# Patient Record
Sex: Female | Born: 1950 | Race: Black or African American | Hispanic: No | Smoking: Former smoker
Health system: Southern US, Community
[De-identification: ages and names within clinical notes are randomized; demographics above are authoritative.]

## PROBLEM LIST (undated history)

## (undated) DIAGNOSIS — K635 Polyp of colon: Secondary | ICD-10-CM

## (undated) DIAGNOSIS — J45990 Exercise induced bronchospasm: Secondary | ICD-10-CM

## (undated) DIAGNOSIS — K219 Gastro-esophageal reflux disease without esophagitis: Secondary | ICD-10-CM

## (undated) DIAGNOSIS — E039 Hypothyroidism, unspecified: Secondary | ICD-10-CM

## (undated) DIAGNOSIS — J189 Pneumonia, unspecified organism: Secondary | ICD-10-CM

## (undated) DIAGNOSIS — K222 Esophageal obstruction: Secondary | ICD-10-CM

## (undated) HISTORY — DX: Polyp of colon: K63.5

## (undated) HISTORY — DX: Gastro-esophageal reflux disease without esophagitis: K21.9

## (undated) HISTORY — DX: Exercise induced bronchospasm: J45.990

## (undated) HISTORY — DX: Esophageal obstruction: K22.2

## (undated) HISTORY — DX: Hypothyroidism, unspecified: E03.9

## (undated) HISTORY — DX: Pneumonia, unspecified organism: J18.9

## (undated) HISTORY — PX: TUBAL LIGATION: SHX77

## (undated) HISTORY — PX: ESOPHAGEAL DILATION: SHX303

---

## 1998-01-29 ENCOUNTER — Ambulatory Visit (HOSPITAL_COMMUNITY): Admission: RE | Admit: 1998-01-29 | Discharge: 1998-01-29 | Payer: Self-pay | Admitting: *Deleted

## 1998-02-18 ENCOUNTER — Ambulatory Visit (HOSPITAL_COMMUNITY): Admission: RE | Admit: 1998-02-18 | Discharge: 1998-02-18 | Payer: Self-pay | Admitting: *Deleted

## 1998-05-15 ENCOUNTER — Inpatient Hospital Stay (HOSPITAL_COMMUNITY): Admission: EM | Admit: 1998-05-15 | Discharge: 1998-05-18 | Payer: Self-pay | Admitting: Emergency Medicine

## 1998-08-20 ENCOUNTER — Ambulatory Visit (HOSPITAL_COMMUNITY): Admission: RE | Admit: 1998-08-20 | Discharge: 1998-08-20 | Payer: Self-pay | Admitting: *Deleted

## 1999-07-01 ENCOUNTER — Ambulatory Visit (HOSPITAL_COMMUNITY): Admission: RE | Admit: 1999-07-01 | Discharge: 1999-07-01 | Payer: Self-pay | Admitting: *Deleted

## 1999-07-01 ENCOUNTER — Encounter: Payer: Self-pay | Admitting: *Deleted

## 1999-10-25 ENCOUNTER — Other Ambulatory Visit: Admission: RE | Admit: 1999-10-25 | Discharge: 1999-10-25 | Payer: Self-pay | Admitting: *Deleted

## 2000-07-07 ENCOUNTER — Ambulatory Visit (HOSPITAL_COMMUNITY): Admission: RE | Admit: 2000-07-07 | Discharge: 2000-07-07 | Payer: Self-pay | Admitting: *Deleted

## 2000-07-07 ENCOUNTER — Encounter: Payer: Self-pay | Admitting: *Deleted

## 2001-02-16 ENCOUNTER — Other Ambulatory Visit: Admission: RE | Admit: 2001-02-16 | Discharge: 2001-02-16 | Payer: Self-pay | Admitting: *Deleted

## 2001-02-21 ENCOUNTER — Ambulatory Visit (HOSPITAL_COMMUNITY): Admission: RE | Admit: 2001-02-21 | Discharge: 2001-02-21 | Payer: Self-pay | Admitting: Gastroenterology

## 2001-02-21 ENCOUNTER — Encounter: Payer: Self-pay | Admitting: Gastroenterology

## 2002-02-07 ENCOUNTER — Encounter: Payer: Self-pay | Admitting: Internal Medicine

## 2002-02-07 ENCOUNTER — Encounter: Admission: RE | Admit: 2002-02-07 | Discharge: 2002-02-07 | Payer: Self-pay | Admitting: Internal Medicine

## 2002-07-31 ENCOUNTER — Ambulatory Visit (HOSPITAL_COMMUNITY): Admission: RE | Admit: 2002-07-31 | Discharge: 2002-07-31 | Payer: Self-pay | Admitting: *Deleted

## 2002-07-31 ENCOUNTER — Encounter: Payer: Self-pay | Admitting: *Deleted

## 2002-09-06 ENCOUNTER — Encounter (INDEPENDENT_AMBULATORY_CARE_PROVIDER_SITE_OTHER): Payer: Self-pay | Admitting: Specialist

## 2002-09-06 ENCOUNTER — Encounter: Payer: Self-pay | Admitting: Gastroenterology

## 2002-09-06 ENCOUNTER — Ambulatory Visit (HOSPITAL_COMMUNITY): Admission: RE | Admit: 2002-09-06 | Discharge: 2002-09-06 | Payer: Self-pay | Admitting: Gastroenterology

## 2002-09-18 ENCOUNTER — Other Ambulatory Visit: Admission: RE | Admit: 2002-09-18 | Discharge: 2002-09-18 | Payer: Self-pay | Admitting: *Deleted

## 2003-08-04 ENCOUNTER — Ambulatory Visit (HOSPITAL_COMMUNITY): Admission: RE | Admit: 2003-08-04 | Discharge: 2003-08-04 | Payer: Self-pay | Admitting: *Deleted

## 2003-11-20 ENCOUNTER — Other Ambulatory Visit: Admission: RE | Admit: 2003-11-20 | Discharge: 2003-11-20 | Payer: Self-pay | Admitting: *Deleted

## 2004-02-18 ENCOUNTER — Encounter: Admission: RE | Admit: 2004-02-18 | Discharge: 2004-02-18 | Payer: Self-pay | Admitting: Internal Medicine

## 2004-08-20 ENCOUNTER — Ambulatory Visit (HOSPITAL_COMMUNITY): Admission: RE | Admit: 2004-08-20 | Discharge: 2004-08-20 | Payer: Self-pay | Admitting: Internal Medicine

## 2005-01-10 ENCOUNTER — Other Ambulatory Visit: Admission: RE | Admit: 2005-01-10 | Discharge: 2005-01-10 | Payer: Self-pay | Admitting: Obstetrics and Gynecology

## 2005-01-12 ENCOUNTER — Encounter: Admission: RE | Admit: 2005-01-12 | Discharge: 2005-01-12 | Payer: Self-pay | Admitting: Internal Medicine

## 2005-01-20 ENCOUNTER — Ambulatory Visit (HOSPITAL_COMMUNITY): Admission: RE | Admit: 2005-01-20 | Discharge: 2005-01-20 | Payer: Self-pay | Admitting: *Deleted

## 2005-08-22 ENCOUNTER — Ambulatory Visit (HOSPITAL_COMMUNITY): Admission: RE | Admit: 2005-08-22 | Discharge: 2005-08-22 | Payer: Self-pay | Admitting: *Deleted

## 2005-11-30 ENCOUNTER — Encounter: Payer: Self-pay | Admitting: *Deleted

## 2005-12-20 ENCOUNTER — Emergency Department (HOSPITAL_COMMUNITY): Admission: EM | Admit: 2005-12-20 | Discharge: 2005-12-20 | Payer: Self-pay | Admitting: Emergency Medicine

## 2006-03-01 ENCOUNTER — Ambulatory Visit: Payer: Self-pay | Admitting: Pulmonary Disease

## 2006-03-29 ENCOUNTER — Ambulatory Visit: Payer: Self-pay | Admitting: Pulmonary Disease

## 2006-06-16 ENCOUNTER — Ambulatory Visit: Payer: Self-pay | Admitting: Pulmonary Disease

## 2006-08-24 ENCOUNTER — Ambulatory Visit (HOSPITAL_COMMUNITY): Admission: RE | Admit: 2006-08-24 | Discharge: 2006-08-24 | Payer: Self-pay | Admitting: Internal Medicine

## 2006-10-04 ENCOUNTER — Ambulatory Visit: Payer: Self-pay | Admitting: Pulmonary Disease

## 2006-11-06 ENCOUNTER — Ambulatory Visit: Payer: Self-pay | Admitting: Pulmonary Disease

## 2007-01-18 ENCOUNTER — Encounter: Admission: RE | Admit: 2007-01-18 | Discharge: 2007-01-18 | Payer: Self-pay | Admitting: Internal Medicine

## 2007-02-06 ENCOUNTER — Encounter: Admission: RE | Admit: 2007-02-06 | Discharge: 2007-02-06 | Payer: Self-pay | Admitting: Internal Medicine

## 2007-02-09 ENCOUNTER — Ambulatory Visit: Payer: Self-pay | Admitting: Pulmonary Disease

## 2007-02-12 ENCOUNTER — Ambulatory Visit: Payer: Self-pay | Admitting: Pulmonary Disease

## 2007-02-12 ENCOUNTER — Ambulatory Visit: Payer: Self-pay | Admitting: Internal Medicine

## 2007-02-23 ENCOUNTER — Encounter: Admission: RE | Admit: 2007-02-23 | Discharge: 2007-02-23 | Payer: Self-pay | Admitting: Internal Medicine

## 2007-02-26 ENCOUNTER — Ambulatory Visit: Payer: Self-pay | Admitting: Pulmonary Disease

## 2007-03-06 ENCOUNTER — Encounter: Admission: RE | Admit: 2007-03-06 | Discharge: 2007-03-06 | Payer: Self-pay | Admitting: Obstetrics and Gynecology

## 2007-05-04 ENCOUNTER — Ambulatory Visit: Payer: Self-pay | Admitting: Pulmonary Disease

## 2007-05-04 DIAGNOSIS — J309 Allergic rhinitis, unspecified: Secondary | ICD-10-CM | POA: Insufficient documentation

## 2007-05-04 DIAGNOSIS — M810 Age-related osteoporosis without current pathological fracture: Secondary | ICD-10-CM | POA: Insufficient documentation

## 2007-09-03 ENCOUNTER — Ambulatory Visit (HOSPITAL_COMMUNITY): Admission: RE | Admit: 2007-09-03 | Discharge: 2007-09-03 | Payer: Self-pay | Admitting: Orthopedic Surgery

## 2007-09-03 ENCOUNTER — Ambulatory Visit: Payer: Self-pay | Admitting: Pulmonary Disease

## 2007-09-03 DIAGNOSIS — K219 Gastro-esophageal reflux disease without esophagitis: Secondary | ICD-10-CM | POA: Insufficient documentation

## 2007-09-20 ENCOUNTER — Telehealth (INDEPENDENT_AMBULATORY_CARE_PROVIDER_SITE_OTHER): Payer: Self-pay | Admitting: *Deleted

## 2007-10-24 ENCOUNTER — Ambulatory Visit: Payer: Self-pay | Admitting: Gastroenterology

## 2007-11-15 ENCOUNTER — Ambulatory Visit: Payer: Self-pay | Admitting: Gastroenterology

## 2007-11-21 ENCOUNTER — Ambulatory Visit: Payer: Self-pay | Admitting: Gastroenterology

## 2007-12-14 ENCOUNTER — Telehealth (INDEPENDENT_AMBULATORY_CARE_PROVIDER_SITE_OTHER): Payer: Self-pay | Admitting: *Deleted

## 2008-01-30 ENCOUNTER — Ambulatory Visit: Payer: Self-pay | Admitting: Pulmonary Disease

## 2008-01-30 DIAGNOSIS — J449 Chronic obstructive pulmonary disease, unspecified: Secondary | ICD-10-CM | POA: Insufficient documentation

## 2008-01-31 ENCOUNTER — Telehealth (INDEPENDENT_AMBULATORY_CARE_PROVIDER_SITE_OTHER): Payer: Self-pay | Admitting: *Deleted

## 2008-02-28 ENCOUNTER — Ambulatory Visit: Payer: Self-pay | Admitting: Pulmonary Disease

## 2008-03-04 ENCOUNTER — Telehealth (INDEPENDENT_AMBULATORY_CARE_PROVIDER_SITE_OTHER): Payer: Self-pay | Admitting: *Deleted

## 2008-03-06 ENCOUNTER — Telehealth (INDEPENDENT_AMBULATORY_CARE_PROVIDER_SITE_OTHER): Payer: Self-pay | Admitting: *Deleted

## 2008-05-06 ENCOUNTER — Ambulatory Visit: Payer: Self-pay | Admitting: Pulmonary Disease

## 2008-06-17 ENCOUNTER — Telehealth: Payer: Self-pay | Admitting: Pulmonary Disease

## 2008-06-23 ENCOUNTER — Encounter: Payer: Self-pay | Admitting: Pulmonary Disease

## 2008-07-16 ENCOUNTER — Telehealth: Payer: Self-pay | Admitting: Pulmonary Disease

## 2008-07-28 ENCOUNTER — Ambulatory Visit: Payer: Self-pay | Admitting: Pulmonary Disease

## 2008-08-10 ENCOUNTER — Encounter: Payer: Self-pay | Admitting: Pulmonary Disease

## 2008-08-14 ENCOUNTER — Ambulatory Visit: Payer: Self-pay | Admitting: Pulmonary Disease

## 2008-08-29 ENCOUNTER — Ambulatory Visit: Payer: Self-pay | Admitting: Pulmonary Disease

## 2008-09-10 ENCOUNTER — Ambulatory Visit (HOSPITAL_COMMUNITY): Admission: RE | Admit: 2008-09-10 | Discharge: 2008-09-10 | Payer: Self-pay | Admitting: Internal Medicine

## 2008-09-11 ENCOUNTER — Ambulatory Visit: Payer: Self-pay | Admitting: Pulmonary Disease

## 2008-09-25 ENCOUNTER — Ambulatory Visit: Payer: Self-pay | Admitting: Pulmonary Disease

## 2008-10-06 ENCOUNTER — Telehealth: Payer: Self-pay | Admitting: Pulmonary Disease

## 2008-10-14 ENCOUNTER — Ambulatory Visit: Payer: Self-pay | Admitting: Pulmonary Disease

## 2008-10-28 ENCOUNTER — Ambulatory Visit: Payer: Self-pay | Admitting: Pulmonary Disease

## 2008-11-11 ENCOUNTER — Ambulatory Visit: Payer: Self-pay | Admitting: Pulmonary Disease

## 2008-12-04 ENCOUNTER — Ambulatory Visit: Payer: Self-pay | Admitting: Pulmonary Disease

## 2008-12-18 ENCOUNTER — Ambulatory Visit: Payer: Self-pay | Admitting: Pulmonary Disease

## 2009-01-01 ENCOUNTER — Ambulatory Visit: Payer: Self-pay | Admitting: Pulmonary Disease

## 2009-01-07 ENCOUNTER — Telehealth (INDEPENDENT_AMBULATORY_CARE_PROVIDER_SITE_OTHER): Payer: Self-pay | Admitting: *Deleted

## 2009-01-12 ENCOUNTER — Ambulatory Visit: Payer: Self-pay | Admitting: Pulmonary Disease

## 2009-02-16 ENCOUNTER — Ambulatory Visit: Payer: Self-pay | Admitting: Pulmonary Disease

## 2009-03-24 ENCOUNTER — Ambulatory Visit: Payer: Self-pay | Admitting: Pulmonary Disease

## 2009-04-14 ENCOUNTER — Telehealth: Payer: Self-pay | Admitting: Pulmonary Disease

## 2009-04-15 ENCOUNTER — Ambulatory Visit: Payer: Self-pay | Admitting: Pulmonary Disease

## 2009-04-29 ENCOUNTER — Ambulatory Visit: Payer: Self-pay | Admitting: Pulmonary Disease

## 2009-05-13 ENCOUNTER — Ambulatory Visit: Payer: Self-pay | Admitting: Pulmonary Disease

## 2009-07-08 ENCOUNTER — Encounter: Payer: Self-pay | Admitting: Pulmonary Disease

## 2009-09-08 ENCOUNTER — Ambulatory Visit: Payer: Self-pay | Admitting: Pulmonary Disease

## 2009-10-15 ENCOUNTER — Ambulatory Visit (HOSPITAL_COMMUNITY): Admission: RE | Admit: 2009-10-15 | Discharge: 2009-10-15 | Payer: Self-pay | Admitting: Obstetrics and Gynecology

## 2009-12-21 ENCOUNTER — Telehealth: Payer: Self-pay | Admitting: Pulmonary Disease

## 2009-12-31 ENCOUNTER — Ambulatory Visit: Payer: Self-pay | Admitting: Pulmonary Disease

## 2010-02-05 ENCOUNTER — Ambulatory Visit: Payer: Self-pay | Admitting: Pulmonary Disease

## 2010-02-05 ENCOUNTER — Encounter: Payer: Self-pay | Admitting: Pulmonary Disease

## 2010-02-11 ENCOUNTER — Telehealth: Payer: Self-pay | Admitting: Pulmonary Disease

## 2010-02-11 ENCOUNTER — Encounter: Payer: Self-pay | Admitting: Pulmonary Disease

## 2010-02-22 ENCOUNTER — Ambulatory Visit: Payer: Self-pay | Admitting: Pulmonary Disease

## 2010-02-24 ENCOUNTER — Telehealth: Payer: Self-pay | Admitting: Pulmonary Disease

## 2010-07-27 ENCOUNTER — Telehealth (INDEPENDENT_AMBULATORY_CARE_PROVIDER_SITE_OTHER): Payer: Self-pay | Admitting: *Deleted

## 2010-08-12 ENCOUNTER — Ambulatory Visit
Admission: RE | Admit: 2010-08-12 | Discharge: 2010-08-12 | Payer: Self-pay | Source: Home / Self Care | Attending: Pulmonary Disease | Admitting: Pulmonary Disease

## 2010-08-22 ENCOUNTER — Encounter: Payer: Self-pay | Admitting: Obstetrics and Gynecology

## 2010-08-22 ENCOUNTER — Encounter: Payer: Self-pay | Admitting: Internal Medicine

## 2010-08-31 NOTE — Miscellaneous (Signed)
Summary: Orders Update pft charges  Clinical Lists Changes  Orders: Added new Service order of Carbon Monoxide diffusing w/capacity (94720) - Signed Added new Service order of Lung Volumes (94240) - Signed Added new Service order of Spirometry (Pre & Post) (94060) - Signed 

## 2010-08-31 NOTE — Progress Notes (Signed)
Summary: samples  Phone Note Call from Patient Call back at Home Phone [phone] Call back at [phone]   Caller: Patient Call For: Arretta Toenjes Summary of Call: Needs samples of symbicort 160-4.55mcg. Initial call taken by: Darletta Moll,  Dec 21, 2009 9:26 AM  Follow-up for Phone Call        pt advised 2 samples at front. Carron Curie CMA  Dec 21, 2009 10:26 AM

## 2010-08-31 NOTE — Assessment & Plan Note (Signed)
Summary: discuss pft results/treatment/la   Visit Type:  Follow-up Primary Provider/Referring Provider:  Allena Napoleon  CC:  COPD. Patient here to discuss PFT results.  No changes in her breathin.Marland Kitchen  History of Present Illness: 60 yo female with chronic obstructive asthma, allergic rhinitis, and exercise induced bronchoconstriction.  Her IgE level was 327.6 from 02/28/08 with multiple allergens.  She did not notice much improvement after using prednisone.  She feels her breathing is about the same.  She has been using her rescue inhaler more often, but doesn't feel like it helps as much as before.  She has a cough with sputum.  She denies hemoptysis or fever.  Her sinuses have been okay.   Current Medications (verified): 1)  Spiriva Handihaler 18 Mcg  Caps (Tiotropium Bromide Monohydrate) .... Inhale Contents of 1 Capsule Once A Day 2)  Symbicort 160-4.5 Mcg/act  Aero (Budesonide-Formoterol Fumarate) .... Inhale 2 Puffs Two Times A Day 3)  Singulair 10 Mg  Tabs (Montelukast Sodium) .... Take 1 Tablet By Mouth Once A Day 4)  Proair Hfa 108 (90 Base) Mcg/act Aers (Albuterol Sulfate) .... Two Puffs Upto Four Times Per Day As Needed 5)  Prilosec 20 Mg  Cpdr (Omeprazole) .Marland Kitchen.. 1 By Mouth Two Times A Day 6)  Epipen 0.3 Mg/0.66ml (1:1000) Devi (Epinephrine Hcl (Anaphylaxis)) .... Take As Directed As Needed For Severe Allergic Reaction 7)  Multivitamins  Tabs (Multiple Vitamin) .Marland Kitchen.. 1 By Mouth Daily 8)  Synthroid .Marland Kitchen.. 1 By Mouth Daily  Allergies (verified): No Known Drug Allergies  Past History:  Past Medical History: Chronic obstructive asthma      - IgE 327.6      - PFT 02/28/08 FEV1 1.44(58%), FEV1% 48, TLC 5.25(99%), DLCO 76%      - PFT 02/05/10 FEV1 1.44(60%), FEV1% 51, TLC 5.22(99%), DLCO 78%, +BD      - Started xolair 08/14/08 and stopped 09/10 (pt intolerant of medication) Allergic rhinitis Exercise induced bronchocontriction GERD Osteoporosis Esophageal stricture Colon  polyp Pneumonia  Past Surgical History: Reviewed history from 01/30/2008 and no changes required. Esophageal dilation Tubal ligation  Vital Signs:  Patient profile:   60 year old female Height:      66 inches (167.64 cm) Weight:      180 pounds (81.82 kg) BMI:     29.16 O2 Sat:      98 % on Room air Temp:     98.1 degrees F (36.72 degrees C) oral Pulse rate:   77 / minute BP sitting:   118 / 78  (left arm) Cuff size:   regular  Vitals Entered By: Michel Bickers CMA (February 22, 2010 12:17 PM)  O2 Sat at Rest %:  98 O2 Flow:  Room air CC: COPD. Patient here to discuss PFT results.  No changes in her breathin. Comments Medications reviewed with the patient. Daytime phone verified. Michel Bickers [facility]  February 22, 2010 12:19 PM   Physical Exam  General:  normal appearance.   Nose:  no nasal discharge Mouth:  MP 2, scalloped borders, decreased AP diameter of oropharynx, low laying soft palate Neck:  no LAN Lungs:  no wheezing or rales Heart:  regular rhythm, normal rate, and no murmurs.   Extremities:  no edema Cervical Nodes:  no significant adenopathy   Impression & Recommendations:  Problem # 1:  CHRONIC OBSTRUCTIVE ASTHMA UNSPECIFIED (ICD-493.20) Will add theophylline to her current regimen.  May need to consider changing to nebulized medication if no improvement.  She is to  call if no help from theophylline or if she gets side effects.  Problem # 2:  ALLERGIC RHINITIS (ICD-477.9) Stable.  Problem # 3:  HYPERSOMNIA UNSPECIFIED (ICD-780.54)  She continues to have problems with feeling fatigued and sleepy during the day.  Will address further at her next visit.  Medications Added to Medication List This Visit: 1)  Theo-24 100 Mg Xr24h-cap (Theophylline) .... One by mouth once daily  Complete Medication List: 1)  Spiriva Handihaler 18 Mcg Caps (Tiotropium bromide monohydrate) .... Inhale contents of 1 capsule once a day 2)  Symbicort 160-4.5 Mcg/act Aero  (Budesonide-formoterol fumarate) .... Inhale 2 puffs two times a day 3)  Theo-24 100 Mg Xr24h-cap (Theophylline) .... One by mouth once daily 4)  Singulair 10 Mg Tabs (Montelukast sodium) .... Take 1 tablet by mouth once a day 5)  Proair Hfa 108 (90 Base) Mcg/act Aers (Albuterol sulfate) .... Two puffs upto four times per day as needed 6)  Prilosec 20 Mg Cpdr (Omeprazole) .Marland Kitchen.. 1 by mouth two times a day 7)  Epipen 0.3 Mg/0.32ml (1:1000) Devi (Epinephrine hcl (anaphylaxis)) .... Take as directed as needed for severe allergic reaction 8)  Multivitamins Tabs (Multiple vitamin) .Marland Kitchen.. 1 by mouth daily 9)  Synthroid  .Marland Kitchen.. 1 by mouth daily  Other Orders: Est. Patient Level III (16109)  Patient Instructions: 1)  Theo-24 one pill once daily  2)  Follow up in 4 months Prescriptions: THEO-24 100 MG XR24H-CAP (THEOPHYLLINE) one by mouth once daily  #30 x 6   Entered and Authorized by:   Coralyn Helling MD   Signed by:   Coralyn Helling MD on 02/22/2010   Method used:   Electronically to        [facility] #[id number]* (retail)       [address]       [place], [place]  [address]       Ph: [phone]       Fax: [phone]   RxID:   [id number]

## 2010-08-31 NOTE — Progress Notes (Signed)
Summary: request for med change  Phone Note From Pharmacy   Caller: [facility] #[id number]* Call For: Dr. Craige Cotta  Summary of Call: Received fax from pharmacy with the following message: "***Mary [name] has requested a less expensive alternative to Theo-24. She inquired about generic theophylline 12 hour. She is willing to take more tabs/day to save money. Please reply at your earliest convenience. Thank you.***"  For Rx Theo-24 100mg  ER capsules, Take 1 capsule by mouth once a day   [facility] [address] phone [phone], fax [phone] Initial call taken by: Zackery Barefoot CMA,  February 24, 2010 12:18 PM  Follow-up for Phone Call        Please change to theophylline 100mg  by mouth two times a day, dispense 60 with 4 refills. Follow-up by: Coralyn Helling MD,  March 03, 2010 8:47 AM    New/Updated Medications: THEOPHYLLINE CR 100 MG XR12H-TAB (THEOPHYLLINE) one by mouth two times a day Prescriptions: THEOPHYLLINE CR 100 MG XR12H-TAB (THEOPHYLLINE) one by mouth two times a day  #60 x 4   Entered and Authorized by:   Coralyn Helling MD   Signed by:   Coralyn Helling MD on 03/03/2010   Method used:   Electronically to        [facility] #[id number]* (retail)       [address]       [place], [place]  [address]       Ph: [phone]       Fax: [phone]   RxID:   [id number]

## 2010-08-31 NOTE — Assessment & Plan Note (Signed)
Summary: rov/apc   Primary Provider/Referring Provider:  Allena Napoleon  CC:  Asthma. Patient states she is having no problems with her breathing.Marland Kitchen  History of Present Illness: I saw Mary [name] today in follow up for her chronic obstructive asthma, allergic rhinitis, and exercise induced bronchoconstriction.  Her IgE level was 327.6 from 02/28/08 with multiple allergens.  She stopped xolair in September.  She was not consistent with her dosing prior to this.  She felt that her breathing was actually worse when she got her xolair shots.  She has been doing reasonably well otherwise.  She uses her proair about 4 or 5 times per week.  She has a cough with clear sputum.  Her sinuses are okay.  She intermittently uses mucinex for chest congestion.  Current Medications (verified): 1)  Spiriva Handihaler 18 Mcg  Caps (Tiotropium Bromide Monohydrate) .... Inhale Contents of 1 Capsule Once A Day 2)  Symbicort 160-4.5 Mcg/act  Aero (Budesonide-Formoterol Fumarate) .... Inhale 2 Puffs Two Times A Day 3)  Singulair 10 Mg  Tabs (Montelukast Sodium) .... Take 1 Tablet By Mouth Once A Day 4)  Proair Hfa 108 (90 Base) Mcg/act Aers (Albuterol Sulfate) .... Two Puffs Upto Four Times Per Day As Needed 5)  Prilosec 20 Mg  Cpdr (Omeprazole) .Marland Kitchen.. 1 By Mouth Two Times A Day 6)  Forteo 600 Mcg/2.32ml Soln (Teriparatide (Recombinant)) .Marland Kitchen.. 1 Injection At Bedtime 7)  Epipen 0.3 Mg/0.55ml (1:1000) Devi (Epinephrine Hcl (Anaphylaxis)) .... Take As Directed As Needed For Severe Allergic Reaction 8)  Multivitamins  Tabs (Multiple Vitamin) .Marland Kitchen.. 1 By Mouth Daily 9)  Synthroid .Marland Kitchen.. 1 By Mouth Daily  Allergies (verified): No Known Drug Allergies  Past History:  Past Medical History: Chronic obstructive asthma      - IgE 327.6      - PFT 02/28/08 FEV1 1.44 (58%), FVC 3.00 (90%), FEV1% 48, TLC 5.25 (99%), DLCO 76%      - Started xolair 08/14/08 and stopped 09/10 (pt intolerant of medication) Allergic  rhinitis Exercise induced bronchocontriction GERD Osteoporosis Esophageal stricture Colon polyp Pneumonia  Past Surgical History: Reviewed history from 01/30/2008 and no changes required. Esophageal dilation Tubal ligation  Vital Signs:  Patient profile:   60 year old female Height:      66 inches (167.64 cm) Weight:      178.25 pounds (81.02 kg) BMI:     28.87 O2 Sat:      100 % on Room air Temp:     97.8 degrees F (36.56 degrees C) oral Pulse rate:   74 / minute BP sitting:   116 / 80  (left arm) Cuff size:   regular  Vitals Entered By: Michel Bickers CMA (September 08, 2009 3:43 PM)  O2 Sat at Rest %:  100 O2 Flow:  Room air  Physical Exam  General:  normal appearance.   Nose:  no nasal discharge Mouth:  MP 2, scalloped borders, decreased AP diameter of oropharynx, low laying soft palate Neck:  no LAN Lungs:  no wheezing or rales Heart:  regular rhythm, normal rate, and no murmurs.   Abdomen:  thin, soft, nontender Extremities:  no edema   Impression & Recommendations:  Problem # 1:  CHRONIC OBSTRUCTIVE ASTHMA UNSPECIFIED (ICD-493.20) She has not been able to tolerate xolair.  Will continue her current inhaler regimen.  Will change her to ventolin as this has a dose counter, so she can keep track of how much medicine she has left.  Medications Added to  Medication List This Visit: 1)  Multivitamins Tabs (Multiple vitamin) .Marland Kitchen.. 1 by mouth daily 2)  Synthroid  .Marland Kitchen.. 1 by mouth daily 3)  Ventolin Hfa 108 (90 Base) Mcg/act Aers (Albuterol sulfate) .... Two puffs up to four times per day as needed  Complete Medication List: 1)  Spiriva Handihaler 18 Mcg Caps (Tiotropium bromide monohydrate) .... Inhale contents of 1 capsule once a day 2)  Symbicort 160-4.5 Mcg/act Aero (Budesonide-formoterol fumarate) .... Inhale 2 puffs two times a day 3)  Singulair 10 Mg Tabs (Montelukast sodium) .... Take 1 tablet by mouth once a day 4)  Proair Hfa 108 (90 Base) Mcg/act Aers  (Albuterol sulfate) .... Two puffs upto four times per day as needed 5)  Prilosec 20 Mg Cpdr (Omeprazole) .Marland Kitchen.. 1 by mouth two times a day 6)  Forteo 600 Mcg/2.37ml Soln (Teriparatide (recombinant)) .Marland Kitchen.. 1 injection at bedtime 7)  Epipen 0.3 Mg/0.51ml (1:1000) Devi (Epinephrine hcl (anaphylaxis)) .... Take as directed as needed for severe allergic reaction 8)  Multivitamins Tabs (Multiple vitamin) .Marland Kitchen.. 1 by mouth daily 9)  Synthroid  .Marland Kitchen.. 1 by mouth daily 10)  Ventolin Hfa 108 (90 Base) Mcg/act Aers (Albuterol sulfate) .... Two puffs up to four times per day as needed  Other Orders: Est. Patient Level III (16109)  Patient Instructions: 1)  Change proair to ventolin 2 puffs up to four times per day 2)  Follow up in 6 months Prescriptions: VENTOLIN HFA 108 (90 BASE) MCG/ACT AERS (ALBUTEROL SULFATE) two puffs up to four times per day as needed  #1 x 6   Entered and Authorized by:   Coralyn Helling MD   Signed by:   Coralyn Helling MD on 09/08/2009   Method used:   Electronically to        [facility] #[id number]* (retail)       [address]       [place], [place]  [address]       Ph: [phone]       Fax: [phone]   RxID:   [id number] SINGULAIR 10 MG  TABS (MONTELUKAST SODIUM) Take 1 tablet by mouth once a day  #30 x 6   Entered and Authorized by:   Coralyn Helling MD   Signed by:   Coralyn Helling MD on 09/08/2009   Method used:   Electronically to        [facility] #[id number]* (retail)       [address]       [place], [place]  [address]       Ph: [phone]       Fax: [phone]   RxID:   [id number] SYMBICORT 160-4.5 MCG/ACT  AERO (BUDESONIDE-FORMOTEROL FUMARATE) Inhale 2 puffs two times a day  #1 x 6   Entered and Authorized by:   Coralyn Helling MD   Signed by:   Coralyn Helling MD on 09/08/2009   Method used:   Electronically to        [facility] #[id number]* (retail)       [address]  [address]       [place], [place]  [address]       Ph: [phone]       Fax: [phone]   RxID:   [id number] SPIRIVA HANDIHALER 18 MCG  CAPS (TIOTROPIUM BROMIDE MONOHYDRATE) Inhale  contents of 1 capsule once a day  #30 x 6   Entered and Authorized by:   Coralyn Helling MD   Signed by:   Coralyn Helling MD on 09/08/2009   Method used:   Electronically to        [facility] #[id number]* (retail)       [address]       [place], [place]  [address]       Ph: [phone]       Fax: [phone]   RxID:   [id number]

## 2010-08-31 NOTE — Miscellaneous (Signed)
Summary: Pulmonary function test   Pulmonary Function Test Date: 02/05/2010 Height (in.): 66 Gender: Female  Pre-Spirometry FVC    Value: 2.56 L/min   Pred: 3.27 L/min     % Pred: 78 % FEV1    Value: 1.33 L     Pred: 2.42 L     % Pred: 55 % FEV1/FVC  Value: 52 %     Pred: 73 %     % Pred: . % FEF 25-75  Value: 0.26 L/min   Pred: 2.68 L/min     % Pred: 10 %  Post-Spirometry FVC    Value: 2.84 L/min   Pred: 3.27 L/min     % Pred: 87 % FEV1    Value: 1.44 L     Pred: 2.42 L     % Pred: 60 % FEV1/FVC  Value: 51 %     Pred: 73 %     % Pred: . % FEF 25-75  Value: 0.36 L/min   Pred: 2.68 L/min     % Pred: 13 %  Lung Volumes TLC    Value: 5.22 L   % Pred: 99 % RV    Value: 2.67 L   % Pred: 132 % DLCO    Value: 18.0 %   % Pred: 78 % DLCO/VA  Value: 4.17 %   % Pred: 113 %  Comments: Moderate obstruction.  Positive bronchodilator response.  Increased RV suggestive of airtrapping.  Otherwise normal lung volumes.  Mild diffusion defect.  Clinical Lists Changes  Observations: Added new observation of PFT COMMENTS: Moderate obstruction.  Positive bronchodilator response.  Increased RV suggestive of airtrapping.  Otherwise normal lung volumes.  Mild diffusion defect. (02/05/2010 15:00) Added new observation of DLCO/VA%EXP: 113 % (02/05/2010 15:00) Added new observation of DLCO/VA: 4.17 % (02/05/2010 15:00) Added new observation of DLCO % EXPEC: 78 % (02/05/2010 15:00) Added new observation of DLCO: 18.0 % (02/05/2010 15:00) Added new observation of RV % EXPECT: 132 % (02/05/2010 15:00) Added new observation of RV: 2.67 L (02/05/2010 15:00) Added new observation of TLC % EXPECT: 99 % (02/05/2010 15:00) Added new observation of TLC: 5.22 L (02/05/2010 15:00) Added new observation of FEF2575%EXPS: 13 % (02/05/2010 15:00) Added new observation of PSTFEF25/75P: 2.68  (02/05/2010 15:00) Added new observation of PSTFEF25/75%: 0.36 L/min (02/05/2010 15:00) Added new observation of PSTFEV1/FCV%: .  % (02/05/2010 15:00) Added new observation of FEV1FVCPRDPS: 73 % (02/05/2010 15:00) Added new observation of PSTFEV1/FVC: 51 % (02/05/2010 15:00) Added new observation of POSTFEV1%PRD: 60 % (02/05/2010 15:00) Added new observation of FEV1PRDPST: 2.42 L (02/05/2010 15:00) Added new observation of POST FEV1: 1.44 L/min (02/05/2010 15:00) Added new observation of POST FVC%EXP: 87 % (02/05/2010 15:00) Added new observation of FVCPRDPST: 3.27 L/min (02/05/2010 15:00) Added new observation of POST FVC: 2.84 L (02/05/2010 15:00) Added new observation of FEF % EXPEC: 10 % (02/05/2010 15:00) Added new observation of FEF25-75%PRE: 2.68 L/min (02/05/2010 15:00) Added new observation of FEF 25-75%: 0.26 L/min (02/05/2010 15:00) Added new observation of FEV1/FVC%EXP: . % (02/05/2010 15:00) Added new observation of FEV1/FVC PRE: 73 % (02/05/2010 15:00) Added new observation of FEV1/FVC: 52 % (02/05/2010 15:00) Added new observation of FEV1 % EXP: 55 % (02/05/2010 15:00) Added new observation of FEV1 PREDICT: 2.42 L (02/05/2010 15:00) Added new observation of FEV1: 1.33 L (02/05/2010 15:00) Added new observation of FVC % EXPECT: 78 % (02/05/2010 15:00) Added new observation of FVC PREDICT: 3.27 L (02/05/2010 15:00) Added new observation of FVC: 2.56 L (02/05/2010  15:00) Added new observation of PFT HEIGHT: 66  (02/05/2010 15:00) Added new observation of PFT DATE: 02/05/2010  (02/05/2010 15:00)

## 2010-08-31 NOTE — Assessment & Plan Note (Signed)
Summary: rov after pft ///kp   Visit Type:  Follow-up Primary Provider/Referring Provider:  Allena Napoleon  CC:  Asthma follow-up with PFT. No complaints today.Marland Kitchen  History of Present Illness: 60 yo female with chronic obstructive asthma, allergic rhinitis, and exercise induced bronchoconstriction.  Her IgE level was 327.6 from 02/28/08 with multiple allergens.    Current Medications (verified): 1)  Spiriva Handihaler 18 Mcg  Caps (Tiotropium Bromide Monohydrate) .... Inhale Contents of 1 Capsule Once A Day 2)  Symbicort 160-4.5 Mcg/act  Aero (Budesonide-Formoterol Fumarate) .... Inhale 2 Puffs Two Times A Day 3)  Singulair 10 Mg  Tabs (Montelukast Sodium) .... Take 1 Tablet By Mouth Once A Day 4)  Proair Hfa 108 (90 Base) Mcg/act Aers (Albuterol Sulfate) .... Two Puffs Upto Four Times Per Day As Needed 5)  Prilosec 20 Mg  Cpdr (Omeprazole) .Marland Kitchen.. 1 By Mouth Two Times A Day 6)  Epipen 0.3 Mg/0.23ml (1:1000) Devi (Epinephrine Hcl (Anaphylaxis)) .... Take As Directed As Needed For Severe Allergic Reaction 7)  Multivitamins  Tabs (Multiple Vitamin) .Marland Kitchen.. 1 By Mouth Daily 8)  Synthroid .Marland Kitchen.. 1 By Mouth Daily  Allergies (verified): No Known Drug Allergies  Vital Signs:  Patient profile:   60 year old female Height:      66 inches (167.64 cm) Weight:      180 pounds (81.82 kg) BMI:     29.16 O2 Sat:      98 % on Room air Temp:     97.7 degrees F (36.50 degrees C) oral Pulse rate:   71 / minute BP sitting:   120 / 78  (left arm) Cuff size:   regular  Vitals Entered By: Michel Bickers CMA (February 05, 2010 4:05 PM)  O2 Sat at Rest %:  98 O2 Flow:  Room air CC: Asthma follow-up with PFT. No complaints today. Comments Medications reviewed. Daytime phone verified.Michel Bickers [facility]  February 05, 2010 4:10 PM    Impression & Recommendations:  Problem # 1:  CHRONIC OBSTRUCTIVE ASTHMA UNSPECIFIED (ICD-493.20) Pt could not wait for evaluation as she had other committment.  Will contact her once  PFT is reviewed.  Complete Medication List: 1)  Spiriva Handihaler 18 Mcg Caps (Tiotropium bromide monohydrate) .... Inhale contents of 1 capsule once a day 2)  Symbicort 160-4.5 Mcg/act Aero (Budesonide-formoterol fumarate) .... Inhale 2 puffs two times a day 3)  Singulair 10 Mg Tabs (Montelukast sodium) .... Take 1 tablet by mouth once a day 4)  Proair Hfa 108 (90 Base) Mcg/act Aers (Albuterol sulfate) .... Two puffs upto four times per day as needed 5)  Prilosec 20 Mg Cpdr (Omeprazole) .Marland Kitchen.. 1 by mouth two times a day 6)  Epipen 0.3 Mg/0.39ml (1:1000) Devi (Epinephrine hcl (anaphylaxis)) .... Take as directed as needed for severe allergic reaction 7)  Multivitamins Tabs (Multiple vitamin) .Marland Kitchen.. 1 by mouth daily 8)  Synthroid  .Marland Kitchen.. 1 by mouth daily  Other Orders: No Charge Patient Arrived (NCPA0) (NCPA0)

## 2010-08-31 NOTE — Progress Notes (Signed)
Summary: results  on PFTS  Phone Note Call from Patient Call back at [phone] or [phone]   Caller: Patient Call For: Jamill Wetmore Summary of Call: calling for pft results Initial call taken by: Rickard Patience,  February 11, 2010 10:58 AM  Follow-up for Phone Call        pt had PFTs scheduled for 02-05-2010 at 3pm.  Pt was supposed to see VS at 4pm to discuss these results but couldn't stay for the appt.  Pt calling today for these results.  Pt aware VS out of office this week.  Will forward message to VS to address. Arman Filter LPN  February 11, 2010 11:11 AM   Additional Follow-up for Phone Call Additional follow up Details #1::        Attempted to call pt at both listed numbers.  Was connected to voicemail with both numbers.  Will ask triage to contact pt to arrange for time and number that she can be reached at.  Preferrably she should have her appointment rescheduled to discuss PFT and treatment plan further. Additional Follow-up by: Coralyn Helling MD,  February 11, 2010 3:07 PM    Additional Follow-up for Phone Call Additional follow up Details #2::    called and spoke with pt and appt made for 7-25 at 11:45 to review results of pft and discuss treatment plan.   Randell Loop CMA  February 11, 2010 3:14 PM

## 2010-08-31 NOTE — Assessment & Plan Note (Signed)
Summary: rov/jd   Primary Provider/Referring Provider:  Allena Napoleon  CC:  Acute Visit.  Pt states she is having "shallow breathing" all the time, chest congestion, and prod cough "for months."  Pt states she does not know color or mucus.  .  History of Present Illness: 60 yo female with chronic obstructive asthma, allergic rhinitis, and exercise induced bronchoconstriction.  Her IgE level was 327.6 from 02/28/08 with multiple allergens.  She feels her breathing is getting more shallow, and she gets winded more easily with exertion.  She has a cough and chest congestion.  She is not having much wheeze.  Her sinuses and throat are okay.  She denies fever, or reflux.  She is not having leg swelling or gland swelling.  She is using her ventolin four times per day, and this helps.  Current Medications (verified): 1)  Spiriva Handihaler 18 Mcg  Caps (Tiotropium Bromide Monohydrate) .... Inhale Contents of 1 Capsule Once A Day 2)  Symbicort 160-4.5 Mcg/act  Aero (Budesonide-Formoterol Fumarate) .... Inhale 2 Puffs Two Times A Day 3)  Singulair 10 Mg  Tabs (Montelukast Sodium) .... Take 1 Tablet By Mouth Once A Day 4)  Proair Hfa 108 (90 Base) Mcg/act Aers (Albuterol Sulfate) .... Two Puffs Upto Four Times Per Day As Needed 5)  Prilosec 20 Mg  Cpdr (Omeprazole) .Marland Kitchen.. 1 By Mouth Two Times A Day 6)  Epipen 0.3 Mg/0.18ml (1:1000) Devi (Epinephrine Hcl (Anaphylaxis)) .... Take As Directed As Needed For Severe Allergic Reaction 7)  Multivitamins  Tabs (Multiple Vitamin) .Marland Kitchen.. 1 By Mouth Daily 8)  Synthroid .Marland Kitchen.. 1 By Mouth Daily  Allergies (verified): No Known Drug Allergies  Past History:  Past Medical History: Reviewed history from 09/08/2009 and no changes required. Chronic obstructive asthma      - IgE 327.6      - PFT 02/28/08 FEV1 1.44 (58%), FVC 3.00 (90%), FEV1% 48, TLC 5.25 (99%), DLCO 76%      - Started xolair 08/14/08 and stopped 09/10 (pt intolerant of medication) Allergic  rhinitis Exercise induced bronchocontriction GERD Osteoporosis Esophageal stricture Colon polyp Pneumonia  Past Surgical History: Reviewed history from 01/30/2008 and no changes required. Esophageal dilation Tubal ligation  Vital Signs:  Patient profile:   60 year old female Height:      66 inches Weight:      177 pounds BMI:     28.67 O2 Sat:      97 % on Room air Temp:     98.1 degrees F oral Pulse rate:   79 / minute BP sitting:   110 / 76  (right arm) Cuff size:   regular  Vitals Entered By: Gweneth Dimitri RN (December 31, 2009 11:16 AM)  O2 Flow:  Room air CC: Acute Visit.  Pt states she is having "shallow breathing" all the time, chest congestion, prod cough "for months."  Pt states she does not know color or mucus.   Comments Medications reviewed with patient Daytime contact number verified with patient. Gweneth Dimitri RN  December 31, 2009 11:17 AM    Physical Exam  General:  normal appearance.   Nose:  no nasal discharge Mouth:  MP 2, scalloped borders, decreased AP diameter of oropharynx, low laying soft palate Neck:  no LAN Lungs:  no wheezing or rales Heart:  regular rhythm, normal rate, and no murmurs.   Extremities:  no edema Cervical Nodes:  no significant adenopathy   Impression & Recommendations:  Problem # 1:  CHRONIC  OBSTRUCTIVE ASTHMA UNSPECIFIED (ICD-493.20) She has worsening symptoms of dyspnea.  My suspicion is that this is related to her asthma.  Will repeat her chest xray and PFT to exclude any other cause that could be contributing to her dyspnea symptoms.  She has been intolerate of xolair.  I will give her a short trial of prednisone.  I explained that a short course of prednisone should not have too much affect on her osteoporosis.  She is to continue her other inhalers, and singulair.  It may also be worthwhile to consider changing her to nebulized medication to see if changing the routine of medication administration will improve her symptom  control.  I don't think she needs antibiotics at this time.  May need to also consider referral to allergist.  Problem # 2:  HYPERSOMNIA UNSPECIFIED (ICD-780.54) She continues to have problems with feeling fatigued and sleepy during the day.  Will address further at her next visit.  Medications Added to Medication List This Visit: 1)  Prednisone 10 Mg Tabs (Prednisone) .... 3 pills for 2 days, 2 pills for 2 days, 1 pill for 2 days, 1/2 pill for 2 days  Complete Medication List: 1)  Spiriva Handihaler 18 Mcg Caps (Tiotropium bromide monohydrate) .... Inhale contents of 1 capsule once a day 2)  Symbicort 160-4.5 Mcg/act Aero (Budesonide-formoterol fumarate) .... Inhale 2 puffs two times a day 3)  Singulair 10 Mg Tabs (Montelukast sodium) .... Take 1 tablet by mouth once a day 4)  Proair Hfa 108 (90 Base) Mcg/act Aers (Albuterol sulfate) .... Two puffs upto four times per day as needed 5)  Prilosec 20 Mg Cpdr (Omeprazole) .Marland Kitchen.. 1 by mouth two times a day 6)  Epipen 0.3 Mg/0.74ml (1:1000) Devi (Epinephrine hcl (anaphylaxis)) .... Take as directed as needed for severe allergic reaction 7)  Multivitamins Tabs (Multiple vitamin) .Marland Kitchen.. 1 by mouth daily 8)  Synthroid  .Marland Kitchen.. 1 by mouth daily 9)  Prednisone 10 Mg Tabs (Prednisone) .... 3 pills for 2 days, 2 pills for 2 days, 1 pill for 2 days, 1/2 pill for 2 days  Other Orders: Est. Patient Level IV (01027) Prescription Created Electronically [phone]) Full Pulmonary Function Test (PFT) T-2 View CXR (71020TC)  Patient Instructions: 1)  Prednisone 10 mg pill: 3 pills for 2 days, 2 pills for 2 days, 1 pill for 2 days, 1/2 pill for 2 days 2)  Chest xray today 3)  Will schedule breathing test (PFT) 4)  Follow up in 3 to 4 weeks Prescriptions: PREDNISONE 10 MG TABS (PREDNISONE) 3 pills for 2 days, 2 pills for 2 days, 1 pill for 2 days, 1/2 pill for 2 days  #13 x 0   Entered and Authorized by:   Coralyn Helling MD   Signed by:   Coralyn Helling MD on 12/31/2009    Method used:   Electronically to        [facility] #[id number]* (retail)       [address]       [place], [place]  [address]       Ph: [phone]       Fax: [phone]   RxID:   [id number]

## 2010-09-02 NOTE — Assessment & Plan Note (Signed)
Summary: pt due/mhh   Visit Type:  Follow-up Primary Provider/Referring Provider:  Allena Napoleon  CC:  COPD follow-up...no complaints today.  History of Present Illness: 60 yo female with chronic obstructive asthma, allergic rhinitis, and exercise induced bronchoconstriction.  Her IgE level was 327.6 from 02/28/08 with multiple allergens.  Overall she has been doing okay.  She has occasional hoarseness and clears her throat.  She will get some breathing trouble in the late afternoon.  As a result she will use her symbicort about 5pm.  She is using her proair two times a day to three times a day.  She uses her spiriva at night.  She feels like she is able to function like she needs to on her current regimen.  She has not noticed any side effects from theophylline.    Preventive Screening-Counseling & Management  Alcohol-Tobacco     Smoking Status: never  Current Medications (verified): 1)  Spiriva Handihaler 18 Mcg  Caps (Tiotropium Bromide Monohydrate) .... Inhale Contents of 1 Capsule Once A Day 2)  Symbicort 160-4.5 Mcg/act  Aero (Budesonide-Formoterol Fumarate) .... Inhale 2 Puffs Two Times A Day 3)  Theophylline Cr 100 Mg Xr12h-Tab (Theophylline) .... One By Mouth Two Times A Day 4)  Singulair 10 Mg  Tabs (Montelukast Sodium) .... Take 1 Tablet By Mouth Once A Day 5)  Proair Hfa 108 (90 Base) Mcg/act Aers (Albuterol Sulfate) .... Two Puffs Upto Four Times Per Day As Needed 6)  Omeprazole 20 Mg Tbec (Omeprazole) .Marland Kitchen.. 1 Tablet By Mouth Before First and Last Meals of The Day 7)  Multivitamins  Tabs (Multiple Vitamin) .Marland Kitchen.. 1 By Mouth Daily 8)  Synthroid 88 Mcg Tabs (Levothyroxine Sodium) .Marland Kitchen.. 1 By Mouth Daily  Allergies (verified): No Known Drug Allergies  Past History:  Past Medical History: Chronic obstructive asthma      - IgE 327.6      - PFT 02/28/08 FEV1 1.44(58%), FEV1% 48, TLC 5.25(99%), DLCO 76%      - PFT 02/05/10 FEV1 1.44(60%), FEV1% 51, TLC 5.22(99%),  DLCO 78%, +BD      - Started xolair 08/14/08 and stopped 09/10 (pt intolerant of medication) Allergic rhinitis Exercise induced bronchocontriction GERD Osteoporosis Esophageal stricture Colon polyp Pneumonia Hypothyroidism  Past Surgical History: Reviewed history from 01/30/2008 and no changes required. Esophageal dilation Tubal ligation  Vital Signs:  Patient profile:   60 year old female Height:      66 inches (167.64 cm) Weight:      184 pounds (83.64 kg) BMI:     29.81 O2 Sat:      97 % on Room air Temp:     97.8 degrees F (36.56 degrees C) oral Pulse rate:   94 / minute BP sitting:   118 / 80  (left arm) Cuff size:   regular  Vitals Entered By: Michel Bickers CMA (August 12, 2010 12:14 PM)  O2 Sat at Rest %:  97 O2 Flow:  Room air CC: COPD follow-up...no complaints today Comments Medications reviewed with patient phone number verified. Michel Bickers CMA  August 12, 2010 12:14 PM   Physical Exam  General:  normal appearance.   Nose:  no nasal discharge Mouth:  MP 2, scalloped borders, decreased AP diameter of oropharynx, low laying soft palate Neck:  no LAN Lungs:  no wheezing or rales Heart:  regular rhythm, normal rate, and no murmurs.   Extremities:  no edema Neurologic:  normal CN II-XII and strength normal.   Cervical Nodes:  no significant adenopathy Psych:  alert and cooperative; normal mood and affect; normal attention span and concentration   Impression & Recommendations:  Problem # 1:  CHRONIC OBSTRUCTIVE ASTHMA UNSPECIFIED (ICD-493.20) She has improvement since addition of theophylline.  Will continue her current regimen.  Advised her to try changing when she uses spiriva to see if this improves symptoms control in the late afternoon.  She has been reluctant to consider trying xolair again.  Explained that we had some room to work with about increasing her inhaled steroid dose or theophylline dose.  She feels she is doing well, and would prefer to  continue her current regimen.  Problem # 2:  ALLERGIC RHINITIS (ICD-477.9)  Stable.  Medications Added to Medication List This Visit: 1)  Synthroid 88 Mcg Tabs (Levothyroxine sodium) .Marland Kitchen.. 1 by mouth daily  Complete Medication List: 1)  Spiriva Handihaler 18 Mcg Caps (Tiotropium bromide monohydrate) .... Inhale contents of 1 capsule once a day 2)  Symbicort 160-4.5 Mcg/act Aero (Budesonide-formoterol fumarate) .... Inhale 2 puffs two times a day 3)  Theophylline Cr 100 Mg Xr12h-tab (Theophylline) .... One by mouth two times a day 4)  Singulair 10 Mg Tabs (Montelukast sodium) .... Take 1 tablet by mouth once a day 5)  Proair Hfa 108 (90 Base) Mcg/act Aers (Albuterol sulfate) .... Two puffs upto four times per day as needed 6)  Omeprazole 20 Mg Tbec (Omeprazole) .Marland Kitchen.. 1 tablet by mouth before first and last meals of the day 7)  Multivitamins Tabs (Multiple vitamin) .Marland Kitchen.. 1 by mouth daily 8)  Synthroid 88 Mcg Tabs (Levothyroxine sodium) .Marland Kitchen.. 1 by mouth daily  Other Orders: Est. Patient Level III ([address])  Patient Instructions: 1)  Follow up in 6 months Prescriptions: PROAIR HFA 108 (90 BASE) MCG/ACT AERS (ALBUTEROL SULFATE) two puffs upto four times per day as needed  #1 x 6   Entered and Authorized by:   Coralyn Helling MD   Signed by:   Coralyn Helling MD on 08/12/2010   Method used:   Electronically to        [facility] #[id number]* (retail)       [address]       [place], [place]  [address]       Ph: [phone]       Fax: [phone]   RxID:   [id number] SINGULAIR 10 MG  TABS (MONTELUKAST SODIUM) Take 1 tablet by mouth once a day  #30 x 6   Entered and Authorized by:   Coralyn Helling MD   Signed by:   Coralyn Helling MD on 08/12/2010   Method used:   Electronically to        [facility] #[id number]* (retail)       [address]       [place], [place]  [address]       Ph: [phone]        Fax: [phone]   RxID:   [id number] THEOPHYLLINE CR 100 MG XR12H-TAB (THEOPHYLLINE) one by mouth two times a day  #60 x 6   Entered and Authorized by:   Coralyn Helling MD   Signed by:   Coralyn Helling MD on 08/12/2010   Method used:   Electronically to        [facility] #[id number]* (retail)       [address]  [place], [place]  [address]       Ph: [phone]       Fax: [phone]   RxID:   [id number] SYMBICORT 160-4.5 MCG/ACT  AERO (BUDESONIDE-FORMOTEROL FUMARATE) Inhale 2 puffs two times a day  #1 x 6   Entered and Authorized by:   Coralyn Helling MD   Signed by:   Coralyn Helling MD on 08/12/2010   Method used:   Electronically to        [facility] #[id number]* (retail)       [address]       [place], [place]  [address]       Ph: [phone]       Fax: [phone]   RxID:   [id number] SPIRIVA HANDIHALER 18 MCG  CAPS (TIOTROPIUM BROMIDE MONOHYDRATE) Inhale contents of 1 capsule once a day  #30 x 6   Entered and Authorized by:   Coralyn Helling MD   Signed by:   Coralyn Helling MD on 08/12/2010   Method used:   Electronically to        [facility] #[id number]* (retail)       [address]       [place], [place]  [address]       Ph: [phone]       Fax: [phone]   RxID:   [id number]

## 2010-09-02 NOTE — Progress Notes (Signed)
Summary: omeprazole refill  Phone Note Call from Patient Call back at Home Phone [phone]   Caller: Patient Call For: sood Reason for Call: Talk to Nurse Summary of Call: Patient needing rx for omerazole, brand name too expensive.  [facility] [address] Initial call taken by: Lehman Prom,  July 27, 2010 3:41 PM  Follow-up for Phone Call        called spoke with patient who verified that the prilosec is too expensive and is requesting a refill on the omeprazole.  pt has upcoming appt w/ VS on 1.12.12 and is aware she needs to keep that appt.  refills sent to pt's verified pharmacy. Boone Master CNA/MA  July 27, 2010 4:17 PM     New/Updated Medications: OMEPRAZOLE 20 MG TBEC (OMEPRAZOLE) 1 tablet by mouth before first and last meals of the day Prescriptions: OMEPRAZOLE 20 MG TBEC (OMEPRAZOLE) 1 tablet by mouth before first and last meals of the day  #60 x 3   Entered by:   Boone Master CNA/MA   Authorized by:   Coralyn Helling MD   Signed by:   Boone Master CNA/MA on 07/27/2010   Method used:   Electronically to        [facility] #[id number]* (retail)       [address]       [place], [place]  [address]       Ph: [phone]       Fax: [phone]   RxID:   [id number]

## 2010-09-08 ENCOUNTER — Other Ambulatory Visit (HOSPITAL_COMMUNITY): Payer: Self-pay | Admitting: Obstetrics and Gynecology

## 2010-09-08 DIAGNOSIS — Z1231 Encounter for screening mammogram for malignant neoplasm of breast: Secondary | ICD-10-CM

## 2010-10-18 ENCOUNTER — Ambulatory Visit (HOSPITAL_COMMUNITY)
Admission: RE | Admit: 2010-10-18 | Discharge: 2010-10-18 | Disposition: A | Discharge status: Home / Self Care | Payer: BC Managed Care – PPO | Source: Ambulatory Visit | Attending: Obstetrics and Gynecology | Admitting: Obstetrics and Gynecology

## 2010-10-18 DIAGNOSIS — Z1231 Encounter for screening mammogram for malignant neoplasm of breast: Secondary | ICD-10-CM

## 2010-10-27 ENCOUNTER — Other Ambulatory Visit: Payer: Self-pay | Admitting: Pulmonary Disease

## 2010-10-28 ENCOUNTER — Telehealth: Payer: Self-pay | Admitting: Pulmonary Disease

## 2010-10-28 NOTE — Telephone Encounter (Signed)
Per refill done by Dr. Craige Cotta, Danice Goltz 24 100mg  was for once a day dosing.

## 2010-11-29 ENCOUNTER — Other Ambulatory Visit: Payer: Self-pay | Admitting: Pulmonary Disease

## 2010-12-14 NOTE — Assessment & Plan Note (Signed)
[facility]                             PULMONARY OFFICE NOTE   RANAE, CASEBIER                    MRN:          [id number]  DATE:02/12/2007                            DOB:          22-Apr-1951    HISTORY OF PRESENT ILLNESS:  The patient is a 60 year old  African/American female, previously a patient of Dr. Assunta Gambles Gonzalez's  who was recently seen in the office three days ago for an acute  asthmatic bronchitic flare and given a prednisone taper.  The patient  has had a five-day history of increased cough, congestion, low-grade  fever, chills, body aches and minimally productive cough.  The patient  was given a prednisone taper; however, reports that the symptoms have  not improved and the cough is starting to loosen up.  The patient was  also instructed to hold her fish oil and Fosamax due to a potential for  upper airway irritation until the cough has improved.  The patient has  also used Mucinex DM b.i.d.  The patient denies any hemoptysis,  orthopnea, PND or leg swelling.   PAST MEDICAL HISTORY:  Is reviewed.   CURRENT MEDICATIONS:  Are reviewed.   PHYSICAL EXAMINATION:  GENERAL:  The patient is a pleasant female, in no  acute distress.  VITAL SIGNS:  She is afebrile with stable vital signs.  O2 saturation  98% on room air.  HEENT:  Unremarkable.  NECK:  Supple without adenopathy.  No jugular venous distention.  LUNGS:  Sounds are clear to auscultation without any wheezing or  crackles.  HEART:  A regular rate.  ABDOMEN:  Soft, nontender.  EXTREMITIES:  Warm without any calf tenderness, clubbing, cyanosis or  edema.   DATA:  A chest x-ray revealed a small patchy density along the left  lower lobe.   IMPRESSION/PLAN:  Suspected left lower lobe pneumonia with patchy  density on x-ray.  The patient will begin Avelox 400 mg daily x7 days.  Continue on Mucinex DM b.i.d.  A prescription was called in to  [facility] on [address].   The patient was notified of the chest x-ray  results.   FOLLOWUP:  The patient will return back here in two weeks with Dr.  Coralyn Helling for a follow-up chest x-ray, or sooner if needed.  The  patient is to contact our office is the symptoms are not improved or  worsen, for a sooner office visit.      Rubye Oaks, NP  Electronically Signed      Coralyn Helling, MD  Electronically Signed   TP/MedQ  DD: 02/12/2007  DT: 02/13/2007  Job #: [id number]

## 2010-12-14 NOTE — Assessment & Plan Note (Signed)
[facility]                             PULMONARY OFFICE NOTE   Mary [name], Mary [name]                    MRN:          [id number]  DATE:02/09/2007                            DOB:          08-May-1951    HISTORY OF PRESENT ILLNESS:  The patient is a 60 year old  African/American female, a previous patient of Dr. Gailen Shelter who  has a known history of chronic obstructive pulmonary disease with an  asthmatic bronchitic component.  She presents today for an acute office  visit.  The patient complains that over the last two days that she has  had a dry cough, intermittent wheezing and shortness of breath.  The  patient did have some slight fever last evening, but that has resolved  today.   PAST MEDICAL HISTORY:  Is reviewed.   CURRENT MEDICATION:  Are reviewed.   PHYSICAL EXAMINATION:  The patient is a pleasant female, in no acute  distress.  VITAL SIGNS:  She is afebrile with stable vital signs.  O2 saturation is  99% on room air.  HEENT:  Unremarkable.  NECK:  Supple without cervical adenopathy.  No jugular venous  distention.  LUNGS:  Sounds reveal some coarse breath sounds and a few faint  expiratory wheezes.  HEART:  A regular rate without murmur, rub or gallop.  ABDOMEN:  Soft, nontender.  No palpable hepatosplenomegaly.  EXTREMITIES:  Warm without any clubbing, cyanosis or edema.   IMPRESSION/PLAN:  Asthmatic bronchitic exacerbation:  The patient was  given a Xopenex nebulizer treatment in the office today.  She will begin  prednisone taper over the next week.  Add in Mucinex DM as needed for  cough.  The patient is to hold her fish oil and Fosamax until the  symptoms resolve, due to the potential to irritate the upper airways.      Mary Oaks, NP  Electronically Signed      Coralyn Helling, MD  Electronically Signed   TP/MedQ  DD: 02/09/2007  DT: 02/11/2007  Job #: [id number]

## 2010-12-14 NOTE — Assessment & Plan Note (Signed)
[facility]                         GASTROENTEROLOGY OFFICE NOTE   BRITTNEI, JAGIELLO                    MRN:          [id number]  DATE:10/24/2007                            DOB:          04-Jun-1951    REFERRING PHYSICIAN:  Allena Napoleon   PROBLEM:  Dysphagia.   Ms. Woodring is a 60 year old African-American female, referred through  the courtesy of Dr. Alessandra Bevels for evaluation.  She has a history of an  esophageal stricture and was last dilated in 2004.  She is complaining  of intermittent dysphagia to solids.  She denies pyrosis, per se.  There  is no history of odynophagia.  She also has a history of colon polyps  that were removed five years ago.  She is scheduled for followup with  Dr. Kinnie Scales.   PAST MEDICAL HISTORY:  Pertinent for asthma and thyroid disease.  She is  status post tubal ligation.   FAMILY HISTORY:  Noncontributory.   MEDICATIONS:  Include Singular, Spiriva, Symbicort, Fosamax, calcium,  Synthroid, meclizime.   ALLERGIES:  She has no allergies.   She does not smoke.  She rarely drinks.  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   REVIEW OF SYSTEMS:  Positive for frequent cough, night sweats, back pain  and shortness of breath.   PHYSICAL EXAMINATION:  Pulse 68, blood pressure 118/78, weight 164.  HEENT: EOMI.  PERRLA.  Sclerae are anicteric.  Conjunctivae are pink.  NECK:  Supple without thyromegaly, adenopathy or carotid bruits.  CHEST:  Clear to auscultation and percussion without adventitious  sounds.  CARDIAC:  Regular rhythm; normal S1 S2.  There are no murmurs, gallops  or rubs.  ABDOMEN:  Bowel sounds are normoactive.  Abdomen is soft, nontender and  nondistended.  There are no abdominal masses, tenderness, splenic  enlargement or hepatomegaly.  EXTREMITIES:  Full range of motion.  No cyanosis, clubbing or edema.  RECTAL:  Deferred.   IMPRESSION:  1. Recurrent esophageal stricture.  2. GERD.  3.  History of colon polyps.   RECOMMENDATION:  1. Begin omeprazole 20 mg a day.  2. Upper endoscopy with dilatation as indicated.  3. Followup colonoscopy.  Schedule with Dr. Kinnie Scales.     Barbette Hair. Arlyce Dice, MD,FACG  Electronically Signed    RDK/MedQ  DD: 10/24/2007  DT: 10/24/2007  Job #: [id number]   cc:   Allena Napoleon

## 2010-12-14 NOTE — Assessment & Plan Note (Signed)
[facility]                             PULMONARY OFFICE NOTE   Mary [name], Mary [name]                    MRN:          [id number]  DATE:05/04/2007                            DOB:          15-Apr-1951    I saw Mary [name] in followup today for her COPD with asthma, seasonal  allergic and perineal rhinitis.   Since her last visit, she has been noticing her symptoms have been  getting worse.  She has been having increased cough, productive of clear  to tan-colored sputum.  She denies any hemoptysis.  She has also been  having some throat irritation as well as a globus sensation, and she  frequently has to clear her throat.  She does have problems with itchy  watery eyes as well as some postnasal drip.  She says her reflux  symptoms are reasonably stable.  She has also had to start to use her  Xopenex inhaler 2-3 times a day, where as before she has not had to use  it at all.  She says, however, that her energy level has improved since  she recovered from her pneumonia.  She has not been having any recent  problems with fever, chills, sweats, or weight loss.  She has not had  any abdominal pain, nausea, vomiting, or diarrhea.  She also denies any  skin rashes or leg swelling.   CURRENT MEDICATIONS:  1. Singulair 10 mg daily.  2. Multivitamin daily.  3. Magnesium daily.  4. Armour thyroid 5 mg daily.  5. Spiriva 1 puff daily.  6. Symbicort 160/4.5, 2 puffs b.i.d.  7. Zegerid 40 mg daily.  8. Vitamin D daily.  9. Fosamax 70 mg once a week.  10.Xopenex 2 puffs 2-3 times daily.   PHYSICAL EXAM:  She is 173 pounds, temperature is 98, blood pressure  104/64, heart rate 62, oxygen saturation 97% on room air.  HEENT:  She had a clear nasal discharge with a boggy nasal mucosa.  There is no sinus tenderness.  She has mild erythema of the posterior  pharynx.  There are no other oral lesions.  No lymphadenopathy.  No  thyromegaly.  HEART:  S1, S2.  CHEST:  She had a prolonged expiratory phase, but there was no wheezing  or rales.  ABDOMEN:  Soft, nontender, positive bowel sounds.  EXTREMITIES:  No edema.   IMPRESSION:  1. Chronic obstructive pulmonary disease with asthma.  I would have      her continue on her current inhaler regimen of Spiriva, Symbicort,      and Xopenex.  I would also have her continue on her Singulair.  I      suspect she has a significant allergic component with allergic      rhinitis and postnasal drip which is likely contributing to her      worsening of her symptom status.  I will give her a sample and      prescription for Nasonex which she is to use 2 sprays in each      nostril once daily.  I have also instructed her on  the use of nasal      irrigation which she is to use once or twice a day.  Then,      dependent upon her response to this, if she is fairly symptomatic,      then we may need to have her undergo further testing which could      include pulmonary function testing, imaging studies of her chest as      well as her sinuses as well as laboratory assessment to see if she      would be a possible candidate for Xolair.  2. Exercise induced bronchoconstriction.  I have discussed with her      techniques to try and improve her pre workup warm-up as well as      breathing techniques and also to continue using her Xopenex prior      to her exercise regimen.  3. Reflux disease.  She is to continue on Zegerid.  4. I have administered her a pneumococcal vaccination in my office      today.  She said that she had her influenza vaccination done      earlier this week.   I will follow up with her in about 6 weeks.     Coralyn Helling, MD  Electronically Signed    VS/MedQ  DD: 05/04/2007  DT: 05/04/2007  Job #: [id number]

## 2010-12-14 NOTE — Assessment & Plan Note (Signed)
[facility]                             PULMONARY OFFICE NOTE   FATHIMA, BARTL                    MRN:          [id number]  DATE:02/26/2007                            DOB:          09/01/1950    I saw Ms. Gloeckner today for followup of her COPD and pneumonia.   She has previously been seen by my partner, Dr. Gailen Shelter, who  has since left our practice.  I will assume her respiratory care for  now.  She was seen by my nurse practitioner earlier this month, with  symptoms of worsening dyspnea, and was found to have a left lower lobe  infiltrate on her chest x-ray.  She was given a course of prednisone and  Avelox for one week, with improvement in her symptoms.  She says that  she is not having any more chills, but still has some problems with  dyspnea on exertion, but this seems to be slightly better.  She is still  having a cough, with the production of clear sputum, but denies any  hemoptysis.  She is not having any chest pains or palpitations.  She is  also not having any wheezing.   She is currently using Spiriva 1 puff daily, as well as Symbicort  160/4.5, 2 puffs b.i.d.  She is also currently using her Xopenex inhaler  twice a day, whereas before this episode of pneumonia she did not feel  the need to use it hardly at all.  The remainder of her medication list  was reviewed.   PHYSICAL EXAMINATION:  VITAL SIGNS:  She is 173 pounds, temperature is  98.6, blood pressure is 110/78, heart rate is 73, oxygen saturation is  96% on room air.  HEENT:  There was no sinus tenderness, no nasal discharge, no oral  lesions.  NECK:  No lymphadenopathy.  HEART:  S1, S2.  CHEST:  There was no wheezing or rales.  ABDOMEN:  Soft, nontender.  EXTREMITIES:  There was no edema.   Chest x-ray in my office today showed nearly complete resolution of the  left lower lobe infiltrate.   IMPRESSION:  1. Left lower lobe pneumonia, which appears to  have improved      radiographically.  I have discussed with Ms. Kalisz that it is      not unusual to have persistent symptoms of fatigue and dyspnea this      soon after being diagnosed with pneumonia, and that I would just      monitor her clinically for now.  If her symptoms were to persist      like this over the next several weeks to months, then further      diagnostic interventions may be necessary.  2. Chronic obstructive pulmonary disease.  She has continued on her      use of Spiriva and Symbicort, as well as Xopenex as needed.   I will follow up with her in approximately 4 months.     Coralyn Helling, MD  Electronically Signed    VS/MedQ  DD: 02/26/2007  DT: 02/27/2007  Job #: [id number]  cc:   Allena Napoleon

## 2010-12-17 NOTE — Assessment & Plan Note (Signed)
[name]                             PULMONARY OFFICE NOTE   Mary [name], Mary [name]                    MRN:          [id number]  DATE:11/06/2006                            DOB:          May 17, 1951    This is a very pleasant 60 year old African-American female who presents  for followup on chronic obstructive pulmonary disease with asthmatic  bronchitic component.  She has an FEV1 in the 1.6 liter range, or 48% of  predicted with an FEV1% of 54% of predicted.  This is as of October 04, 2006.   The patient has been placed on Symbicort and Spiriva.  She states that  she actually is doing very well.  She is exercising on her own and  declined pulmonary rehab.  She actually feels overall much better than  during her initial visit.   CURRENT MEDICATIONS:  As noted on the intake sheet.  These have been  reviewed and are accurate.   PHYSICAL EXAMINATION:  VITAL SIGNS:  As noted.  Oxygen saturation was  98% on room air.  GENERAL:  This is a well-developed and well-nourished African-American  female who is in no acute distress.  HEENT:  Unremarkable.  NECK:  Supple.  No adenopathy noted.  No JVD.  LUNGS:  Distant but clear to auscultation bilaterally.  No wheezes  noted.  CARDIAC:  Regular rate and rhythm.  No murmurs, rubs or gallops heard.  ABDOMEN:  Benign.  EXTREMITIES:  Patient has no clubbing, cyanosis or edema.   IMPRESSION:  1. Chronic obstructive pulmonary disease with asthmatic bronchitic      component.  2. Dyspnea secondary to the same, markedly improved.   PLAN:  1. Patient is to continue Symbicort 160/4.5 two inhalations twice      daily along with Spiriva 1 capsule inhaled daily.  2. The patient has been encouraged to continue her exercise routine.  3. The patient has already discontinued all tobacco products and has      remained abstinent.  4. Followup will be in 3-4 months time.  She has been assigned to      follow up with Dr.  Marcelyn Bruins, as I will be leaving the practice.      The patient has been made aware of this.     Mary Shelter, MD  Electronically Signed    CLG/MedQ  DD: 11/06/2006  DT: 11/07/2006  Job #: [id number]

## 2010-12-17 NOTE — Assessment & Plan Note (Signed)
[facility]                             PULMONARY OFFICE NOTE   ANETH, SCHLAGEL                    MRN:          [id number]  DATE:06/16/2006                            DOB:          Mar 19, 1951    Ms. Swoboda is a very pleasant 60 year old female who follows here for  asthmatic bronchitis and chronic obstructive pulmonary disease, the main  component of which is her asthmatic bronchitis.  The patient is  currently maintained on Symbicort and Spiriva, and feels that she is  doing very well on this.  She denies any acute complaints.   The patient is up to date on flu vaccine.   CURRENT MEDICATIONS:  As noted on the intake sheet.  These have been  reviewed and are accurate.   PHYSICAL EXAM:  VITAL SIGNS:  As noted.  Oxygen saturation is 98% on  room air.  GENERAL:  This is a well-developed, mildly obese female who is in no  acute distress.  HEENT:  Unremarkable.  NECK:  Supple.  No adenopathy noted.  No JVD.  LUNGS:  The patient is moving air very well today.  She has no wheezes  noted.  CARDIAC:  Regular rate and rhythm.  No rubs, murmurs, or gallops heard.  EXTREMITIES:  The patient has no cyanosis, clubbing, or edema noted.   IMPRESSION:  Chronic obstructive pulmonary disease with asthmatic  bronchitic component.  The patient currently well compensated on the  current regimen of Spiriva and Symbicort.   PLAN:  1. The patient to continue medications as they are.  2. Followup will be in 4 to 6 months' time.  She is to contact us      prior to that time should any problems arise.     Gailen Shelter, MD  Electronically Signed    CLG/MedQ  DD: 07/17/2006  DT: 07/17/2006  Job #: [id number]   cc:   Eileen Stanford, MD  Allena Napoleon

## 2010-12-17 NOTE — Assessment & Plan Note (Signed)
[facility]                             PULMONARY OFFICE NOTE   CORRENA, MEACHAM                    MRN:          [id number]  DATE:10/04/2006                            DOB:          04-15-1951    This is a very pleasant 60 year old [place] American female who follows  here for chronic obstructive pulmonary disease with asthmatic bronchitic  component. The patient presents today stating that she is actually  feeling fairly well. She is actually doing relatively well with her  inhalers mainly the Symbicort and Spiriva. We had offered her pulmonary  rehabilitation. However, she declined this. She presents today with no  specific complaint actually however does describe perhaps some increased  dyspnea on exertion. She states that however when she exercises  initially feels somewhat worse, but after exercise feels markedly  better. She is doing exercise on her own at home. The patient did, after  doing a mini review of systems, did point out that she had had some  increased gastroesophageal reflux symptoms despite Prilosec and also  some increased cough, which was essentially non-productive.   CURRENT MEDICATIONS:  Are as noted on the Intake Sheet. These have been  reviewed and are accurate.   PHYSICAL EXAMINATION:  VITAL SIGNS: Are as noted. Oxygen saturation is  97% on room air.  GENERAL: This is a well-developed, mildly obese [place] American female  who is in no acute distress.  HEENT: Is unremarkable.  NECK: Supple. No adenopathy noted. No JVD.  LUNGS:  She has some rare rhonchi, but no wheezes noted.  CARDIAC: Regular rate and rhythm. No murmurs, rubs or gallops.  LOWER EXTREMITIES: The patient has no cyanosis, clubbing or edema noted.   LABORATORY DATA:  We did perform spirometry today which shows an FEV1 of  1.46 liters or 48% of predicted and an FEV1 of 54%. This FEV1 percent is  equivalent to FVC/FEV1 ratio. The patient has  consistent with moderate  severe obstruction. These are essentially unchanged from prior.   IMPRESSION:  1. Moderate to severe chronic obstructive pulmonary disease with      asthmatic bronchitic component, fairly compensated though this      could be optimized.  2. Cough, secondary to gastroesophageal reflux.   PLAN:  1. Will be to continue Symbicort 160/4.5 two inhalations twice a day.  2. Continue Spiriva one capsule inhaled daily.  3. We will give her a trial of Zegerid 40 mg at bedtime to see if her      gastroesophageal reflux disease symptoms are controlled. She will      discontinue Symbicort.  4. Followup will be in 2 to 4 weeks time with our nurse practitioner,      Tammy Parrett,  or myself      to see how she is responding. After that, we will assign her for      followup with Dr. Craige Cotta. She is to contact us prior to that time      should any new problems arise.     Gailen Shelter, MD  Electronically Signed    CLG/MedQ  DD: 10/04/2006  DT: 10/04/2006  Job #: [id number]

## 2011-01-06 ENCOUNTER — Other Ambulatory Visit: Payer: Self-pay | Admitting: Pulmonary Disease

## 2011-02-02 ENCOUNTER — Other Ambulatory Visit: Payer: Self-pay | Admitting: Pulmonary Disease

## 2011-02-25 ENCOUNTER — Encounter: Payer: Self-pay | Admitting: Pulmonary Disease

## 2011-03-02 ENCOUNTER — Ambulatory Visit: Payer: BC Managed Care – PPO | Admitting: Pulmonary Disease

## 2011-03-14 ENCOUNTER — Other Ambulatory Visit: Payer: Self-pay | Admitting: Pulmonary Disease

## 2011-03-16 ENCOUNTER — Ambulatory Visit (INDEPENDENT_AMBULATORY_CARE_PROVIDER_SITE_OTHER): Payer: BC Managed Care – PPO | Admitting: Pulmonary Disease

## 2011-03-16 ENCOUNTER — Encounter: Payer: Self-pay | Admitting: Pulmonary Disease

## 2011-03-16 VITALS — BP 120/68 | HR 64 | Temp 97.8°F | Ht 66.0 in | Wt 181.0 lb

## 2011-03-16 DIAGNOSIS — J449 Chronic obstructive pulmonary disease, unspecified: Secondary | ICD-10-CM

## 2011-03-16 MED ORDER — BUDESONIDE-FORMOTEROL FUMARATE 160-4.5 MCG/ACT IN AERO: 2.0000 | INHALATION_SPRAY | Freq: Two times a day (BID) | RESPIRATORY_TRACT | Status: DC

## 2011-03-16 MED ORDER — THEOPHYLLINE ER 100 MG PO CP24: 100.0000 mg | ORAL_CAPSULE | Freq: Every day | ORAL | Status: DC

## 2011-03-16 MED ORDER — MONTELUKAST SODIUM 10 MG PO TABS: 10.0000 mg | ORAL_TABLET | Freq: Every day | ORAL | Status: DC

## 2011-03-16 MED ORDER — TIOTROPIUM BROMIDE MONOHYDRATE 18 MCG IN CAPS: 18.0000 ug | ORAL_CAPSULE | Freq: Every day | RESPIRATORY_TRACT | Status: DC

## 2011-03-16 MED ORDER — ALBUTEROL SULFATE HFA 108 (90 BASE) MCG/ACT IN AERS: 2.0000 | INHALATION_SPRAY | Freq: Four times a day (QID) | RESPIRATORY_TRACT | Status: DC | PRN

## 2011-03-16 NOTE — Progress Notes (Signed)
Subjective:    Patient ID: Mary [name], female    DOB: 12-14-1950, 60 y.o.   MRN: [id number]  HPI 60 yo female with chronic obstructive asthma, allergic rhinitis, and exercise induced bronchoconstriction. Her IgE level was 327.6 from 02/28/08 with multiple allergens.  She gets sudden attacks of chest tightness and dyspnea.  She uses her rescue inhaler and this improves after about 5 minutes.  She does not feel like this limits her activity in anyway.  These attacks happen about 2 or 3 times per week.  She is not aware of anything that might trigger these attacks.  She denies palpitations, chest pain, sputum, fever, gland swelling, sinus congestion, or jitters.  Past Medical History  Diagnosis Date  . Chronic obstructive asthma   . Allergic rhinitis   . GERD (gastroesophageal reflux disease)   . Exercise-induced bronchoconstriction   . Osteoporosis   . Esophageal stricture   . Colon polyp   . Pneumonia   . Hypothyroidism    Review of Systems     Objective:   Physical Exam BP 120/68  Pulse 64  Temp(Src) 97.8 F (36.6 C) (Oral)  Ht 5\' 6"  (1.676 m)  Wt 181 lb (82.101 kg)  BMI 29.21 kg/m2  SpO2 98%  General - healthy HEENT - no sinus tenderness, no oral exudate, no LAN Cardiac - s1s2 regular, no murmur Chest - no wheeze/rales Abd - soft, nontender Ext - no e/c/c Neuro - normal strength, no tremor, CN intact Psych - normal mood/behavior Skin - no rashes     Assessment & Plan:   CHRONIC OBSTRUCTIVE ASTHMA UNSPECIFIED She has intermittent flares that respond quickly to rescue inhaler therapy.  Will continue her current regimen.  She would like to stretch the frequency of her visits out further.  Will have her follow up in one year.    Updated Medication List Outpatient Encounter Prescriptions as of 03/16/2011  Medication Sig Dispense Refill  . albuterol (PROAIR HFA) 108 (90 BASE) MCG/ACT inhaler Inhale 2 puffs into the lungs every 6 (six) hours as needed.  1  Inhaler  11  . budesonide-formoterol (SYMBICORT) 160-4.5 MCG/ACT inhaler Inhale 2 puffs into the lungs 2 (two) times daily.  1 Inhaler  11  . levothyroxine (SYNTHROID, LEVOTHROID) 88 MCG tablet Take 88 mcg by mouth daily.        . montelukast (SINGULAIR) 10 MG tablet Take 1 tablet (10 mg total) by mouth at bedtime.  30 tablet  11  . Multiple Vitamin (MULTIVITAMIN) capsule Take 1 capsule by mouth daily.        . Omeprazole 20 MG TBEC TAKE 1 TABLET BY MOUTH 30 MINUTES BEFORE FIRST AND LAST MEALS OF THE DAY  56 tablet  2  . theophylline (THEO-24) 100 MG 24 hr capsule Take 1 capsule (100 mg total) by mouth daily.  30 capsule  11  . tiotropium (SPIRIVA) 18 MCG inhalation capsule Place 1 capsule (18 mcg total) into inhaler and inhale daily.  30 capsule  11  . DISCONTD: albuterol (PROAIR HFA) 108 (90 BASE) MCG/ACT inhaler Inhale 2 puffs into the lungs every 6 (six) hours as needed.        Marland Kitchen DISCONTD: budesonide-formoterol (SYMBICORT) 160-4.5 MCG/ACT inhaler Inhale 2 puffs into the lungs 2 (two) times daily.        Marland Kitchen DISCONTD: montelukast (SINGULAIR) 10 MG tablet Take 10 mg by mouth daily.        Marland Kitchen DISCONTD: THEO-24 100 MG 24 hr capsule TAKE ONE CAPSULE  BY MOUTH ONCE DAILY  30 capsule  0  . DISCONTD: tiotropium (SPIRIVA) 18 MCG inhalation capsule Place 18 mcg into inhaler and inhale daily.

## 2011-03-16 NOTE — Assessment & Plan Note (Signed)
She has intermittent flares that respond quickly to rescue inhaler therapy.  Will continue her current regimen.  She would like to stretch the frequency of her visits out further.  Will have her follow up in one year.

## 2011-03-16 NOTE — Patient Instructions (Signed)
Follow-up in one year.

## 2011-04-25 ENCOUNTER — Ambulatory Visit (INDEPENDENT_AMBULATORY_CARE_PROVIDER_SITE_OTHER): Payer: BC Managed Care – PPO

## 2011-04-25 ENCOUNTER — Inpatient Hospital Stay (INDEPENDENT_AMBULATORY_CARE_PROVIDER_SITE_OTHER)
Admission: RE | Admit: 2011-04-25 | Discharge: 2011-04-25 | Disposition: A | Discharge status: Home / Self Care | Payer: BC Managed Care – PPO | Source: Ambulatory Visit | Attending: Family Medicine | Admitting: Family Medicine

## 2011-04-25 DIAGNOSIS — M545 Low back pain, unspecified: Secondary | ICD-10-CM

## 2011-04-25 DIAGNOSIS — M5430 Sciatica, unspecified side: Secondary | ICD-10-CM

## 2011-06-16 ENCOUNTER — Other Ambulatory Visit: Payer: Self-pay | Admitting: Pulmonary Disease

## 2011-07-06 ENCOUNTER — Telehealth: Payer: Self-pay | Admitting: Pulmonary Disease

## 2011-07-06 NOTE — Telephone Encounter (Signed)
Called spoke with patient who c/o increased SOB, dry cough, head congestion with runny nose, clear drainage, chills/sweats/body aches, pressure in chest and fatigue x3days.  Last seen by VS 8.15.12 with follow up in 1 year.  VS out of the office, will forward to doc of the day.  [facility] at [place] [address]  [place].  Dr Shelle Iron please advise, thanks.

## 2011-07-06 NOTE — Telephone Encounter (Signed)
Noted  

## 2011-07-06 NOTE — Telephone Encounter (Signed)
LMOM TCB at both numbers in message x1.

## 2011-07-06 NOTE — Telephone Encounter (Signed)
Patient can now be reached @ [phone]

## 2011-07-06 NOTE — Telephone Encounter (Signed)
Called spoke with patient who declined [facility]'s offer of prednisone stating that she "avoids it like the plague" because of osteoporosis d/t high dosages in the past.  VS with no openings this week.  appt scheduled with MR 12.6.12 @ 0945.  Pt okay with this date and time.  Advised if symptoms worsen during the night to call 911 or go to the ER.  Pt verbalized her understanding.  Will sign off and route to VS.

## 2011-07-06 NOTE — Telephone Encounter (Signed)
Pt returning call can be reached at [phone].Mary [name]

## 2011-07-06 NOTE — Telephone Encounter (Signed)
Sounds like she is having URI or flu with early flare of her copd. Would treat with prednisone for asthma flare, and treat her other symptoms symptomatically  Prednisone 40mg  a day for 2 days, 30mg  for 2 days, 20mg  for 2 days, then stop Can try tylenol cold and sinus short term (3-4 days) Call us if she begins to cough up purulent mucus. Go to ER if worsens during the middle of the night

## 2011-07-07 ENCOUNTER — Ambulatory Visit: Payer: BC Managed Care – PPO | Admitting: Internal Medicine

## 2011-08-05 ENCOUNTER — Telehealth: Payer: Self-pay | Admitting: Pulmonary Disease

## 2011-08-05 MED ORDER — PREDNISONE 10 MG PO TABS: ORAL_TABLET | ORAL | Status: DC

## 2011-08-05 NOTE — Telephone Encounter (Signed)
Spoke with pt. She states has had increased SOB x 5 days, started with scratchy throat and is now starting to have prod cough with minimal yellow sputum. Denies CP, wheeze, f/c/s.  She states that she has been taking mucinex dm and tylenol cold and flu for a few days w/o relief. She feels that steroid taper is needed now. Will forward to doc of the day to address. RB, please advise, thanks! No Known Allergies

## 2011-08-05 NOTE — Telephone Encounter (Signed)
Per RB, can have Prednisone 10mg --4 x 3 days, 3 x 3 days, 2 x 3 days, 1 x 3 days then stop. Pt should call if sxs do not improve or seek emergency help if sxs get worse.  LMOMTCB x 1. On msg, told pt that the prescription has been sent to [facility] on [facility] and [address] and to pls call to let us know she received this msg.

## 2011-08-05 NOTE — Telephone Encounter (Signed)
lmomtcb  

## 2011-08-08 NOTE — Telephone Encounter (Signed)
lmomtcb to confirm she did receive rx

## 2011-08-09 NOTE — Telephone Encounter (Signed)
Noted  

## 2011-08-09 NOTE — Telephone Encounter (Signed)
LMTCB x2  

## 2011-08-09 NOTE — Telephone Encounter (Signed)
[phone] Pt called and did receive rx and is using it.  Call if needed Leanora Ivanoff

## 2011-08-23 ENCOUNTER — Other Ambulatory Visit (HOSPITAL_COMMUNITY): Payer: Self-pay | Admitting: Obstetrics and Gynecology

## 2011-08-23 DIAGNOSIS — Z1231 Encounter for screening mammogram for malignant neoplasm of breast: Secondary | ICD-10-CM

## 2011-09-12 ENCOUNTER — Encounter: Payer: Self-pay | Admitting: Pulmonary Disease

## 2011-09-12 ENCOUNTER — Ambulatory Visit (INDEPENDENT_AMBULATORY_CARE_PROVIDER_SITE_OTHER): Payer: BC Managed Care – PPO | Admitting: Pulmonary Disease

## 2011-09-12 VITALS — BP 128/80 | HR 76 | Temp 97.7°F | Ht 65.0 in | Wt 182.0 lb

## 2011-09-12 DIAGNOSIS — J449 Chronic obstructive pulmonary disease, unspecified: Secondary | ICD-10-CM

## 2011-09-12 MED ORDER — PREDNISONE (PAK) 10 MG PO TABS: ORAL_TABLET | ORAL | Status: AC

## 2011-09-12 MED ORDER — DOXYCYCLINE HYCLATE 100 MG PO CAPS: 100.0000 mg | ORAL_CAPSULE | Freq: Two times a day (BID) | ORAL | Status: AC

## 2011-09-12 NOTE — Progress Notes (Signed)
Chief Complaint  Patient presents with  . Follow-up    Pt states she has had a hard time w/ her SOB.c/o chest tightness, PND, runny nose denies any wheezing    History of Present Illness: Mary [name] is a 61 y.o. female with chronic obstructive asthma, allergic rhinitis, and exercise induced bronchoconstriction. Her IgE level was 327.6 from 02/28/08 with multiple allergens.  She needed to use prednisone twice in the past two months.  She feels like her breathing is getting back to the point she was at several years ago when she had to always be on prednisone.  She is using her proair 2 to 3 times per day.  She has notice more reflux.  She has tried stopping theophylline before, but then her asthma gets much worse.  Past Medical History  Diagnosis Date  . Chronic obstructive asthma   . Allergic rhinitis   . GERD (gastroesophageal reflux disease)   . Exercise-induced bronchoconstriction   . Osteoporosis   . Esophageal stricture   . Colon polyp   . Pneumonia   . Hypothyroidism     Past Surgical History  Procedure Date  . Esophageal dilation   . Tubal ligation     No Known Allergies  Physical Exam:  Blood pressure 128/80, pulse 76, temperature 97.7 F (36.5 C), temperature source Oral, height 5\' 5"  (1.651 m), weight 182 lb (82.555 kg), SpO2 98.00%. Body mass index is 30.29 kg/(m^2). Wt Readings from Last 2 Encounters:  09/12/11 182 lb (82.555 kg)  03/16/11 181 lb (82.101 kg)   General - healthy  HEENT - no sinus tenderness, no oral exudate, mild erythema posterior pharynx, no LAN  Cardiac - s1s2 regular, no murmur  Chest - no wheeze/rales  Abd - soft, nontender  Ext - no e/c/c  Neuro - normal strength, no tremor, CN intact  Psych - normal mood/behavior  Skin - no rashes  Spirometry 09/12/11>>FEV1 1.28(60%), FEV1% 55  Assessment/Plan:  Outpatient Encounter Prescriptions as of 09/12/2011  Medication Sig Dispense Refill  . albuterol (PROAIR HFA) 108 (90 BASE)  MCG/ACT inhaler Inhale 2 puffs into the lungs every 6 (six) hours as needed.  1 Inhaler  11  . budesonide-formoterol (SYMBICORT) 160-4.5 MCG/ACT inhaler Inhale 2 puffs into the lungs 2 (two) times daily.  1 Inhaler  11  . levothyroxine (SYNTHROID, LEVOTHROID) 88 MCG tablet Take 88 mcg by mouth daily.        . montelukast (SINGULAIR) 10 MG tablet Take 1 tablet (10 mg total) by mouth at bedtime.  30 tablet  11  . Multiple Vitamin (MULTIVITAMIN) capsule Take 1 capsule by mouth daily.        . Omeprazole 20 MG TBEC TAKE 1 TABLET BY MOUTH 30 MINUTES BEFORE FIRST AND LAST MEALS OF THE DAY  56 tablet  8  . theophylline (THEO-24) 100 MG 24 hr capsule Take 1 capsule (100 mg total) by mouth daily.  30 capsule  11  . tiotropium (SPIRIVA) 18 MCG inhalation capsule Place 1 capsule (18 mcg total) into inhaler and inhale daily.  30 capsule  11  . DISCONTD: predniSONE (DELTASONE) 10 MG tablet Take 4 by mouth x 3 days, 3 by mouth x 3 days, 2 by mouth x 3 days, 1 by mouth x 3 days then stop  30 tablet  0    Dreyden Rohrman Pager:  [phone] 09/12/2011, 4:02 PM

## 2011-09-12 NOTE — Patient Instructions (Signed)
You can look up Broncho-thermoplasty for the treatment of refractory asthma Follow up in one year>>call if help needed sooner

## 2011-09-12 NOTE — Assessment & Plan Note (Signed)
She is continuing to have trouble with her asthma control.  She was tried on [employer] in 2010, but was intolerant of this.  I discussed the option of evaluation for bronchothermoplasty, and she will look into this more.  She is to continue her current regimen.  She is fairly savvy with regards to monitoring her asthma symptoms.  I have given her a script for prednisone and doxycycline that she is to keep handy.  I have advised her to call prior to her taking these medications.

## 2011-10-11 ENCOUNTER — Other Ambulatory Visit: Payer: Self-pay | Admitting: Obstetrics and Gynecology

## 2011-10-19 ENCOUNTER — Ambulatory Visit (HOSPITAL_COMMUNITY)
Admission: RE | Admit: 2011-10-19 | Discharge: 2011-10-19 | Disposition: A | Discharge status: Home / Self Care | Payer: BC Managed Care – PPO | Source: Ambulatory Visit | Attending: Obstetrics and Gynecology | Admitting: Obstetrics and Gynecology

## 2011-10-19 DIAGNOSIS — Z1231 Encounter for screening mammogram for malignant neoplasm of breast: Secondary | ICD-10-CM

## 2012-04-05 ENCOUNTER — Telehealth: Payer: Self-pay | Admitting: Pulmonary Disease

## 2012-04-05 DIAGNOSIS — J449 Chronic obstructive pulmonary disease, unspecified: Secondary | ICD-10-CM

## 2012-04-05 MED ORDER — THEOPHYLLINE ER 100 MG PO CP24: 100.0000 mg | ORAL_CAPSULE | Freq: Every day | ORAL | Status: DC

## 2012-04-05 MED ORDER — BUDESONIDE-FORMOTEROL FUMARATE 160-4.5 MCG/ACT IN AERO: 2.0000 | INHALATION_SPRAY | Freq: Two times a day (BID) | RESPIRATORY_TRACT | Status: DC

## 2012-04-05 MED ORDER — MONTELUKAST SODIUM 10 MG PO TABS: 10.0000 mg | ORAL_TABLET | Freq: Every day | ORAL | Status: DC

## 2012-04-05 MED ORDER — TIOTROPIUM BROMIDE MONOHYDRATE 18 MCG IN CAPS: 18.0000 ug | ORAL_CAPSULE | Freq: Every day | RESPIRATORY_TRACT | Status: DC

## 2012-04-05 NOTE — Telephone Encounter (Signed)
RX's have been sent. 

## 2012-06-22 ENCOUNTER — Telehealth: Payer: Self-pay | Admitting: Pulmonary Disease

## 2012-06-22 MED ORDER — OMEPRAZOLE 20 MG PO CPDR: 20.0000 mg | DELAYED_RELEASE_CAPSULE | Freq: Two times a day (BID) | ORAL | Status: DC

## 2012-06-22 NOTE — Telephone Encounter (Signed)
Called and spoke with pt and she is aware that rx for the omeprazole has been faxed to her pharmacy.  Nothing further is needed.

## 2012-09-13 ENCOUNTER — Ambulatory Visit: Payer: BC Managed Care – PPO | Admitting: Pulmonary Disease

## 2012-09-28 ENCOUNTER — Other Ambulatory Visit (HOSPITAL_COMMUNITY): Payer: Self-pay | Admitting: Obstetrics and Gynecology

## 2012-09-28 DIAGNOSIS — Z1231 Encounter for screening mammogram for malignant neoplasm of breast: Secondary | ICD-10-CM

## 2012-10-01 ENCOUNTER — Telehealth: Payer: Self-pay | Admitting: Pulmonary Disease

## 2012-10-01 NOTE — Telephone Encounter (Signed)
appt rescheduled nothing further needed.Mary [name]

## 2012-10-02 ENCOUNTER — Ambulatory Visit: Payer: BC Managed Care – PPO | Admitting: Pulmonary Disease

## 2012-10-02 ENCOUNTER — Ambulatory Visit (INDEPENDENT_AMBULATORY_CARE_PROVIDER_SITE_OTHER): Payer: BC Managed Care – PPO | Admitting: Pulmonary Disease

## 2012-10-02 ENCOUNTER — Encounter: Payer: Self-pay | Admitting: Pulmonary Disease

## 2012-10-02 VITALS — BP 120/72 | HR 99 | Temp 97.2°F | Ht 64.0 in | Wt 153.8 lb

## 2012-10-02 DIAGNOSIS — J449 Chronic obstructive pulmonary disease, unspecified: Secondary | ICD-10-CM

## 2012-10-02 DIAGNOSIS — J4489 Other specified chronic obstructive pulmonary disease: Secondary | ICD-10-CM

## 2012-10-02 MED ORDER — ALBUTEROL SULFATE HFA 108 (90 BASE) MCG/ACT IN AERS: 2.0000 | INHALATION_SPRAY | Freq: Four times a day (QID) | RESPIRATORY_TRACT | Status: DC | PRN

## 2012-10-02 MED ORDER — TIOTROPIUM BROMIDE MONOHYDRATE 18 MCG IN CAPS: 18.0000 ug | ORAL_CAPSULE | Freq: Every day | RESPIRATORY_TRACT | Status: DC

## 2012-10-02 MED ORDER — DOXYCYCLINE HYCLATE 100 MG PO CAPS: 100.0000 mg | ORAL_CAPSULE | Freq: Two times a day (BID) | ORAL | Status: DC

## 2012-10-02 MED ORDER — PREDNISONE 10 MG PO TABS: ORAL_TABLET | ORAL | Status: DC

## 2012-10-02 MED ORDER — MONTELUKAST SODIUM 10 MG PO TABS: 10.0000 mg | ORAL_TABLET | Freq: Every day | ORAL | Status: DC

## 2012-10-02 MED ORDER — BUDESONIDE-FORMOTEROL FUMARATE 160-4.5 MCG/ACT IN AERO: 2.0000 | INHALATION_SPRAY | Freq: Two times a day (BID) | RESPIRATORY_TRACT | Status: DC

## 2012-10-02 MED ORDER — THEOPHYLLINE ER 100 MG PO CP24: 100.0000 mg | ORAL_CAPSULE | Freq: Every day | ORAL | Status: DC

## 2012-10-02 NOTE — Assessment & Plan Note (Signed)
Stable on her current regimen.  She is fairly savvy with regards to monitoring her asthma symptoms.  I have given her a script for prednisone and doxycycline that she is to keep handy.  I have advised her to call prior to her taking these medications.

## 2012-10-02 NOTE — Patient Instructions (Signed)
Follow up in 1 year Call if help needed sooner 

## 2012-10-02 NOTE — Progress Notes (Signed)
Chief Complaint  Patient presents with  . Follow-up    breathing is okay, occasional cough. denies any wheezing, chest tx. no complaints today.     History of Present Illness: Mary [name] is a 62 y.o. female with chronic obstructive asthma, allergic rhinitis, and exercise induced bronchoconstriction. Her IgE level was 327.6 from 02/28/08 with multiple allergens.  She is here for annual follow up.  She has been doing okay.  She has not needed albuterol recently.  She denies sinus problems.  She gets occasional hoarseness.  She is not having cough, wheeze, or chest congestion.  TESTS: - IgE 327.6  - PFT 02/28/08 FEV1 1.44(58%), FEV1% 48, TLC 5.25(99%), DLCO 76%  - PFT 02/05/10 FEV1 1.44(60%), FEV1% 51, TLC 5.22(99%), DLCO 78%, +BD  - Started xolair 08/14/08 and stopped 09/10 (pt intolerant of medication) - Spirometry 09/12/11>>FEV1 1.28(60%), FEV1% 55  Mary [name]  has a past medical history of Chronic obstructive asthma; Allergic rhinitis; GERD (gastroesophageal reflux disease); Exercise-induced bronchoconstriction; Osteoporosis; Esophageal stricture; Colon polyp; Pneumonia; and Hypothyroidism.  Mary [name]  has past surgical history that includes Esophageal dilation and Tubal ligation.  Prior to Admission medications   Medication Sig Start Date End Date Taking? Authorizing Provider  albuterol (PROAIR HFA) 108 (90 BASE) MCG/ACT inhaler Inhale 2 puffs into the lungs every 6 (six) hours as needed. 03/16/11  Yes Coralyn Helling, MD  budesonide-formoterol (SYMBICORT) 160-4.5 MCG/ACT inhaler Inhale 2 puffs into the lungs 2 (two) times daily. 04/05/12  Yes Coralyn Helling, MD  levothyroxine (SYNTHROID, LEVOTHROID) 88 MCG tablet Take 88 mcg by mouth daily.     Yes Historical Provider, MD  montelukast (SINGULAIR) 10 MG tablet Take 1 tablet (10 mg total) by mouth at bedtime. 04/05/12 04/05/13 Yes Coralyn Helling, MD  Multiple Vitamin (MULTIVITAMIN) capsule Take 1 capsule by mouth daily.     Yes  Historical Provider, MD  omeprazole (PRILOSEC) 20 MG capsule Take 1 capsule (20 mg total) by mouth 2 (two) times daily. 30 minutes before the first and last meal of the day 06/22/12  Yes Coralyn Helling, MD  theophylline (THEO-24) 100 MG 24 hr capsule Take 1 capsule (100 mg total) by mouth daily. 04/05/12  Yes Coralyn Helling, MD  tiotropium (SPIRIVA) 18 MCG inhalation capsule Place 1 capsule (18 mcg total) into inhaler and inhale daily. 04/05/12  Yes Coralyn Helling, MD    No Known Allergies   Physical Exam:  General - No distress ENT - No sinus tenderness, no oral exudate, no LAN Cardiac - s1s2 regular, no murmur Chest - No wheeze/rales/dullness Back - No focal tenderness Abd - Soft, non-tender Ext - No edema Neuro - Normal strength Skin - No rashes Psych - normal mood, and behavior   Assessment/Plan:  Coralyn Helling, MD [place] [facility]/Critical [facility]/Sleep Pager:  [phone] 10/02/2012, 10:44 AM

## 2012-10-19 ENCOUNTER — Ambulatory Visit (HOSPITAL_COMMUNITY): Payer: BC Managed Care – PPO | Attending: Obstetrics and Gynecology

## 2012-10-29 ENCOUNTER — Other Ambulatory Visit (HOSPITAL_COMMUNITY): Payer: Self-pay | Admitting: Obstetrics and Gynecology

## 2012-10-29 DIAGNOSIS — Z1231 Encounter for screening mammogram for malignant neoplasm of breast: Secondary | ICD-10-CM

## 2012-10-31 ENCOUNTER — Ambulatory Visit (HOSPITAL_COMMUNITY)
Admission: RE | Admit: 2012-10-31 | Discharge: 2012-10-31 | Disposition: A | Discharge status: Home / Self Care | Payer: BC Managed Care – PPO | Source: Ambulatory Visit | Attending: Obstetrics and Gynecology | Admitting: Obstetrics and Gynecology

## 2012-10-31 DIAGNOSIS — Z1231 Encounter for screening mammogram for malignant neoplasm of breast: Secondary | ICD-10-CM

## 2012-11-21 ENCOUNTER — Telehealth: Payer: Self-pay | Admitting: Pulmonary Disease

## 2012-11-21 NOTE — Telephone Encounter (Signed)
Spoke with pharmacy-- according to our record Rx was sent 10/02/12 #1 with 11 refills. Pharmacist has verified this and nothing further needed at this time.

## 2013-03-22 ENCOUNTER — Telehealth: Payer: Self-pay | Admitting: Internal Medicine

## 2013-03-22 ENCOUNTER — Telehealth: Payer: Self-pay | Admitting: Pulmonary Disease

## 2013-03-22 NOTE — Telephone Encounter (Signed)
Noted  

## 2013-03-22 NOTE — Telephone Encounter (Signed)
Spoke with patient-states she was to call and let VS know when she started taking her pred taper rx he gave her to hold. Pt stated she started having a flare up yesterday and started the pred taper. Pt aware if she does not feel any better after completing the rx then to call our office for other recs or OV.  Will forward to VS as FYI.

## 2013-03-22 NOTE — Telephone Encounter (Signed)
error 

## 2013-03-26 ENCOUNTER — Ambulatory Visit (INDEPENDENT_AMBULATORY_CARE_PROVIDER_SITE_OTHER): Payer: BC Managed Care – PPO | Admitting: Critical Care Medicine

## 2013-03-26 ENCOUNTER — Encounter: Payer: Self-pay | Admitting: Critical Care Medicine

## 2013-03-26 VITALS — BP 142/90 | HR 83 | Temp 98.0°F | Ht 66.0 in | Wt 161.6 lb

## 2013-03-26 DIAGNOSIS — J449 Chronic obstructive pulmonary disease, unspecified: Secondary | ICD-10-CM

## 2013-03-26 MED ORDER — CEFUROXIME AXETIL 500 MG PO TABS: 500.0000 mg | ORAL_TABLET | Freq: Two times a day (BID) | ORAL | Status: DC

## 2013-03-26 MED ORDER — ALBUTEROL SULFATE HFA 108 (90 BASE) MCG/ACT IN AERS: 2.0000 | INHALATION_SPRAY | Freq: Four times a day (QID) | RESPIRATORY_TRACT | Status: DC | PRN

## 2013-03-26 NOTE — Assessment & Plan Note (Signed)
Chronic obstructive lung disease with associated asthmatic bronchitic component now with acute flare Note the patient's HFA technique is quite poor and with instruction increase to 75% efficiency however a spacer device would be beneficial Plan Stop doxycycline Start Cefuroxime 500mg  twice daily for 5days Finish prednisone Use spacer on symbicort, work on inhaler technique Stay on spiriva Return 2 weeks for recheck with Dr Craige Cotta

## 2013-03-26 NOTE — Progress Notes (Signed)
Subjective:    Patient ID: Mary [name], female    DOB: 06/11/1951, 62 y.o.   MRN: [id number]  HPI Chief Complaint  Patient presents with  . Follow-up    breathing is okay, occasional cough. denies any wheezing, chest tx. no complaints today.     History of Present Illness: Mary [name] is a 62 y.o. female with chronic obstructive asthma, allergic rhinitis, and exercise induced bronchoconstriction. Her IgE level was 327.6 from 02/28/08 with multiple allergens.  She is here for annual follow up.  She has been doing okay.  She has not needed albuterol recently.  She denies sinus problems.  She gets occasional hoarseness.  She is not having cough, wheeze, or chest congestion.  TESTS: - IgE 327.6  - PFT 02/28/08 FEV1 1.44(58%), FEV1% 48, TLC 5.25(99%), DLCO 76%  - PFT 02/05/10 FEV1 1.44(60%), FEV1% 51, TLC 5.22(99%), DLCO 78%, +BD  - Started xolair 08/14/08 and stopped 09/10 (pt intolerant of medication) - Spirometry 09/12/11>>FEV1 1.28(60%), FEV1% 55  03/26/2013 Chief Complaint  Patient presents with  . Acute Visit    VS pt.  Started doxy and pred taper on Thursday but symptoms aren't improved.  C/o chest tightness, discomfort in back, chills, increased DOE, fatigue, and occas nonprod cough.     Filled doxy/pred.  Symptoms: dyspnea, pain in back, congestion, mucinex started and moved a bit and coughing green mucus.  Not feeling well. Taking pred: 4x 2 and down Now on 2/d and last day.  Manicky and edgy on steroids On doxy: 7days : has a few days left Review of Systems Constitutional:   No  weight loss, night sweats,  Fevers, chills, fatigue, lassitude. HEENT:   No headaches,  Difficulty swallowing,  Tooth/dental problems,  Sore throat,                No sneezing, itching, ear ache, nasal congestion, post nasal drip,   CV:  No chest pain,  Orthopnea, PND, swelling in lower extremities, anasarca, dizziness, palpitations  GI  No heartburn, indigestion, abdominal pain, nausea,  vomiting, diarrhea, change in bowel habits, loss of appetite  Resp: +++ shortness of breath with exertion or at rest.  ++ excess mucus, ++ productive cough,  ++ non-productive cough,  No coughing up of blood.  ++ change in color of mucus. ++wheezing.  No chest wall deformity  Skin: no rash or lesions.  GU: no dysuria, change in color of urine, no urgency or frequency.  No flank pain.  MS:  No joint pain or swelling.  No decreased range of motion.  No back pain.  Psych:  No change in mood or affect. No depression or anxiety.  No memory loss.     Objective:   Physical Exam Filed Vitals:   03/26/13 1048  BP: 142/90  Pulse: 83  Temp: 98 F (36.7 C)  TempSrc: Oral  Height: 5\' 6"  (1.676 m)  Weight: 161 lb 9.6 oz (73.301 kg)  SpO2: 97%    Gen: Pleasant, well-nourished, in no distress,  normal affect  ENT: No lesions,  mouth clear,  oropharynx clear, no postnasal drip  Neck: No JVD, no TMG, no carotid bruits  Lungs: No use of accessory muscles, no dullness to percussion, expired wheezes   Cardiovascular: RRR, heart sounds normal, no murmur or gallops, no peripheral edema  Abdomen: soft and NT, no HSM,  BS normal  Musculoskeletal: No deformities, no cyanosis or clubbing  Neuro: alert, non focal  Skin: Warm, no lesions or rashes  No results found.        Assessment & Plan:   COPD with asthma Chronic obstructive lung disease with associated asthmatic bronchitic component now with acute flare Note the patient's HFA technique is quite poor and with instruction increase to 75% efficiency however a spacer device would be beneficial Plan Stop doxycycline Start Cefuroxime 500mg  twice daily for 5days Finish prednisone Use spacer on symbicort, work on inhaler technique Stay on spiriva Return 2 weeks for recheck with Dr Craige Cotta    Updated Medication List Outpatient Encounter Prescriptions as of 03/26/2013  Medication Sig Dispense Refill  . albuterol (PROAIR HFA) 108 (90  BASE) MCG/ACT inhaler Inhale 2 puffs into the lungs every 6 (six) hours as needed.  1 Inhaler  3  . budesonide-formoterol (SYMBICORT) 160-4.5 MCG/ACT inhaler Inhale 2 puffs into the lungs 2 (two) times daily.  1 Inhaler  11  . guaiFENesin (MUCINEX) 600 MG 12 hr tablet Take 1,200 mg by mouth daily.      Marland Kitchen levothyroxine (SYNTHROID, LEVOTHROID) 88 MCG tablet Take 88 mcg by mouth daily.        . montelukast (SINGULAIR) 10 MG tablet Take 1 tablet (10 mg total) by mouth at bedtime.  30 tablet  11  . Multiple Vitamin (MULTIVITAMIN) capsule Take 1 capsule by mouth daily.        Marland Kitchen omeprazole (PRILOSEC) 20 MG capsule Take 1 capsule (20 mg total) by mouth 2 (two) times daily. 30 minutes before the first and last meal of the day  60 capsule  3  . predniSONE (DELTASONE) 10 MG tablet 4 pills for 2 days, 3 pills for 2 days, 2 pills for 2 days, 1 pill for 2 days  20 tablet  0  . theophylline (THEO-24) 100 MG 24 hr capsule Take 1 capsule (100 mg total) by mouth daily.  30 capsule  11  . tiotropium (SPIRIVA) 18 MCG inhalation capsule Place 1 capsule (18 mcg total) into inhaler and inhale daily.  30 capsule  11  . [DISCONTINUED] albuterol (PROAIR HFA) 108 (90 BASE) MCG/ACT inhaler Inhale 2 puffs into the lungs every 6 (six) hours as needed.  1 Inhaler  11  . [DISCONTINUED] doxycycline (VIBRAMYCIN) 100 MG capsule Take 1 capsule (100 mg total) by mouth 2 (two) times daily.  20 capsule  0  . cefUROXime (CEFTIN) 500 MG tablet Take 1 tablet (500 mg total) by mouth 2 (two) times daily.  10 tablet  0   No facility-administered encounter medications on file as of 03/26/2013.

## 2013-03-26 NOTE — Patient Instructions (Addendum)
Stop doxycycline Start Cefuroxime 500mg  twice daily for 5days Finish prednisone Use spacer on symbicort, work on inhaler technique Stay on spiriva Return 2 weeks for recheck with Dr Craige Cotta

## 2013-04-10 ENCOUNTER — Encounter: Payer: Self-pay | Admitting: Pulmonary Disease

## 2013-04-10 ENCOUNTER — Ambulatory Visit (INDEPENDENT_AMBULATORY_CARE_PROVIDER_SITE_OTHER): Payer: BC Managed Care – PPO | Admitting: Pulmonary Disease

## 2013-04-10 VITALS — BP 110/76 | HR 90 | Ht 65.0 in | Wt 156.0 lb

## 2013-04-10 DIAGNOSIS — R0789 Other chest pain: Secondary | ICD-10-CM

## 2013-04-10 DIAGNOSIS — J449 Chronic obstructive pulmonary disease, unspecified: Secondary | ICD-10-CM

## 2013-04-10 MED ORDER — PREDNISONE 10 MG PO TABS: ORAL_TABLET | ORAL | Status: DC

## 2013-04-10 NOTE — Assessment & Plan Note (Signed)
Clinically improved after recent exacerbation.  I have refilled her script for prednisone she can start if she gets another flare.  She is to continue symbicort, theophylline, spiriva, and singulair.

## 2013-04-10 NOTE — Assessment & Plan Note (Signed)
She reported having heavy chest pressure during her asthma exacerbation.  This has improved.  Advised her to discuss with her PCP if her symptoms require in absence of asthma flare.

## 2013-04-10 NOTE — Patient Instructions (Signed)
Follow up in 1 year.

## 2013-04-10 NOTE — Progress Notes (Signed)
Chief Complaint  Patient presents with  . Follow-up    Saw PW 2 weeks ago. States that she feels much better since finishing pred taper and abx.    History of Present Illness: Mary [name] is a 62 y.o. female with chronic obstructive asthma, allergic rhinitis, and exercise induced bronchoconstriction. Her IgE level was 327.6 from 02/28/08 with multiple allergens.  She was seen by Dr. Delford Field in august for asthma flare.  She was treated with prednisone and Abx.  She is feeling better.  She is not having as much cough, wheeze, or sputum.  She was getting heavy, pressure feeling in her chest, but this is better.  She is not having sinus congestion or fever.  She feels spacer device has helped with her inhalers.  TESTS: - IgE 327.6  - PFT 02/28/08 FEV1 1.44(58%), FEV1% 48, TLC 5.25(99%), DLCO 76%  - PFT 02/05/10 FEV1 1.44(60%), FEV1% 51, TLC 5.22(99%), DLCO 78%, +BD  - Started xolair 08/14/08 and stopped 09/10 (pt intolerant of medication) - Spirometry 09/12/11>>FEV1 1.28(60%), FEV1% 55  Mary [name]  has a past medical history of Chronic obstructive asthma; Allergic rhinitis; GERD (gastroesophageal reflux disease); Exercise-induced bronchoconstriction; Osteoporosis; Esophageal stricture; Colon polyp; Pneumonia; and Hypothyroidism.  Mary [name]  has past surgical history that includes Esophageal dilation and Tubal ligation.  Prior to Admission medications   Medication Sig Start Date End Date Taking? Authorizing Provider  albuterol (PROAIR HFA) 108 (90 BASE) MCG/ACT inhaler Inhale 2 puffs into the lungs every 6 (six) hours as needed. 03/16/11  Yes Coralyn Helling, MD  budesonide-formoterol (SYMBICORT) 160-4.5 MCG/ACT inhaler Inhale 2 puffs into the lungs 2 (two) times daily. 04/05/12  Yes Coralyn Helling, MD  levothyroxine (SYNTHROID, LEVOTHROID) 88 MCG tablet Take 88 mcg by mouth daily.     Yes Historical Provider, MD  montelukast (SINGULAIR) 10 MG tablet Take 1 tablet (10 mg total) by  mouth at bedtime. 04/05/12 04/05/13 Yes Coralyn Helling, MD  Multiple Vitamin (MULTIVITAMIN) capsule Take 1 capsule by mouth daily.     Yes Historical Provider, MD  omeprazole (PRILOSEC) 20 MG capsule Take 1 capsule (20 mg total) by mouth 2 (two) times daily. 30 minutes before the first and last meal of the day 06/22/12  Yes Coralyn Helling, MD  theophylline (THEO-24) 100 MG 24 hr capsule Take 1 capsule (100 mg total) by mouth daily. 04/05/12  Yes Coralyn Helling, MD  tiotropium (SPIRIVA) 18 MCG inhalation capsule Place 1 capsule (18 mcg total) into inhaler and inhale daily. 04/05/12  Yes Coralyn Helling, MD    No Known Allergies   Physical Exam:  General - No distress ENT - No sinus tenderness, no oral exudate, no LAN Cardiac - s1s2 regular, no murmur Chest - No wheeze/rales/dullness Back - No focal tenderness Abd - Soft, non-tender Ext - No edema Neuro - Normal strength Skin - No rashes Psych - normal mood, and behavior   Assessment/Plan:  Coralyn Helling, MD [place] [facility]/Critical Care/Sleep Pager:  [phone] 04/10/2013, 11:32 AM

## 2013-06-10 ENCOUNTER — Other Ambulatory Visit: Payer: Self-pay | Admitting: Pulmonary Disease

## 2013-10-07 ENCOUNTER — Other Ambulatory Visit: Payer: Self-pay | Admitting: *Deleted

## 2013-10-07 DIAGNOSIS — J449 Chronic obstructive pulmonary disease, unspecified: Secondary | ICD-10-CM

## 2013-10-07 MED ORDER — THEOPHYLLINE ER 100 MG PO CP24: 100.0000 mg | ORAL_CAPSULE | Freq: Every day | ORAL | Status: DC

## 2013-10-10 ENCOUNTER — Other Ambulatory Visit: Payer: Self-pay | Admitting: *Deleted

## 2013-10-10 DIAGNOSIS — J449 Chronic obstructive pulmonary disease, unspecified: Secondary | ICD-10-CM

## 2013-10-10 MED ORDER — TIOTROPIUM BROMIDE MONOHYDRATE 18 MCG IN CAPS: 18.0000 ug | ORAL_CAPSULE | Freq: Every day | RESPIRATORY_TRACT | Status: DC

## 2013-10-10 MED ORDER — BUDESONIDE-FORMOTEROL FUMARATE 160-4.5 MCG/ACT IN AERO: 2.0000 | INHALATION_SPRAY | Freq: Two times a day (BID) | RESPIRATORY_TRACT | Status: DC

## 2013-10-28 ENCOUNTER — Other Ambulatory Visit: Payer: Self-pay | Admitting: Pulmonary Disease

## 2013-11-05 ENCOUNTER — Other Ambulatory Visit (HOSPITAL_COMMUNITY): Payer: Self-pay | Admitting: Obstetrics and Gynecology

## 2013-11-05 DIAGNOSIS — Z1231 Encounter for screening mammogram for malignant neoplasm of breast: Secondary | ICD-10-CM

## 2013-11-07 ENCOUNTER — Ambulatory Visit (HOSPITAL_COMMUNITY)
Admission: RE | Admit: 2013-11-07 | Discharge: 2013-11-07 | Disposition: A | Discharge status: Home / Self Care | Payer: Medicare Other | Source: Ambulatory Visit | Attending: Obstetrics and Gynecology | Admitting: Obstetrics and Gynecology

## 2013-11-07 DIAGNOSIS — Z1231 Encounter for screening mammogram for malignant neoplasm of breast: Secondary | ICD-10-CM | POA: Insufficient documentation

## 2013-11-26 ENCOUNTER — Other Ambulatory Visit: Payer: Self-pay | Admitting: Pulmonary Disease

## 2013-12-09 ENCOUNTER — Telehealth: Payer: Self-pay | Admitting: Pulmonary Disease

## 2013-12-09 DIAGNOSIS — J449 Chronic obstructive pulmonary disease, unspecified: Secondary | ICD-10-CM

## 2013-12-09 MED ORDER — BUDESONIDE-FORMOTEROL FUMARATE 160-4.5 MCG/ACT IN AERO: 2.0000 | INHALATION_SPRAY | Freq: Two times a day (BID) | RESPIRATORY_TRACT | Status: DC

## 2013-12-09 MED ORDER — TIOTROPIUM BROMIDE MONOHYDRATE 18 MCG IN CAPS: 18.0000 ug | ORAL_CAPSULE | Freq: Every day | RESPIRATORY_TRACT | Status: DC

## 2013-12-09 MED ORDER — ALBUTEROL SULFATE HFA 108 (90 BASE) MCG/ACT IN AERS: 2.0000 | INHALATION_SPRAY | Freq: Four times a day (QID) | RESPIRATORY_TRACT | Status: DC | PRN

## 2013-12-09 MED ORDER — THEOPHYLLINE ER 100 MG PO CP24: 100.0000 mg | ORAL_CAPSULE | Freq: Every day | ORAL | Status: DC

## 2013-12-09 NOTE — Telephone Encounter (Signed)
Spoke with the pt  She states that she is needing refills on proair,  theo 24, spiriva and symbicort and singulair sent to [facility]  Rxs were sent  Nothing further needed

## 2013-12-10 ENCOUNTER — Other Ambulatory Visit: Payer: Self-pay | Admitting: Emergency Medicine

## 2013-12-10 MED ORDER — MONTELUKAST SODIUM 10 MG PO TABS: ORAL_TABLET | ORAL | Status: DC

## 2013-12-10 NOTE — Telephone Encounter (Signed)
Received rx refill request from [facility] for singulair and theo-24. Theo-24 was already filled on 12/09/13 and not needing refill at this time. Singulair was last filled on 4/28 for #30 and 0 refills from [facility]. Sent Singulair for #90 and 1 refill to [facility]. Nothing further needed at this time.

## 2013-12-11 ENCOUNTER — Other Ambulatory Visit: Payer: Self-pay | Admitting: *Deleted

## 2014-01-16 ENCOUNTER — Telehealth: Payer: Self-pay | Admitting: Pulmonary Disease

## 2014-01-16 NOTE — Telephone Encounter (Signed)
lmomtcb x1 

## 2014-01-16 NOTE — Telephone Encounter (Signed)
She did not get a call back Albuterol MDI called in  To [facility]

## 2014-01-31 ENCOUNTER — Other Ambulatory Visit: Payer: Self-pay | Admitting: Pulmonary Disease

## 2014-02-24 ENCOUNTER — Other Ambulatory Visit: Payer: Self-pay | Admitting: Pulmonary Disease

## 2014-03-26 ENCOUNTER — Other Ambulatory Visit: Payer: Self-pay | Admitting: Pulmonary Disease

## 2014-03-26 MED ORDER — OMEPRAZOLE 20 MG PO CPDR: DELAYED_RELEASE_CAPSULE | ORAL | Status: DC

## 2014-03-27 DIAGNOSIS — K635 Polyp of colon: Secondary | ICD-10-CM | POA: Insufficient documentation

## 2014-03-27 DIAGNOSIS — K222 Esophageal obstruction: Secondary | ICD-10-CM | POA: Insufficient documentation

## 2014-03-27 DIAGNOSIS — J189 Pneumonia, unspecified organism: Secondary | ICD-10-CM | POA: Insufficient documentation

## 2014-03-27 DIAGNOSIS — E039 Hypothyroidism, unspecified: Secondary | ICD-10-CM | POA: Insufficient documentation

## 2014-03-27 DIAGNOSIS — J45990 Exercise induced bronchospasm: Secondary | ICD-10-CM | POA: Insufficient documentation

## 2014-04-11 ENCOUNTER — Telehealth: Payer: Self-pay | Admitting: Pulmonary Disease

## 2014-04-11 MED ORDER — MONTELUKAST SODIUM 10 MG PO TABS: ORAL_TABLET | ORAL | Status: DC

## 2014-04-11 NOTE — Telephone Encounter (Signed)
Called and spoke with pt and she stated that the singulair is on the way to her and she will run out.  She requested that a few be sent to her local pharmacy.  This has been done and nothing further is needed.

## 2014-04-15 ENCOUNTER — Ambulatory Visit (INDEPENDENT_AMBULATORY_CARE_PROVIDER_SITE_OTHER)
Admission: RE | Admit: 2014-04-15 | Discharge: 2014-04-15 | Disposition: A | Discharge status: Home / Self Care | Payer: Medicare Other | Source: Ambulatory Visit | Attending: Pulmonary Disease | Admitting: Pulmonary Disease

## 2014-04-15 ENCOUNTER — Encounter (INDEPENDENT_AMBULATORY_CARE_PROVIDER_SITE_OTHER): Payer: Self-pay

## 2014-04-15 ENCOUNTER — Encounter: Payer: Self-pay | Admitting: Pulmonary Disease

## 2014-04-15 ENCOUNTER — Ambulatory Visit (INDEPENDENT_AMBULATORY_CARE_PROVIDER_SITE_OTHER): Payer: Medicare Other | Admitting: Pulmonary Disease

## 2014-04-15 VITALS — BP 102/70 | HR 88 | Temp 97.3°F | Ht 65.0 in | Wt 166.0 lb

## 2014-04-15 DIAGNOSIS — Z129 Encounter for screening for malignant neoplasm, site unspecified: Secondary | ICD-10-CM

## 2014-04-15 DIAGNOSIS — Z87891 Personal history of nicotine dependence: Secondary | ICD-10-CM

## 2014-04-15 DIAGNOSIS — R0789 Other chest pain: Secondary | ICD-10-CM

## 2014-04-15 DIAGNOSIS — J449 Chronic obstructive pulmonary disease, unspecified: Secondary | ICD-10-CM

## 2014-04-15 DIAGNOSIS — J4489 Other specified chronic obstructive pulmonary disease: Secondary | ICD-10-CM

## 2014-04-15 DIAGNOSIS — R05 Cough: Secondary | ICD-10-CM

## 2014-04-15 DIAGNOSIS — R059 Cough, unspecified: Secondary | ICD-10-CM

## 2014-04-15 DIAGNOSIS — R058 Other specified cough: Secondary | ICD-10-CM

## 2014-04-15 MED ORDER — FLUTICASONE PROPIONATE 50 MCG/ACT NA SUSP: 2.0000 | Freq: Every day | NASAL | Status: DC

## 2014-04-15 NOTE — Progress Notes (Signed)
Chief Complaint  Patient presents with  . Follow-up    1 year > Pt c/o cough, throat congestion, hoarseness and discomfort is right chest and back. Pt states that she has been using Albuterol more frequent.     History of Present Illness: Mary [name] is a 63 y.o. female with remote hx of smoking and chronic obstructive asthma, allergic rhinitis, and exercise induced bronchoconstriction.  She has noticed progressively more trouble with her breathing.  She thinks this is just going to be her new normal.  She is using albuterol on a more regular basis.  She gets a cough and throat congestion.  She has nasal congestion sometimes.  She denies fever or sinus pain.  She gets pain in her back and around her chest.  She feels better after she exercises, but only can do this 3 days per week.    Her daughter, Mary [name] (Cell [phone], Work [phone]), works in thoracic oncology department at [employer], and was question her mother about whether she need to have CT scan of chest to screen for lung cancer.    She smoked 1 pack per day, and quit at age 39 after the birth of 7.  TESTS: RAST 02/28/08 >> IgE 327.6, multiple allergens PFT 02/28/08 >> FEV1 1.44(58%), FEV1% 48, TLC 5.25(99%), DLCO 76%  PFT 02/05/10 >> FEV1 1.44(60%), FEV1% 51, TLC 5.22(99%), DLCO 78%, +BD  Started xolair 08/14/08 >> stopped 09/10 (pt intolerant of medication) Spirometry 09/12/11 >> FEV1 1.28(60%), FEV1% 55  PMHx, PSHx, Medications, Allergies, Fhx, Shx reviewed.  Physical Exam:  General - No distress ENT - boggy nasal mucosa, clear to yellow nasal drainage, no sinus tenderness, no oral exudate, no LAN Cardiac - s1s2 regular, no murmur Chest - decreased breath sounds, no wheeze Back - No focal tenderness Abd - Soft, non-tender Ext - No edema Neuro - Normal strength Skin - No rashes Psych - normal mood, and behavior   Assessment/Plan:  Coralyn Helling, MD [place] [facility]/Critical  Care/Sleep Pager:  [phone] 04/15/2014, 11:53 AM

## 2014-04-15 NOTE — Patient Instructions (Signed)
Chest xray today Try nasal irrigation (salt water spray for nose) daily  Flonase (fluticasone) two sprays each nostril daily Follow up in 1 year

## 2014-04-16 ENCOUNTER — Telehealth: Payer: Self-pay | Admitting: Pulmonary Disease

## 2014-04-16 DIAGNOSIS — R0600 Dyspnea, unspecified: Secondary | ICD-10-CM

## 2014-04-16 DIAGNOSIS — R0789 Other chest pain: Secondary | ICD-10-CM

## 2014-04-16 DIAGNOSIS — Z129 Encounter for screening for malignant neoplasm, site unspecified: Secondary | ICD-10-CM | POA: Insufficient documentation

## 2014-04-16 DIAGNOSIS — R05 Cough: Secondary | ICD-10-CM | POA: Insufficient documentation

## 2014-04-16 DIAGNOSIS — R058 Other specified cough: Secondary | ICD-10-CM | POA: Insufficient documentation

## 2014-04-16 DIAGNOSIS — R091 Pleurisy: Secondary | ICD-10-CM

## 2014-04-16 NOTE — Assessment & Plan Note (Signed)
Related to allergic rhinitis, and post-nasal drip.  Will have her use nasal irrigation and flonase.

## 2014-04-16 NOTE — Telephone Encounter (Signed)
Dg Chest 2 View  04/15/2014   CLINICAL DATA:  Annual physical.  Cough and shortness of breath.  EXAM: CHEST  2 VIEW  COMPARISON:  None.  FINDINGS: Cardiac silhouette is normal in size. The aorta is uncoiled and mildly tortuous. No mediastinal or hilar masses or evidence of adenopathy. The lungs are hyperexpanded but clear. No pleural effusion or pneumothorax.  Bony thorax is demineralized but intact.  IMPRESSION: 1. No acute cardiopulmonary disease. Hyperexpanded lungs. Stable appearance from the prior study.   Electronically Signed   By: Amie Portland M.D.   On: 04/15/2014 14:09    Will have my nurse inform pt that CXR did not show anything to explain the cause of her chest pain.  I have ordered CT chest with IV contrast to further assess.  Will call back after CT chest reviewed.

## 2014-04-16 NOTE — Telephone Encounter (Signed)
Results have been explained to patient, pt expressed understanding.  Call transferred back to [facility] to schedule CT. Will send message back to [facility] to document appt date/time. (message can be closed once this documented)

## 2014-04-16 NOTE — Assessment & Plan Note (Signed)
Not sure whether this is related to her asthma.  Will get CXR >> if unrevealing will then need CT chest with contrast to further assess.

## 2014-04-16 NOTE — Telephone Encounter (Signed)
Pt advised that she needed to come by the [facility] office tomorrow and have lab work drawn. Pt requested Friday 04/18/14 at 3:00 for her CTA Chest at [facility]. Pt is aware to arrive at 2:45, NPO 2 hours before. Order given to [facility] to obtain precert. Pt is aware to come for labs on Thurs. 04/17/14 and CTA Chest on Friday. Nothing else needed at this time. Rhonda J Cobb

## 2014-04-16 NOTE — Telephone Encounter (Signed)
lmtcb x1 

## 2014-04-16 NOTE — Assessment & Plan Note (Signed)
I d/w Ms. Thomspon and spoke with pt's daughter Valli Glance over the phone about protocol for lung cancer screening with low dose CT chest.

## 2014-04-16 NOTE — Assessment & Plan Note (Signed)
She is to continue spiriva, symbicort, singulair, and theophylline with prn albuterol.

## 2014-04-16 NOTE — Telephone Encounter (Signed)
LMOAM both home and cell for pt to return my call. Pt will need to speak with either Ashtyn or triage to be given the results of CXR and then transfer to me to schedule CTA Chest. Ollen Gross

## 2014-04-17 ENCOUNTER — Other Ambulatory Visit (INDEPENDENT_AMBULATORY_CARE_PROVIDER_SITE_OTHER): Payer: Medicare Other

## 2014-04-17 DIAGNOSIS — R0600 Dyspnea, unspecified: Secondary | ICD-10-CM

## 2014-04-17 DIAGNOSIS — R0989 Other specified symptoms and signs involving the circulatory and respiratory systems: Secondary | ICD-10-CM

## 2014-04-17 DIAGNOSIS — R0789 Other chest pain: Secondary | ICD-10-CM

## 2014-04-17 DIAGNOSIS — R091 Pleurisy: Secondary | ICD-10-CM

## 2014-04-17 DIAGNOSIS — R0609 Other forms of dyspnea: Secondary | ICD-10-CM

## 2014-04-17 LAB — BASIC METABOLIC PANEL
BUN: 11 mg/dL (ref 6–23)
CO2: 26 mEq/L (ref 19–32)
Calcium: 9.2 mg/dL (ref 8.4–10.5)
Chloride: 105 mEq/L (ref 96–112)
Creatinine, Ser: 0.8 mg/dL (ref 0.4–1.2)
GFR: 99.6 mL/min (ref >60.00)
Glucose, Bld: 103 mg/dL — ABNORMAL HIGH (ref 70–99)
POTASSIUM: 4 meq/L (ref 3.5–5.1)
SODIUM: 138 meq/L (ref 135–145)

## 2014-04-18 ENCOUNTER — Ambulatory Visit (INDEPENDENT_AMBULATORY_CARE_PROVIDER_SITE_OTHER)
Admission: RE | Admit: 2014-04-18 | Discharge: 2014-04-18 | Disposition: A | Discharge status: Home / Self Care | Payer: Medicare Other | Source: Ambulatory Visit | Attending: Pulmonary Disease | Admitting: Pulmonary Disease

## 2014-04-18 DIAGNOSIS — R0989 Other specified symptoms and signs involving the circulatory and respiratory systems: Secondary | ICD-10-CM

## 2014-04-18 DIAGNOSIS — R091 Pleurisy: Secondary | ICD-10-CM

## 2014-04-18 DIAGNOSIS — R0789 Other chest pain: Secondary | ICD-10-CM

## 2014-04-18 DIAGNOSIS — R0609 Other forms of dyspnea: Secondary | ICD-10-CM

## 2014-04-18 DIAGNOSIS — R0600 Dyspnea, unspecified: Secondary | ICD-10-CM

## 2014-04-18 MED ORDER — IOHEXOL 350 MG/ML SOLN
100.0000 mL | Freq: Once | INTRAVENOUS | Status: AC | PRN
  Administered 2014-04-18: 100 mL via INTRAVENOUS

## 2014-04-22 ENCOUNTER — Telehealth: Payer: Self-pay | Admitting: Pulmonary Disease

## 2014-04-22 NOTE — Telephone Encounter (Signed)
CT chest 04/18/14 >> centrilobular emphysema  Will have my nurse inform pt that CT chest showed subtle changes of emphysema in her upper lungs from previous history of smoking.  No evidence for lung cancer.  She is to continue her current inhaler regimen.  No other changes needed at this time.

## 2014-04-24 NOTE — Telephone Encounter (Signed)
Results have been explained to patient, pt expressed understanding. Nothing further needed.  

## 2014-05-12 ENCOUNTER — Other Ambulatory Visit: Payer: Self-pay | Admitting: Pulmonary Disease

## 2014-05-16 ENCOUNTER — Other Ambulatory Visit: Payer: Self-pay

## 2014-10-17 ENCOUNTER — Other Ambulatory Visit (HOSPITAL_COMMUNITY): Payer: Self-pay | Admitting: Obstetrics and Gynecology

## 2014-10-17 DIAGNOSIS — Z1231 Encounter for screening mammogram for malignant neoplasm of breast: Secondary | ICD-10-CM

## 2014-11-10 ENCOUNTER — Ambulatory Visit (HOSPITAL_COMMUNITY): Payer: Medicare Other | Attending: Obstetrics and Gynecology

## 2014-11-12 ENCOUNTER — Ambulatory Visit (HOSPITAL_COMMUNITY)
Admission: RE | Admit: 2014-11-12 | Discharge: 2014-11-12 | Disposition: A | Discharge status: Home / Self Care | Payer: Medicare Other | Source: Ambulatory Visit | Attending: Obstetrics and Gynecology | Admitting: Obstetrics and Gynecology

## 2014-11-12 ENCOUNTER — Ambulatory Visit (HOSPITAL_COMMUNITY): Payer: Medicare Other

## 2014-11-12 DIAGNOSIS — Z1231 Encounter for screening mammogram for malignant neoplasm of breast: Secondary | ICD-10-CM | POA: Insufficient documentation

## 2014-11-24 ENCOUNTER — Other Ambulatory Visit: Payer: Self-pay | Admitting: Pulmonary Disease

## 2014-11-27 ENCOUNTER — Encounter: Payer: Self-pay | Admitting: Gastroenterology

## 2014-12-04 ENCOUNTER — Ambulatory Visit (INDEPENDENT_AMBULATORY_CARE_PROVIDER_SITE_OTHER): Payer: Medicare Other | Admitting: Pulmonary Disease

## 2014-12-04 ENCOUNTER — Encounter: Payer: Self-pay | Admitting: Pulmonary Disease

## 2014-12-04 VITALS — BP 110/72 | HR 68 | Temp 97.9°F | Ht 64.0 in | Wt 167.0 lb

## 2014-12-04 DIAGNOSIS — J449 Chronic obstructive pulmonary disease, unspecified: Secondary | ICD-10-CM | POA: Diagnosis not present

## 2014-12-04 DIAGNOSIS — R0602 Shortness of breath: Secondary | ICD-10-CM

## 2014-12-04 MED ORDER — PREDNISONE 10 MG PO TABS
ORAL_TABLET | ORAL | Status: DC
Start: 2014-12-04 — End: 2015-01-30

## 2014-12-04 NOTE — Progress Notes (Signed)
   Subjective:    Patient ID: Mary [name], female    DOB: 12/19/1950, 64 y.o.   MRN: [id number]  HPI  64 y.o. female with remote hx of smoking and chronic obstructive asthma, allergic rhinitis, and exercise induced bronchoconstriction.   Her daughter, De HollingsheadJillian Cinquemani (Cell [phone], Work [phone]), works in thoracic oncology department at [employer],         She smoked 1 pack per day, and quit at age 64   TESTS: RAST 02/28/08 >> IgE 327.6, multiple allergens PFT 02/28/08 >> FEV1 1.44(58%), FEV1% 48, TLC 5.25(99%), DLCO 76%  PFT 02/05/10 >> FEV1 1.44(60%), FEV1% 51, TLC 5.22(99%), DLCO 78%, +BD  Started xolair 08/14/08 >> stopped 09/10 (pt intolerant of medication) Spirometry 09/12/11 >> FEV1 1.28(60%), FEV1% 55 CT contrast 04/2014 >>centrilobular emphysema  12/04/2014  Chief Complaint  Patient presents with  . Advice Only    Former Dr. Craige CottaSood patient, [facility], worried about congestive cough, awakened at night due to SOB, coughing up thick phlegm that sometimes gets caught in throat.  Patient leaving for [place] first of June and doesn't wanto get stuck out of country being sick.  Patient diagnosed with COPD and Asthma.   CAT score: 13   Has nagging cough since feb 2016 when she had a flare - given prednisone by PCP. Minimal white phlegm, no diurnal variation, no pedal edema or  pND NO increase in wheeze, allergy symptoms are controlled Occasional breakthrough GERD on omeprazole  Spirometry-FEV1 66%, ratio 63  Review of Systems  Constitutional: Negative for fever, chills and unexpected weight change.  HENT: Positive for congestion. Negative for dental problem, ear pain, nosebleeds, postnasal drip, rhinorrhea, sinus pressure, sneezing, sore throat, trouble swallowing and voice change.   Eyes: Negative for visual disturbance.  Respiratory: Positive for cough, shortness of breath and wheezing.   Cardiovascular: Negative for chest  pain and leg swelling.  Gastrointestinal: Negative for vomiting, abdominal pain and diarrhea.  Genitourinary: Negative for difficulty urinating.  Musculoskeletal: Negative for arthralgias.  Skin: Negative for rash.  Neurological: Negative for tremors, syncope and headaches.  Hematological: Does not bruise/bleed easily.       Objective:   Physical Exam  Gen. Pleasant, well-nourished, in no distress, normal affect ENT - no lesions, no post nasal drip Neck: No JVD, no thyromegaly, no carotid bruits Lungs: no use of accessory muscles, no dullness to percussion, clear without rales or rhonchi  Cardiovascular: Rhythm regular, heart sounds  normal, no murmurs or gallops, no peripheral edema Abdomen: soft and non-tender, no hepatosplenomegaly, BS normal. Musculoskeletal: No deformities, no cyanosis or clubbing Neuro:  alert, non focal        Assessment & Plan:

## 2014-12-04 NOTE — Assessment & Plan Note (Signed)
Lung function is stable at 66% -I do not feel that her COPD is worse, no evidence of bronchospasm. Hence we will treat as upper airway cough and for allergies Trial of zyrtec daily x 2 weeks increase omeprazole twice daily  Since she is going out of the country, we will give her a prescription of prednisone to take if she develops wheezing- Prednisone 10 mg tabs  Take 2 tabs daily with food x 5ds, then 1 tab daily with food x 5ds then STOP

## 2014-12-04 NOTE — Patient Instructions (Addendum)
Lung capacity is stable at 66% Trial of zyrtec daily x 2 weeks Stay on omeprazole twice daily Prednisone 10 mg tabs  Take 2 tabs daily with food x 5ds, then 1 tab daily with food x 5ds then STOP

## 2014-12-23 ENCOUNTER — Other Ambulatory Visit: Payer: Self-pay | Admitting: Pulmonary Disease

## 2014-12-25 ENCOUNTER — Telehealth: Payer: Self-pay | Admitting: Pulmonary Disease

## 2014-12-25 NOTE — Telephone Encounter (Signed)
error 

## 2014-12-27 ENCOUNTER — Other Ambulatory Visit: Payer: Self-pay | Admitting: Pulmonary Disease

## 2015-01-30 ENCOUNTER — Telehealth: Payer: Self-pay | Admitting: Pulmonary Disease

## 2015-01-30 MED ORDER — PREDNISONE 10 MG PO TABS: ORAL_TABLET | ORAL | Status: DC

## 2015-01-30 NOTE — Telephone Encounter (Signed)
Pt advised that the best thing she can do at this point is to keep scheduled appt on 02/03/15 at 3:15 with RA as Dr Craige CottaSood does not have an available slot.  Pt aware that we will let Dr Vassie LollAlva know that her care is being transitioned back to [facility] after this appt. Pt was switched to Dr Vassie LollAlva in [place] back in May 2016 - pt called and requested to switch to RA in [place] but a message was never taken to make with switch nor was the okay given by either physician.  Pt states that she was unaware that she was permanently being switched to a new provider, was under the impression that it was just a one time visit.  Apologized for the confusion and inconvenience and advised the patient that we would get everything straightened out and she would go back to seeing Dr Craige CottaSood after her acute visit on 02/03/15  Will send to Dr Vassie LollAlva and Dr Craige CottaSood as Lorain ChildesFYI.

## 2015-01-30 NOTE — Telephone Encounter (Signed)
lmtcb x1 for pt. 

## 2015-01-30 NOTE — Telephone Encounter (Signed)
Made pt an appointment to see RA on 7/5, pt said she really wants to see dr sood though.Caren GriffinsStanley A Dalton

## 2015-01-30 NOTE — Telephone Encounter (Signed)
Pt returned call - [phone]

## 2015-01-30 NOTE — Telephone Encounter (Signed)
Spoke with pt over phone.  She says that she was seen by Dr. Vassie LollAlva in May.  She was having cough, sputum, chest tightness, fatigue, and dyspnea.  She was treated with prednisone.  This helped, but her symptoms recurred after she stopped prednisone.  She is worried about persistent and some progression in her symptoms.  She denies fever.  She has been using her inhalers and singulair.  I have called in prescription for prednisone to take over the weekend.  She has appt scheduled for 7/05 with Dr. Vassie LollAlva.  She would then need f/u with me after this.

## 2015-02-03 ENCOUNTER — Ambulatory Visit (INDEPENDENT_AMBULATORY_CARE_PROVIDER_SITE_OTHER)
Admission: RE | Admit: 2015-02-03 | Discharge: 2015-02-03 | Disposition: A | Discharge status: Home / Self Care | Payer: Medicare Other | Source: Ambulatory Visit | Attending: Pulmonary Disease | Admitting: Pulmonary Disease

## 2015-02-03 ENCOUNTER — Telehealth: Payer: Self-pay | Admitting: Pulmonary Disease

## 2015-02-03 ENCOUNTER — Ambulatory Visit (INDEPENDENT_AMBULATORY_CARE_PROVIDER_SITE_OTHER): Payer: Medicare Other | Admitting: Pulmonary Disease

## 2015-02-03 ENCOUNTER — Encounter: Payer: Self-pay | Admitting: Pulmonary Disease

## 2015-02-03 VITALS — BP 115/70 | HR 80 | Ht 65.0 in | Wt 168.6 lb

## 2015-02-03 DIAGNOSIS — J449 Chronic obstructive pulmonary disease, unspecified: Secondary | ICD-10-CM | POA: Diagnosis not present

## 2015-02-03 NOTE — Assessment & Plan Note (Addendum)
Unclear cause of flare -does not seem to be obvious GERD or postnasal drip or environmental trigger. Lung function was good during last visit which is reassuring CXR today Complete pred taper then extend with prednisone 10 mg  X 1 week Trial of aerospan 2 puffs daily - RINSE mouth after use Use in addition to Symbicort and Spiriva-if this works-may consider changing to a combination of Aerospan and stiolto Call if worse

## 2015-02-03 NOTE — Telephone Encounter (Signed)
Dr Craige CottaSood, can we overbook you in 4 wks or can we schedule pt to see TP

## 2015-02-03 NOTE — Progress Notes (Signed)
   Subjective:    Patient ID: Mary [name], female    DOB: 06/11/1951, 64 y.o.   MRN: [id number]  HPI  64 y.o. female with remote hx of smoking and chronic obstructive asthma, allergic rhinitis, and exercise induced bronchoconstriction.   Her daughter, De HollingsheadJillian Novick (Cell [phone], Work [phone]), works in thoracic oncology department at [employer],         She smoked 1 pack per day, and quit at age 64    02/03/2015  Chief Complaint  Patient presents with  . Acute Visit    VS pt. Patient was given Prednisone for SOB, still on Prednisone.  Wake up in the middle of the night with pressure on chest.  Using inhaler more frequently.  Congestion, coughing up gray mucus. fatigued.     11/2014 She was having cough, sputum, chest tightness, fatigue, and dyspnea -treated with prednisone.She went on a trip to [place], pred helped, but her symptoms recurred after she stopped prednisone. Has nagging cough since feb 2016 when she had a flare - given prednisone then by PCP. Minimal white phlegm, no diurnal variation, no pedal edema or  pND NO increase in wheeze, allergy symptoms are controlled Occasional breakthrough GERD on omeprazole No change in environment  TESTS: RAST 02/28/08 >> IgE 327.6, multiple allergens PFT 02/28/08 >> FEV1 1.44(58%), FEV1% 48, TLC 5.25(99%), DLCO 76%  PFT 02/05/10 >> FEV1 1.44(60%), FEV1% 51, TLC 5.22(99%), DLCO 78%, +BD  Started xolair 08/14/08 >> stopped 09/10 (pt intolerant of medication) Spirometry 09/12/11 >> FEV1 1.28(60%), FEV1% 55 CT angio 04/2014 >>centrilobular emphysema, no PE  Spirometry 11/2014 FEV1 66%  Review of Systems neg for any significant sore throat, dysphagia, itching, sneezing, nasal congestion or excess/ purulent secretions, fever, chills, sweats, unintended wt loss, pleuritic or exertional cp, hempoptysis, orthopnea pnd or change in chronic leg swelling. Also denies presyncope, palpitations, heartburn, abdominal pain,  nausea, vomiting, diarrhea or change in bowel or urinary habits, dysuria,hematuria, rash, arthralgias, visual complaints, headache, numbness weakness or ataxia.     Objective:   Physical Exam  Gen. Pleasant, well-nourished, in no distress, normal affect ENT - no lesions, no post nasal drip Neck: No JVD, no thyromegaly, no carotid bruits Lungs: no use of accessory muscles, no dullness to percussion, no rales, faint scattered rhonchi  Cardiovascular: Rhythm regular, heart sounds  normal, no murmurs or gallops, no peripheral edema Abdomen: soft and non-tender, no hepatosplenomegaly, BS normal. Musculoskeletal: No deformities, no cyanosis or clubbing Neuro:  alert, non focal        Assessment & Plan:

## 2015-02-03 NOTE — Patient Instructions (Signed)
Unclear cause of flare CXR today Complete pred taper then extend with prednisone 10 mg  X 1 week Trial of aerospan 2 puffs daily - RINSE mouth after use Call if worse

## 2015-02-04 ENCOUNTER — Telehealth: Payer: Self-pay | Admitting: Pulmonary Disease

## 2015-02-04 NOTE — Telephone Encounter (Signed)
appt made on 8/5 at 8:45a.  Nothing further needed at this time.

## 2015-02-04 NOTE — Telephone Encounter (Signed)
Can double book visit with me. 

## 2015-02-04 NOTE — Telephone Encounter (Signed)
Result Notes     Notes Recorded by Karleen HampshireMichelle P Gay, CMA on 02/03/2015 at 5:24 PM lmtcb. ------  Notes Recorded by Oretha Milchakesh Alva V, MD on 02/03/2015 at 4:48 PM No fluid or pna or other cause of flare Stable findings of copd   Spoke with pt. She is aware of results. Nothing further was needed at this time.

## 2015-02-04 NOTE — Telephone Encounter (Deleted)
lmtcb for pt.  

## 2015-02-04 NOTE — Telephone Encounter (Signed)
lmomtcb x1 for pt to schedule appt 

## 2015-02-09 ENCOUNTER — Telehealth: Payer: Self-pay | Admitting: Pulmonary Disease

## 2015-02-09 MED ORDER — PREDNISONE 10 MG PO TABS: 10.0000 mg | ORAL_TABLET | Freq: Every day | ORAL | Status: DC

## 2015-02-09 NOTE — Telephone Encounter (Signed)
Pt cb, [phone] or [phone]

## 2015-02-09 NOTE — Telephone Encounter (Signed)
Patient Instructions     Unclear cause of flare CXR today Complete pred taper then extend with prednisone 10 mg X 1 week Trial of aerospan 2 puffs daily - RINSE mouth after use Call if worse   Rx for prednisone has been sent in for 1 more week. Pt is aware. Nothing further is needed.

## 2015-02-09 NOTE — Telephone Encounter (Signed)
lmtcb for pt.  

## 2015-02-26 ENCOUNTER — Ambulatory Visit: Payer: Medicare Other | Admitting: Pulmonary Disease

## 2015-03-06 ENCOUNTER — Encounter: Payer: Self-pay | Admitting: Pulmonary Disease

## 2015-03-06 ENCOUNTER — Encounter (INDEPENDENT_AMBULATORY_CARE_PROVIDER_SITE_OTHER): Payer: Self-pay

## 2015-03-06 ENCOUNTER — Ambulatory Visit (INDEPENDENT_AMBULATORY_CARE_PROVIDER_SITE_OTHER): Payer: Medicare Other | Admitting: Pulmonary Disease

## 2015-03-06 VITALS — BP 142/88 | HR 84 | Ht 65.0 in | Wt 169.2 lb

## 2015-03-06 DIAGNOSIS — J4489 Other specified chronic obstructive pulmonary disease: Secondary | ICD-10-CM

## 2015-03-06 DIAGNOSIS — J432 Centrilobular emphysema: Secondary | ICD-10-CM | POA: Diagnosis not present

## 2015-03-06 DIAGNOSIS — R05 Cough: Secondary | ICD-10-CM | POA: Diagnosis not present

## 2015-03-06 DIAGNOSIS — R058 Other specified cough: Secondary | ICD-10-CM

## 2015-03-06 DIAGNOSIS — J449 Chronic obstructive pulmonary disease, unspecified: Secondary | ICD-10-CM | POA: Diagnosis not present

## 2015-03-06 NOTE — Progress Notes (Signed)
Chief Complaint  Patient presents with  . Follow-up    Pt reports that hre SOB has improved some since last seen by RA 01/2015. Completed Prednisone course.     History of Present Illness: Mary [name] is a 64 y.o. female with remote hx of smoking and chronic obstructive asthma, allergic rhinitis, and exercise induced bronchoconstriction.  She was seen by Dr. Vassie Loll twice over the past few months and treated for allergies and prednisone.  She is slowly improving, but not back to baseline.  She has been seen by Dr. Hanley Hays with [facility] and was told she has a leaky heart valve (?mitral regurgitation).  She gets intermittent burning in her throat.  She has cough with clear sputum.  She gets winded.  She has not been getting wheeze.  She denies leg swelling.  She has been using theo dur for years.   TESTS: RAST 02/28/08 >> IgE 327.6, multiple allergens PFT 02/28/08 >> FEV1 1.44(58%), FEV1% 48, TLC 5.25(99%), DLCO 76%  PFT 02/05/10 >> FEV1 1.44(60%), FEV1% 51, TLC 5.22(99%), DLCO 78%, +BD  Started xolair 08/14/08 >> stopped 09/10 (pt intolerant of medication) Spirometry 09/12/11 >> FEV1 1.28(60%), FEV1% 55 CT chest 04/18/14 >> centrilobular emphysema Spirometry 12/04/14 >> FEV1 1.22 (62%), FEV1% 52  PMHx >> GERD, Esophageal stricture, Osteoporosis, Hypothyroidism  PSHx, Medications, Allergies, Fhx, Shx reviewed.  Physical Exam: BP 142/88 mmHg  Pulse 84  Ht  (1.651 m)  Wt 169 lb 3.2 oz (76.749 kg)  BMI 28.16 kg/m2  SpO2 98%  General - No distress ENT - boggy nasal mucosa, clear to yellow nasal drainage, no sinus tenderness, no oral exudate, no LAN Cardiac - s1s2 regular, no murmur Chest - decreased breath sounds, no wheeze Back - No focal tenderness Abd - Soft, non-tender Ext - No edema Neuro - Normal strength Skin - No rashes Psych - normal mood, and behavior   Assessment/Plan:  COPD with asthma and emphysema. Plan: - will continue spiriva,  symbicort, and singulair - will have her try to stop theophylline >> explained this could be contributing to her reflux - she has significant allergy component >> if her symptoms are not improved, then she might benefit from additional allergy evlauation  Upper airway cough syndrome. Plan: - monitor clinically  Murmur. Plan: - will get records from her cardiologist   Coralyn Helling, MD [place] [facility]/Critical [facility]/Sleep Pager:  [phone] 03/06/2015, 9:14 AM

## 2015-03-06 NOTE — Patient Instructions (Signed)
Try stopping theo-dur Follow up in 3 months

## 2015-04-29 ENCOUNTER — Telehealth: Payer: Self-pay | Admitting: Pulmonary Disease

## 2015-04-29 MED ORDER — OMEPRAZOLE 20 MG PO CPDR: DELAYED_RELEASE_CAPSULE | ORAL | Status: DC

## 2015-04-29 MED ORDER — TIOTROPIUM BROMIDE MONOHYDRATE 18 MCG IN CAPS: ORAL_CAPSULE | RESPIRATORY_TRACT | Status: DC

## 2015-04-29 NOTE — Telephone Encounter (Signed)
Called spoke with pt. Pt is aware form in her chart of what type PNA vaccine she had. Nothing further needed

## 2015-04-29 NOTE — Telephone Encounter (Signed)
lmomtcb x1 

## 2015-04-29 NOTE — Telephone Encounter (Signed)
Spoke with pt. She needed her omeprazole and spiriva sent in. I have done so. Nothing further needed

## 2015-04-29 NOTE — Telephone Encounter (Signed)
Pt cb she at pharm now, [phone]

## 2015-05-16 ENCOUNTER — Other Ambulatory Visit: Payer: Self-pay | Admitting: Pulmonary Disease

## 2015-05-25 ENCOUNTER — Telehealth: Payer: Self-pay | Admitting: Pulmonary Disease

## 2015-05-25 NOTE — Telephone Encounter (Signed)
Noted  

## 2015-05-25 NOTE — Telephone Encounter (Signed)
Called spoke with pt. Offered appt to see TP and did not mind seeing her but she does not feel sick and prefers to wait and see VS in January. Pt scheduled an appt for 08/21/15 with VS. Will make VS aware

## 2015-05-27 ENCOUNTER — Telehealth: Payer: Self-pay | Admitting: Pulmonary Disease

## 2015-05-27 DIAGNOSIS — J449 Chronic obstructive pulmonary disease, unspecified: Secondary | ICD-10-CM

## 2015-05-27 MED ORDER — ALBUTEROL SULFATE HFA 108 (90 BASE) MCG/ACT IN AERS: 2.0000 | INHALATION_SPRAY | Freq: Four times a day (QID) | RESPIRATORY_TRACT | Status: DC | PRN

## 2015-05-27 NOTE — Telephone Encounter (Signed)
Spoke with pt. Needs refill on ProAir. This has been sent in. Nothing further was needed.

## 2015-06-17 ENCOUNTER — Ambulatory Visit (HOSPITAL_COMMUNITY)
Admission: RE | Admit: 2015-06-17 | Discharge: 2015-06-17 | Disposition: A | Discharge status: Home / Self Care | Payer: Medicare Other | Source: Ambulatory Visit | Attending: Obstetrics and Gynecology | Admitting: Obstetrics and Gynecology

## 2015-06-17 DIAGNOSIS — M810 Age-related osteoporosis without current pathological fracture: Secondary | ICD-10-CM | POA: Insufficient documentation

## 2015-06-17 MED ORDER — ZOLEDRONIC ACID 5 MG/100ML IV SOLN
5.0000 mg | Freq: Once | INTRAVENOUS | Status: AC
Start: 2015-06-17 — End: 2015-06-17
  Administered 2015-06-17: 5 mg via INTRAVENOUS

## 2015-06-17 MED ORDER — ZOLEDRONIC ACID 5 MG/100ML IV SOLN
INTRAVENOUS | Status: AC
  Filled 2015-06-17: qty 100

## 2015-06-17 NOTE — Discharge Instructions (Signed)

## 2015-07-07 ENCOUNTER — Other Ambulatory Visit: Payer: Self-pay | Admitting: Pulmonary Disease

## 2015-08-21 ENCOUNTER — Encounter: Payer: Self-pay | Admitting: Pulmonary Disease

## 2015-08-21 ENCOUNTER — Ambulatory Visit (INDEPENDENT_AMBULATORY_CARE_PROVIDER_SITE_OTHER): Payer: Medicare Other | Admitting: Pulmonary Disease

## 2015-08-21 VITALS — BP 112/62 | HR 76 | Ht 65.0 in | Wt 174.4 lb

## 2015-08-21 DIAGNOSIS — J449 Chronic obstructive pulmonary disease, unspecified: Secondary | ICD-10-CM | POA: Diagnosis not present

## 2015-08-21 DIAGNOSIS — J432 Centrilobular emphysema: Secondary | ICD-10-CM | POA: Diagnosis not present

## 2015-08-21 MED ORDER — MONTELUKAST SODIUM 10 MG PO TABS: 10.0000 mg | ORAL_TABLET | Freq: Every day | ORAL | Status: DC

## 2015-08-21 MED ORDER — ALBUTEROL SULFATE HFA 108 (90 BASE) MCG/ACT IN AERS: 2.0000 | INHALATION_SPRAY | Freq: Four times a day (QID) | RESPIRATORY_TRACT | Status: DC | PRN

## 2015-08-21 MED ORDER — BUDESONIDE-FORMOTEROL FUMARATE 160-4.5 MCG/ACT IN AERO: INHALATION_SPRAY | RESPIRATORY_TRACT | Status: DC

## 2015-08-21 MED ORDER — TIOTROPIUM BROMIDE MONOHYDRATE 18 MCG IN CAPS: ORAL_CAPSULE | RESPIRATORY_TRACT | Status: DC

## 2015-08-21 NOTE — Patient Instructions (Signed)
Follow up in 6 months 

## 2015-08-21 NOTE — Progress Notes (Signed)
Current Outpatient Prescriptions on File Prior to Visit  Medication Sig  . co-enzyme Q-10 30 MG capsule Take 30 mg by mouth daily.  Marland Kitchen guaiFENesin (MUCINEX) 600 MG 12 hr tablet Take 1,200 mg by mouth daily.  . Multiple Vitamin (MULTIVITAMIN) capsule Take 1 capsule by mouth daily.    Marland Kitchen omeprazole (PRILOSEC) 20 MG capsule TAKE 1 CAPSULE (20 MG TOTAL) BY MOUTH TWO   (TWO) TIMES DAILY. 30 MINUTES BEFORE THE FIRST AND LAST MEAL OF THE DAY  . raloxifene (EVISTA) 60 MG tablet Take 60 mg by mouth daily.  Marland Kitchen rOPINIRole (REQUIP) 0.5 MG tablet Take 0.5 mg by mouth at bedtime.  . simvastatin (ZOCOR) 10 MG tablet Take 10 mg by mouth daily.   No current facility-administered medications on file prior to visit.     Chief Complaint  Patient presents with  . Follow-up    Pt c/o cough with white/green mucus, wheeze and SOB.      Tests RAST 02/28/08 >> IgE 327.6, multiple allergens PFT 02/28/08 >> FEV1 1.44(58%), FEV1% 48, TLC 5.25(99%), DLCO 76%  PFT 02/05/10 >> FEV1 1.44(60%), FEV1% 51, TLC 5.22(99%), DLCO 78%, +BD  Started xolair 08/14/08 >> stopped 09/10 (pt intolerant of medication) Spirometry 09/12/11 >> FEV1 1.28(60%), FEV1% 55 CT chest 04/18/14 >> centrilobular emphysema Spirometry 12/04/14 >> FEV1 1.22 (62%), FEV1% 52  Past medical hx GERD, Esophageal stricture, Osteoporosis, Hypothyroidism  Past surgical hx, Allergies, Family hx, Social hx all reviewed.  Vital Signs BP 112/62 mmHg  Pulse 76  Ht  (1.651 m)  Wt 174 lb 6.4 oz (79.107 kg)  BMI 29.02 kg/m2  SpO2 97%  History of Present Illness Mary [name] is a 65 y.o. female with remote hx of smoking and chronic obstructive asthma, allergic rhinitis, and exercise induced bronchoconstriction.  Her symptoms are about the same.  She gets cough, wheeze, and dyspnea.  She keeps up with her activity.  She has accepted this as her normal.  She is not interested in trying biologics for asthma, and doesn't feel like she needs  prednisone or antibiotics.  Physical Exam  General - No distress ENT - No sinus tenderness, no oral exudate, no LAN Cardiac - s1s2 regular, no murmur Chest - No wheeze/rales/dullness Back - No focal tenderness Abd - Soft, non-tender Ext - No edema Neuro - Normal strength Skin - No rashes Psych - normal mood, and behavior   Assessment/Plan  COPD with asthma and emphysema. Plan: - will continue spiriva, symbicort, and singulair - prn proair - she has significant allergy component >> if her symptoms are not improved, then she might benefit from additional allergy evlauation >> defer for now   Patient Instructions  Follow up in 6 months     Coralyn Helling, MD [place] [facility]/Critical Care/Sleep Pager:  [phone]

## 2015-10-19 ENCOUNTER — Other Ambulatory Visit: Payer: Self-pay | Admitting: Pulmonary Disease

## 2015-10-20 ENCOUNTER — Telehealth: Payer: Self-pay | Admitting: Pulmonary Disease

## 2015-10-20 NOTE — Telephone Encounter (Signed)
Spoke with pharmacy and verified receipt of omeprazole. Pt notified. No further questions or concerns.

## 2015-12-31 ENCOUNTER — Telehealth: Payer: Self-pay | Admitting: Pulmonary Disease

## 2016-01-01 ENCOUNTER — Telehealth: Payer: Self-pay | Admitting: Internal Medicine

## 2016-01-01 MED ORDER — PREDNISONE 10 MG PO TABS: ORAL_TABLET | ORAL | Status: DC

## 2016-01-01 NOTE — Telephone Encounter (Signed)
Prednisone script that was sent in earlier was for the wrong quantity. This has been fixed. Nothing further was needed.

## 2016-01-01 NOTE — Telephone Encounter (Signed)
[phone] pt calling back

## 2016-01-01 NOTE — Telephone Encounter (Signed)
Spoke with pt. She would rather have the prednisone sent in. Rx has been sent in. Nothing further was needed.

## 2016-01-01 NOTE — Telephone Encounter (Signed)
Spoke with pt, requesting prednisone rx- pt c/o unstable asthma-increased SOB, R sided back discomfort, fatigue, prod cough with clear mucus.  Denies chest pain, fever, nausea, chills.   Pt went to UC last week for same symptoms- was given 3 days of prednisone and amoxicillin with no change in symptoms.   Pt is leaving for vacation tomorrow.  Pt uses [facility] [address]    Sending to DOD as VS is unavailable today.  MW please advise on recs.  Thanks.   Patient Instructions       Follow up in 6 months

## 2016-01-01 NOTE — Telephone Encounter (Signed)
Really needs to be seen by NP but if can't get in by end of day :  Prednisone 10 mg take  4 each am x 2 days,   2 each am x 2 days,  1 each am x 2 days and stop

## 2016-01-01 NOTE — Telephone Encounter (Signed)
lmtcb x1 for pt. 

## 2016-01-11 ENCOUNTER — Other Ambulatory Visit: Payer: Self-pay | Admitting: Obstetrics and Gynecology

## 2016-01-11 DIAGNOSIS — Z1231 Encounter for screening mammogram for malignant neoplasm of breast: Secondary | ICD-10-CM

## 2016-01-27 ENCOUNTER — Other Ambulatory Visit: Payer: Self-pay | Admitting: Obstetrics and Gynecology

## 2016-01-27 ENCOUNTER — Ambulatory Visit
Admission: RE | Admit: 2016-01-27 | Discharge: 2016-01-27 | Disposition: A | Discharge status: Home / Self Care | Payer: Medicare Other | Source: Ambulatory Visit | Attending: Obstetrics and Gynecology | Admitting: Obstetrics and Gynecology

## 2016-01-27 DIAGNOSIS — Z1231 Encounter for screening mammogram for malignant neoplasm of breast: Secondary | ICD-10-CM

## 2016-02-17 ENCOUNTER — Ambulatory Visit (INDEPENDENT_AMBULATORY_CARE_PROVIDER_SITE_OTHER): Payer: Medicare Other | Admitting: Pulmonary Disease

## 2016-02-17 ENCOUNTER — Encounter: Payer: Self-pay | Admitting: Pulmonary Disease

## 2016-02-17 ENCOUNTER — Ambulatory Visit: Payer: Medicare Other | Admitting: Pulmonary Disease

## 2016-02-17 VITALS — BP 118/82 | HR 73 | Ht 64.5 in | Wt 170.2 lb

## 2016-02-17 DIAGNOSIS — J432 Centrilobular emphysema: Secondary | ICD-10-CM

## 2016-02-17 DIAGNOSIS — J449 Chronic obstructive pulmonary disease, unspecified: Secondary | ICD-10-CM

## 2016-02-17 DIAGNOSIS — J45909 Unspecified asthma, uncomplicated: Secondary | ICD-10-CM | POA: Diagnosis not present

## 2016-02-17 MED ORDER — ROFLUMILAST 500 MCG PO TABS: 500.0000 ug | ORAL_TABLET | Freq: Every day | ORAL | Status: DC

## 2016-02-17 NOTE — Patient Instructions (Signed)
Daliresp 1 pill daily  If breathing improved after using daliresp for 3 weeks, then try stopping theophylline  Follow up in 2 months

## 2016-02-17 NOTE — Progress Notes (Signed)
Current Outpatient Prescriptions on File Prior to Visit  Medication Sig  . albuterol (PROAIR HFA) 108 (90 Base) MCG/ACT inhaler Inhale 2 puffs into the lungs every 6 (six) hours as needed.  . budesonide-formoterol (SYMBICORT) 160-4.5 MCG/ACT inhaler Inhale 2 puffs into the  lungs two times daily  . co-enzyme Q-10 30 MG capsule Take 30 mg by mouth daily.  . fluticasone (FLONASE) 50 MCG/ACT nasal spray Place 2 sprays into both nostrils daily.  Marland Kitchen. guaiFENesin (MUCINEX) 600 MG 12 hr tablet Take 1,200 mg by mouth daily.  . Lactobacillus Rhamnosus, GG, (CULTURELLE PO) Take by mouth.  . levothyroxine (SYNTHROID, LEVOTHROID) 75 MCG tablet Take 1 tablet by mouth daily.  . Magnesium 100 MG CAPS Take by mouth.  . montelukast (SINGULAIR) 10 MG tablet Take 1 tablet (10 mg total) by mouth at bedtime.  . Multiple Vitamin (MULTIVITAMIN) capsule Take 1 capsule by mouth daily.    Marland Kitchen. omeprazole (PRILOSEC) 20 MG capsule TAKE 1 CAPSULE BY MOUTH TWICE DAILY 30 MINUTES BEFORE FIRST AND LAST MEAL OF DAY  . predniSONE (DELTASONE) 10 MG tablet 4 each am x 2 days,   2 each am x 2 days,  1 each am x 2 days and stop  . raloxifene (EVISTA) 60 MG tablet Take 60 mg by mouth daily.  Marland Kitchen. rOPINIRole (REQUIP) 0.5 MG tablet Take 0.5 mg by mouth at bedtime.  . simvastatin (ZOCOR) 10 MG tablet Take 10 mg by mouth daily.  Marland Kitchen. tiotropium (SPIRIVA HANDIHALER) 18 MCG inhalation capsule INHALE THE CONTENTS OF 1 CAPSULE VIA INHALATION DEVICE DAILY   No current facility-administered medications on file prior to visit.     Chief Complaint  Patient presents with  . Follow-up    Pt reports having recent asthma flare - breathing has returned back to baseline since. Denies any current breathing issues. Still on Theo-24 - needs alternative     Pulmonary tests RAST 02/28/08 >> IgE 327.6, multiple allergens PFT 02/28/08 >> FEV1 1.44(58%), FEV1% 48, TLC 5.25(99%), DLCO 76%  PFT 02/05/10 >> FEV1 1.44(60%), FEV1% 51, TLC 5.22(99%), DLCO 78%, +BD   Started xolair 08/14/08 >> stopped 09/10 (pt intolerant of medication) Spirometry 09/12/11 >> FEV1 1.28(60%), FEV1% 55 CT chest 04/18/14 >> centrilobular emphysema Spirometry 12/04/14 >> FEV1 1.22 (62%), FEV1% 52  Past medical hx GERD, Esophageal stricture, Osteoporosis, Hypothyroidism  Past surgical hx, Allergies, Family hx, Social hx all reviewed.  Vital Signs BP 118/82 mmHg  Pulse 73  Ht 5' 4.5" (1.638 m)  Wt 170 lb 3.2 oz (77.202 kg)  BMI 28.77 kg/m2  SpO2 98%  History of Present Illness Mary [name] is a 65 y.o. female with remote hx of smoking and chronic obstructive asthma, allergic rhinitis, and exercise induced bronchoconstriction.  She had an asthma flare in June >> tx with prednisone.  She has been on several courses of prednisone and antibiotics from her PCP over the past 6 months.  Her breathing is back to baseline.  She still has cough and chest congestion with sputum production, but not enough to interfere with her activities at present.  She has trouble in heat, but will then come back inside.  She denies sinus congestion, sore throat, or fever.  She wants to know if there is alternative to theophylline >> she feels it helps, but very difficult to get filled.   Physical Exam  General - No distress ENT - No sinus tenderness, no oral exudate, no LAN Cardiac - s1s2 regular, no murmur Chest - No wheeze/rales/dullness  Back - No focal tenderness Abd - Soft, non-tender Ext - No edema Neuro - Normal strength Skin - No rashes Psych - normal mood, and behavior   Assessment/Plan  COPD with chronic bronchitis. COPD with emphysema, asthma. - she has frequent exacerbations requiring prednisone and antibiotics - will have her try daliresp - continue spiriva, symbicort, and singulair - if she tolerates daliresp and breathing improves, then will have her try stopping theophylline - if her symptoms persist, then will consider trying newer biologic agent for asthma  (ie nucala)   Patient Instructions  Daliresp 1 pill daily  If breathing improved after using daliresp for 3 weeks, then try stopping theophylline  Follow up in 2 months     Coralyn Helling, MD [place] [facility]/Critical Care/Sleep Pager:  [phone] 02/17/2016, 10:17 AM

## 2016-02-25 ENCOUNTER — Telehealth: Payer: Self-pay | Admitting: Pulmonary Disease

## 2016-02-25 NOTE — Telephone Encounter (Signed)
Daliresp is not covered by [employer]. [facility] faxed [place] the PA form that should be filled out. Form has been filled out, faxed back and placed in the blue accordion file in Triage. Will route to [place] to follow up on.

## 2016-02-29 NOTE — Telephone Encounter (Signed)
Approved on 7.27.17, end date 12.31.17 Authorization # [id number]  [facility] and spoke with Angie to make sure they are aware of the approval Nothing further needed; will sign off

## 2016-04-27 ENCOUNTER — Encounter: Payer: Self-pay | Admitting: Pulmonary Disease

## 2016-04-27 ENCOUNTER — Ambulatory Visit (INDEPENDENT_AMBULATORY_CARE_PROVIDER_SITE_OTHER): Payer: Medicare Other | Admitting: Pulmonary Disease

## 2016-04-27 VITALS — BP 108/78 | HR 88 | Ht 64.5 in | Wt 170.8 lb

## 2016-04-27 DIAGNOSIS — J449 Chronic obstructive pulmonary disease, unspecified: Secondary | ICD-10-CM | POA: Diagnosis not present

## 2016-04-27 DIAGNOSIS — J45909 Unspecified asthma, uncomplicated: Secondary | ICD-10-CM | POA: Diagnosis not present

## 2016-04-27 DIAGNOSIS — J432 Centrilobular emphysema: Secondary | ICD-10-CM | POA: Diagnosis not present

## 2016-04-27 NOTE — Patient Instructions (Signed)
Follow up in 1 year.

## 2016-04-27 NOTE — Progress Notes (Signed)
Current Outpatient Prescriptions on File Prior to Visit  Medication Sig  . albuterol (PROAIR HFA) 108 (90 Base) MCG/ACT inhaler Inhale 2 puffs into the lungs every 6 (six) hours as needed.  . budesonide-formoterol (SYMBICORT) 160-4.5 MCG/ACT inhaler Inhale 2 puffs into the  lungs two times daily  . co-enzyme Q-10 30 MG capsule Take 30 mg by mouth daily.  . fluticasone (FLONASE) 50 MCG/ACT nasal spray Place 2 sprays into both nostrils daily.  Marland Kitchen guaiFENesin (MUCINEX) 600 MG 12 hr tablet Take 1,200 mg by mouth daily.  . Lactobacillus Rhamnosus, GG, (CULTURELLE PO) Take by mouth.  . levothyroxine (SYNTHROID, LEVOTHROID) 75 MCG tablet Take 1 tablet by mouth daily.  . Magnesium 100 MG CAPS Take by mouth.  . montelukast (SINGULAIR) 10 MG tablet Take 1 tablet (10 mg total) by mouth at bedtime.  . Multiple Vitamin (MULTIVITAMIN) capsule Take 1 capsule by mouth daily.    Marland Kitchen omeprazole (PRILOSEC) 20 MG capsule TAKE 1 CAPSULE BY MOUTH TWICE DAILY 30 MINUTES BEFORE FIRST AND LAST MEAL OF DAY  . raloxifene (EVISTA) 60 MG tablet Take 60 mg by mouth daily.  Marland Kitchen rOPINIRole (REQUIP) 0.5 MG tablet Take 0.5 mg by mouth at bedtime.  . simvastatin (ZOCOR) 10 MG tablet Take 10 mg by mouth daily.  Marland Kitchen THEO-24 100 MG 24 hr capsule Take 100 mg by mouth daily.  Marland Kitchen tiotropium (SPIRIVA HANDIHALER) 18 MCG inhalation capsule INHALE THE CONTENTS OF 1 CAPSULE VIA INHALATION DEVICE DAILY   No current facility-administered medications on file prior to visit.      Chief Complaint  Patient presents with  . Follow-up    Pt. states that she did not feel any difference on the Daliresp,Pt. c/o of a cough, no sob, no chest pain or chest tightness     Pulmonary tests RAST 02/28/08 >> IgE 327.6, multiple allergens PFT 02/28/08 >> FEV1 1.44(58%), FEV1% 48, TLC 5.25(99%), DLCO 76%  PFT 02/05/10 >> FEV1 1.44(60%), FEV1% 51, TLC 5.22(99%), DLCO 78%, +BD  Started xolair 08/14/08 >> stopped 09/10 (pt intolerant of  medication) Spirometry 09/12/11 >> FEV1 1.28(60%), FEV1% 55 CT chest 04/18/14 >> centrilobular emphysema Spirometry 12/04/14 >> FEV1 1.22 (62%), FEV1% 52  Past medical hx GERD, Esophageal stricture, Osteoporosis, Hypothyroidism  Past surgical hx, Allergies, Family hx, Social hx all reviewed.  Vital Signs BP 108/78 (BP Location: Left Arm, Patient Position: Sitting, Cuff Size: Normal)   Pulse 88   Ht 5' 4.5" (1.638 m)   Wt 170 lb 12.8 oz (77.5 kg)   SpO2 98%   BMI 28.86 kg/m   History of Present Illness Mary [name] is a 65 y.o. female with remote hx of smoking and chronic obstructive asthma, allergic rhinitis, and exercise induced bronchoconstriction.  She didn't feel that daliresp helped.  She resumed theophylline, and this seems to help more with her breathing.  She gets intermittent episodes of cough and chest congestion.  This is at baseline.  She is not having fever, hemoptysis, sinus pressure, chest pain, swelling, or skin rash.  She got her flu shot for this season.  Physical Exam  General - No distress ENT - No sinus tenderness, no oral exudate, no LAN Cardiac - s1s2 regular, no murmur Chest - No wheeze/rales/dullness Back - No focal tenderness Abd - Soft, non-tender Ext - No edema Neuro - Normal strength Skin - No rashes Psych - normal mood, and behavior   Assessment/Plan  COPD with chronic bronchitis, emphysema, asthma. - no benefit from daliresp - continue spiriva,  symbicort, singulair, and theophylline - previously on xolair, but had adverse reaction >> she is reluctant to consider other biologic agents   Patient Instructions  Follow up in 1 year   Mary HellingVineet Irish Piech, MD [place] [facility]/Critical Care/Sleep Pager:  [phone] 04/27/2016, 3:16 PM

## 2016-05-11 ENCOUNTER — Other Ambulatory Visit: Payer: Self-pay | Admitting: Pulmonary Disease

## 2016-05-20 DIAGNOSIS — M65972 Unspecified synovitis and tenosynovitis, left ankle and foot: Secondary | ICD-10-CM | POA: Insufficient documentation

## 2016-06-10 ENCOUNTER — Other Ambulatory Visit: Payer: Self-pay | Admitting: Pulmonary Disease

## 2016-06-10 NOTE — Telephone Encounter (Signed)
Dr Craige CottaSood please advise if you wish to keep refilling the Omeprazole for this patient or if she needs to get it from her PCP. Thanks.

## 2016-07-20 ENCOUNTER — Other Ambulatory Visit (HOSPITAL_COMMUNITY): Payer: Self-pay | Admitting: *Deleted

## 2016-07-21 ENCOUNTER — Ambulatory Visit (HOSPITAL_COMMUNITY)
Admission: RE | Admit: 2016-07-21 | Discharge: 2016-07-21 | Disposition: A | Discharge status: Home / Self Care | Payer: Medicare Other | Source: Ambulatory Visit | Attending: Obstetrics and Gynecology | Admitting: Obstetrics and Gynecology

## 2016-07-21 DIAGNOSIS — M810 Age-related osteoporosis without current pathological fracture: Secondary | ICD-10-CM | POA: Diagnosis present

## 2016-07-21 MED ORDER — ZOLEDRONIC ACID 5 MG/100ML IV SOLN
5.0000 mg | Freq: Once | INTRAVENOUS | Status: DC
Start: 2016-07-21 — End: 2016-07-22

## 2016-07-21 MED ORDER — ZOLEDRONIC ACID 5 MG/100ML IV SOLN
INTRAVENOUS | Status: AC
  Administered 2016-07-21: 5 mg
  Filled 2016-07-21: qty 100

## 2016-08-20 ENCOUNTER — Other Ambulatory Visit: Payer: Self-pay | Admitting: Pulmonary Disease

## 2016-09-06 ENCOUNTER — Other Ambulatory Visit: Payer: Self-pay | Admitting: Pulmonary Disease

## 2016-09-09 ENCOUNTER — Other Ambulatory Visit: Payer: Self-pay | Admitting: Pulmonary Disease

## 2016-09-11 NOTE — Telephone Encounter (Signed)
Needed Symbicort refill- called [facility]

## 2016-09-26 ENCOUNTER — Telehealth: Payer: Self-pay | Admitting: Pulmonary Disease

## 2016-09-26 MED ORDER — THEO-24 100 MG PO CP24: 100.0000 mg | ORAL_CAPSULE | Freq: Every day | ORAL | 3 refills | Status: DC

## 2016-09-26 NOTE — Telephone Encounter (Signed)
lmtcb X1 for pt Need to verify what rx's need refilled.

## 2016-09-26 NOTE — Telephone Encounter (Signed)
Pt requesting refill on theo-24.  This has been sent to preferred pharmacy.  Nothing further needed.

## 2016-10-17 ENCOUNTER — Other Ambulatory Visit: Payer: Self-pay | Admitting: Pulmonary Disease

## 2016-10-20 NOTE — Telephone Encounter (Signed)
Dr Craige CottaSood please advise if you wish to continue refilling Omeprazole 20mg  for this patient or if this needs to be deferred to her PCP. It looks to have been filled about every 3-6 months over the last 5 years or so but I do not see any documentation in your notes about continuing this medication.  Please advise. Thanks.

## 2016-10-21 NOTE — Telephone Encounter (Signed)
I can refill for now, and discuss with her at next visit.

## 2016-11-14 ENCOUNTER — Other Ambulatory Visit: Payer: Self-pay | Admitting: Pulmonary Disease

## 2016-11-23 ENCOUNTER — Telehealth: Payer: Self-pay | Admitting: Pulmonary Disease

## 2016-11-23 MED ORDER — OMEPRAZOLE 20 MG PO CPDR: DELAYED_RELEASE_CAPSULE | ORAL | 1 refills | Status: DC

## 2016-11-23 NOTE — Telephone Encounter (Signed)
Called and spoke to pt. Pt is requesting Prilosec be sent to local pharmacy. Rx was already sent in on 4.17.18, pt states [facility] hasn't received anything. New rx sent to pharmacy. Pt verbalized understanding and denied any further questions or concerns at this time.

## 2016-12-01 ENCOUNTER — Encounter: Payer: Self-pay | Admitting: Pulmonary Disease

## 2016-12-01 ENCOUNTER — Ambulatory Visit (INDEPENDENT_AMBULATORY_CARE_PROVIDER_SITE_OTHER): Payer: Medicare Other | Admitting: Pulmonary Disease

## 2016-12-01 VITALS — BP 102/78 | HR 81 | Ht 65.0 in | Wt 173.6 lb

## 2016-12-01 DIAGNOSIS — J4551 Severe persistent asthma with (acute) exacerbation: Secondary | ICD-10-CM | POA: Diagnosis not present

## 2016-12-01 DIAGNOSIS — J449 Chronic obstructive pulmonary disease, unspecified: Secondary | ICD-10-CM

## 2016-12-01 DIAGNOSIS — J441 Chronic obstructive pulmonary disease with (acute) exacerbation: Secondary | ICD-10-CM

## 2016-12-01 MED ORDER — PREDNISONE 10 MG PO TABS: ORAL_TABLET | ORAL | 0 refills | Status: DC

## 2016-12-01 MED ORDER — ALBUTEROL SULFATE (2.5 MG/3ML) 0.083% IN NEBU: 2.5000 mg | INHALATION_SOLUTION | Freq: Four times a day (QID) | RESPIRATORY_TRACT | 5 refills | Status: DC | PRN

## 2016-12-01 NOTE — Progress Notes (Signed)
Current Outpatient Prescriptions on File Prior to Visit  Medication Sig  . albuterol (PROAIR HFA) 108 (90 Base) MCG/ACT inhaler Inhale 2 puffs into the lungs every 6 (six) hours as needed.  Marland Kitchen co-enzyme Q-10 30 MG capsule Take 30 mg by mouth daily.  . fluticasone (FLONASE) 50 MCG/ACT nasal spray Place 2 sprays into both nostrils daily.  Marland Kitchen guaiFENesin (MUCINEX) 600 MG 12 hr tablet Take 1,200 mg by mouth 2 (two) times daily.   . Lactobacillus Rhamnosus, GG, (CULTURELLE PO) Take by mouth.  . levothyroxine (SYNTHROID, LEVOTHROID) 75 MCG tablet Take 1 tablet by mouth daily.  . Magnesium 100 MG CAPS Take by mouth.  . montelukast (SINGULAIR) 10 MG tablet TAKE 1 TABLET BY MOUTH EVERY NIGHT AT BEDTIME  . omeprazole (PRILOSEC) 20 MG capsule TAKE 1 TABLET BY MOUTH TWICE DAILY 30 MINUTES PRIOR TO FIRST AND LAST MEAL OF THE DAY  . raloxifene (EVISTA) 60 MG tablet Take 60 mg by mouth daily.  . simvastatin (ZOCOR) 10 MG tablet Take 10 mg by mouth daily.  Marland Kitchen SPIRIVA HANDIHALER 18 MCG inhalation capsule INHALE 1 CAPSULE VIA INHALER EVERY DAY  . SYMBICORT 160-4.5 MCG/ACT inhaler INHALE 2 PUFFS INTO LUNGS TWICE DAILY  . THEO-24 100 MG 24 hr capsule Take 1 capsule (100 mg total) by mouth daily.  Marland Kitchen rOPINIRole (REQUIP) 0.5 MG tablet Take 0.5 mg by mouth at bedtime.   No current facility-administered medications on file prior to visit.      Chief Complaint  Patient presents with  . Acute Visit    Pt states that she feels she is having an asthma flare x 1 month. Pt notes a congested cough and increased SOB. Seen by urgent care 11/26/16 and as given a Prednisone taper and depo injection -- Pt states that she feels this slowed the progression of the flare. Discuss getting neb meds.     Pulmonary tests RAST 02/28/08 >> IgE 327.6, multiple allergens PFT 02/28/08 >> FEV1 1.44(58%), FEV1% 48, TLC 5.25(99%), DLCO 76%  PFT 02/05/10 >> FEV1 1.44(60%), FEV1% 51, TLC 5.22(99%), DLCO 78%, +BD  Started xolair 08/14/08 >>  stopped 09/10 (pt intolerant of medication) Spirometry 09/12/11 >> FEV1 1.28(60%), FEV1% 55 CT chest 04/18/14 >> centrilobular emphysema Spirometry 12/04/14 >> FEV1 1.22 (62%), FEV1% 52  Past medical history GERD, Esophageal stricture, Osteoporosis, Hypothyroidism  Past surgical history, Family history, Social history, Allergies reviewed  Vital Signs BP 102/78 (BP Location: Right Arm, Cuff Size: Normal)   Pulse 81   Ht 5\' 5"  (1.651 m)   Wt 173 lb 9.6 oz (78.7 kg)   SpO2 98%   BMI 28.89 kg/m   History of Present Illness Mary [name] is a 66 y.o. female with remote hx of smoking and chronic obstructive asthma, allergic rhinitis, and exercise induced bronchoconstriction.  She started noticing trouble about a month ago.  She is getting cough, wheeze, and chest congestion.  She is not having fever.  Denies sinus congestion, sore throat, headache, or skin rash.  She denies sick exposures.  She was seen in urgent care and given prednisone.  Last dose is today.  Physical Exam  General - pleasant Eyes - pupils reactive ENT - no sinus tenderness, no oral exudate, no LAN Cardiac - regular, no murmur Chest - b/l wheeze that partially clears with coughing Abd - soft, non tender Ext - no edema Skin - no rashes Neuro - normal strength Psych - normal mood  Discussion She has COPD from allergic asthma, and prior  tobacco use with emphysema.  She has persistent symptoms.  She was previously on xolair, but had difficulty tolerating this.  Her symptoms seem to have progressed, and she is now willing to consider alternative biologic agent for allergic asthma.  She was tried on daliresp before, and this didn't help.  Assessment/Plan  COPD with chronic bronchitis, emphysema, asthma. - complete course of prednisone - continue spiriva, symbicort, singulair - she has not been able to tolerate transitioning off theophylline - she is willing to reconsider biologics >> will reassess at next  visit   Patient Instructions  Starting taking an over the counter antihistamine pill  Call if you are going to restart prednisone  Follow up in 6 weeks with Dr. Craige CottaSood or Nurse Practitioner  Time spent 28 minutes  Mary HellingVineet Lory Nowaczyk, MD  Pulmonary/Critical Care/Sleep Pager:  [phone] 12/01/2016, 9:52 AM

## 2016-12-01 NOTE — Patient Instructions (Signed)
Starting taking an over the counter antihistamine pill  Call if you are going to restart prednisone  Follow up in 6 weeks with Dr. Craige CottaSood or Nurse Practitioner

## 2016-12-19 ENCOUNTER — Other Ambulatory Visit: Payer: Self-pay | Admitting: Family Medicine

## 2016-12-19 ENCOUNTER — Other Ambulatory Visit: Payer: Self-pay | Admitting: Obstetrics and Gynecology

## 2016-12-19 DIAGNOSIS — Z1231 Encounter for screening mammogram for malignant neoplasm of breast: Secondary | ICD-10-CM

## 2017-01-13 ENCOUNTER — Ambulatory Visit: Payer: Medicare Other | Admitting: Adult Health

## 2017-02-03 ENCOUNTER — Ambulatory Visit (INDEPENDENT_AMBULATORY_CARE_PROVIDER_SITE_OTHER)
Admission: RE | Admit: 2017-02-03 | Discharge: 2017-02-03 | Disposition: A | Discharge status: Home / Self Care | Payer: Medicare Other | Source: Ambulatory Visit | Attending: Adult Health | Admitting: Adult Health

## 2017-02-03 ENCOUNTER — Encounter: Payer: Self-pay | Admitting: Adult Health

## 2017-02-03 ENCOUNTER — Other Ambulatory Visit (INDEPENDENT_AMBULATORY_CARE_PROVIDER_SITE_OTHER): Payer: Medicare Other

## 2017-02-03 ENCOUNTER — Ambulatory Visit (INDEPENDENT_AMBULATORY_CARE_PROVIDER_SITE_OTHER): Payer: Medicare Other | Admitting: Adult Health

## 2017-02-03 VITALS — BP 122/74 | HR 66 | Ht 64.0 in | Wt 180.6 lb

## 2017-02-03 DIAGNOSIS — R05 Cough: Secondary | ICD-10-CM

## 2017-02-03 DIAGNOSIS — R059 Cough, unspecified: Secondary | ICD-10-CM

## 2017-02-03 DIAGNOSIS — J3089 Other allergic rhinitis: Secondary | ICD-10-CM

## 2017-02-03 DIAGNOSIS — J449 Chronic obstructive pulmonary disease, unspecified: Secondary | ICD-10-CM

## 2017-02-03 LAB — CBC WITH DIFFERENTIAL/PLATELET
BASOS PCT: 0.6 % (ref 0.0–3.0)
Basophils Absolute: 0.1 10*3/uL (ref 0.0–0.1)
EOS ABS: 0.6 10*3/uL (ref 0.0–0.7)
Eosinophils Relative: 5.5 % — ABNORMAL HIGH (ref 0.0–5.0)
HEMATOCRIT: 41.7 % (ref 36.0–46.0)
HEMOGLOBIN: 14 g/dL (ref 12.0–15.0)
LYMPHS PCT: 39.9 % (ref 12.0–46.0)
Lymphs Abs: 4.4 10*3/uL — ABNORMAL HIGH (ref 0.7–4.0)
MCHC: 33.6 g/dL (ref 30.0–36.0)
MCV: 94.4 fl (ref 78.0–100.0)
MONOS PCT: 9.2 % (ref 3.0–12.0)
Monocytes Absolute: 1 10*3/uL (ref 0.1–1.0)
NEUTROS ABS: 4.9 10*3/uL (ref 1.4–7.7)
Neutrophils Relative %: 44.8 % (ref 43.0–77.0)
PLATELETS: 304 10*3/uL (ref 150.0–400.0)
RBC: 4.41 Mil/uL (ref 3.87–5.11)
RDW: 13.8 % (ref 11.5–15.5)
WBC: 10.9 10*3/uL — AB (ref 4.0–10.5)

## 2017-02-03 LAB — BASIC METABOLIC PANEL
BUN: 14 mg/dL (ref 6–23)
CO2: 32 meq/L (ref 19–32)
Calcium: 10 mg/dL (ref 8.4–10.5)
Chloride: 101 mEq/L (ref 96–112)
Creatinine, Ser: 0.98 mg/dL (ref 0.40–1.20)
GFR: 72.53 mL/min (ref >60.00)
GLUCOSE: 104 mg/dL — AB (ref 70–99)
POTASSIUM: 4.2 meq/L (ref 3.5–5.1)
SODIUM: 138 meq/L (ref 135–145)

## 2017-02-03 LAB — BRAIN NATRIURETIC PEPTIDE: Pro B Natriuretic peptide (BNP): 16 pg/mL (ref 0.0–100.0)

## 2017-02-03 NOTE — Progress Notes (Signed)
@Patient  ID: Mary [name], female    DOB: 30-Mar-1951, 66 y.o.   MRN: [id number]  Chief Complaint  Patient presents with  . Follow-up    COPD     Referring provider: Caffie Damme, MD  HPI: 66 yo female former smoker followed for chronic obstructive asthma , AR , COPD /Emphysema   TEST  Pulmonary tests RAST 02/28/08 >> IgE 327.6, multiple allergens PFT 02/28/08 >> FEV1 1.44(58%), FEV1% 48, TLC 5.25(99%), DLCO 76%  PFT 02/05/10 >> FEV1 1.44(60%), FEV1% 51, TLC 5.22(99%), DLCO 78%, +BD  Started xolair 08/14/08 >> stopped 09/10 (pt intolerant of medication) Spirometry 09/12/11 >> FEV1 1.28(60%), FEV1% 55 CT chest 04/18/14 >> centrilobular emphysema Spirometry 12/04/14 >> FEV1 1.22 (62%), FEV1% 52  02/03/2017 Follow up : COPD /Asthma  Pt returns for 2 month follow up for recent asthma flare over last 2 months . She has been treated with steroids x 2 . Feels like it helped but did not clear up sx totally. Still has some intermittent dry cough , drainage , wheezing . She remains on Symbicort, Singulair, Spiriva and Zyrtec .  Spirometry today shows  She denies any hemoptysis, chest pain, orthopnea, PND, leg swelling, abdominal pain, nausea, vomiting, or unintentional weight loss   No Known Allergies  Immunization History  Administered Date(s) Administered  . Influenza Split 04/02/2011, 04/01/2015  . Influenza Whole 04/01/1997, 05/01/2012, 03/15/2014  . Influenza,inj,Quad PF,36+ Mos 03/07/2016  . Pneumococcal Conjugate-13 06/03/2015  . Pneumococcal Polysaccharide-23 05/04/2007  . Td 04/01/1997    Past Medical History:  Diagnosis Date  . Allergic rhinitis   . Chronic obstructive asthma   . Colon polyp   . Esophageal stricture   . Exercise-induced bronchoconstriction   . GERD (gastroesophageal reflux disease)   . Hypothyroidism   . Osteoporosis   . Pneumonia     Tobacco History: History  Smoking Status  . Former Smoker  . Packs/day: 1.00  . Years: 10.00  . Types:  Cigarettes  . Quit date: 08/02/1979  Smokeless Tobacco  . Never Used   Counseling given: Not Answered   Outpatient Encounter Prescriptions as of 02/03/2017  Medication Sig  . albuterol (PROAIR HFA) 108 (90 Base) MCG/ACT inhaler Inhale 2 puffs into the lungs every 6 (six) hours as needed.  Marland Kitchen albuterol (PROVENTIL) (2.5 MG/3ML) 0.083% nebulizer solution Take 3 mLs (2.5 mg total) by nebulization every 6 (six) hours as needed for wheezing or shortness of breath.  . fluticasone (FLONASE) 50 MCG/ACT nasal spray Place 2 sprays into both nostrils daily.  Marland Kitchen guaiFENesin (MUCINEX) 600 MG 12 hr tablet Take 1,200 mg by mouth 2 (two) times daily.   . Lactobacillus Rhamnosus, GG, (CULTURELLE PO) Take by mouth.  . levothyroxine (SYNTHROID, LEVOTHROID) 75 MCG tablet Take 1 tablet by mouth daily.  . Magnesium 100 MG CAPS Take by mouth.  . montelukast (SINGULAIR) 10 MG tablet TAKE 1 TABLET BY MOUTH EVERY NIGHT AT BEDTIME  . omeprazole (PRILOSEC) 20 MG capsule TAKE 1 TABLET BY MOUTH TWICE DAILY 30 MINUTES PRIOR TO FIRST AND LAST MEAL OF THE DAY  . predniSONE (DELTASONE) 10 MG tablet 4 pills for 2 days, 3 pills for 2 days, 2 pills for 2 days, 1 pill for 2 days  . raloxifene (EVISTA) 60 MG tablet Take 60 mg by mouth daily.  Marland Kitchen rOPINIRole (REQUIP) 0.5 MG tablet Take 0.5 mg by mouth at bedtime.  . simvastatin (ZOCOR) 10 MG tablet Take 10 mg by mouth daily.  Marland Kitchen SPIRIVA HANDIHALER 18 MCG  inhalation capsule INHALE 1 CAPSULE VIA INHALER EVERY DAY  . SYMBICORT 160-4.5 MCG/ACT inhaler INHALE 2 PUFFS INTO LUNGS TWICE DAILY  . THEO-24 100 MG 24 hr capsule Take 1 capsule (100 mg total) by mouth daily.  Marland Kitchen. co-enzyme Q-10 30 MG capsule Take 30 mg by mouth daily.   No facility-administered encounter medications on file as of 02/03/2017.      Review of Systems  Constitutional:   No  weight loss, night sweats,  Fevers, chills, + fatigue, or  lassitude.  HEENT:   No headaches,  Difficulty swallowing,  Tooth/dental problems, or   Sore throat,                No sneezing, itching, ear ache,  +nasal congestion, post nasal drip,   CV:  No chest pain,  Orthopnea, PND, swelling in lower extremities, anasarca, dizziness, palpitations, syncope.   GI  No heartburn, indigestion, abdominal pain, nausea, vomiting, diarrhea, change in bowel habits, loss of appetite, bloody stools.   Resp: No chest wall deformity  Skin: no rash or lesions.  GU: no dysuria, change in color of urine, no urgency or frequency.  No flank pain, no hematuria   MS:  No joint pain or swelling.  No decreased range of motion.  No back pain.    Physical Exam  BP 122/74 (BP Location: Left Arm, Cuff Size: Normal)   Pulse 66   Ht 5\' 4"  (1.626 m)   Wt 180 lb 9.6 oz (81.9 kg)   SpO2 99%   BMI 31.00 kg/m   GEN: A/Ox3; pleasant , NAD, obese    HEENT:  [place]/AT,  EACs-clear, TMs-wnl, NOSE-clear, THROAT-clear, no lesions, no postnasal drip or exudate noted.   NECK:  Supple w/ fair ROM; no JVD; normal carotid impulses w/o bruits; no thyromegaly or nodules palpated; no lymphadenopathy.  No stridor   RESP  Faint exp wheeze on forced expiration (psuedowheeze) ,  no accessory muscle use, no dullness to percussion. Speaks in full sentences   CARD:  RRR, no m/r/g, no peripheral edema, pulses intact, no cyanosis or clubbing.  GI:   Soft & nt; nml bowel sounds; no organomegaly or masses detected.   Musco: Warm bil, no deformities or joint swelling noted.   Neuro: alert, no focal deficits noted.    Skin: Warm, no lesions or rashes    Lab Results:  CBC No results found for: WBC, RBC, HGB, HCT, PLT, MCV, MCH, MCHC, RDW, LYMPHSABS, MONOABS, EOSABS, BASOSABS  BMET  BNP No results found for: BNP  ProBNP No results found for: PROBNP  Imaging: No results found.   Assessment & Plan:   COPD with asthma Slow to resolve flare . Spirometry with Moderate CODP similar to 2016.  Check labs with cbc w/ diff . Allergy profile/IgE , BNP , bmet  Check cxr    Plan  Patient Instructions  Chest xray and labs today .  Continue on Symbicort and Spiriva  Continue on Zyrtec 10mg  At bedtime   Mucinex DM Twice daily  As needed  Cough/congestion .  follow up with Dr. Craige CottaSood  In 3 months and As needed   Please contact office for sooner follow up if symptoms do not improve or worsen or seek emergency care         Allergic rhinitis Flare   Plan  Patient Instructions  Chest xray and labs today .  Continue on Symbicort and Spiriva  Continue on Zyrtec 10mg  At bedtime   Mucinex DM Twice  daily  As needed  Cough/congestion .  follow up with Dr. Craige Cotta  In 3 months and As needed   Please contact office for sooner follow up if symptoms do not improve or worsen or seek emergency care          Rubye Oaks, NP 02/03/2017

## 2017-02-03 NOTE — Patient Instructions (Addendum)
Chest xray and labs today .  Continue on Symbicort and Spiriva  Continue on Zyrtec 10mg  At bedtime   Mucinex DM Twice daily  As needed  Cough/congestion .  follow up with Dr. Craige CottaSood  In 3 months and As needed   Please contact office for sooner follow up if symptoms do not improve or worsen or seek emergency care

## 2017-02-03 NOTE — Assessment & Plan Note (Signed)
Slow to resolve flare . Spirometry with Moderate CODP similar to 2016.  Check labs with cbc w/ diff . Allergy profile/IgE , BNP , bmet  Check cxr   Plan  Patient Instructions  Chest xray and labs today .  Continue on Symbicort and Spiriva  Continue on Zyrtec 10mg  At bedtime   Mucinex DM Twice daily  As needed  Cough/congestion .  follow up with Dr. Craige CottaSood  In 3 months and As needed   Please contact office for sooner follow up if symptoms do not improve or worsen or seek emergency care

## 2017-02-03 NOTE — Assessment & Plan Note (Signed)
Flare   Plan  Patient Instructions  Chest xray and labs today .  Continue on Symbicort and Spiriva  Continue on Zyrtec 10mg  At bedtime   Mucinex DM Twice daily  As needed  Cough/congestion .  follow up with Dr. Craige CottaSood  In 3 months and As needed   Please contact office for sooner follow up if symptoms do not improve or worsen or seek emergency care

## 2017-02-06 LAB — RESPIRATORY ALLERGY PROFILE REGION II ~~LOC~~
ALLERGEN, A. ALTERNATA, M6: 0.12 kU/L — AB
ALLERGEN, COTTONWOOD, T14: 0.13 kU/L — AB
ALLERGEN, MOUSE U PROTEIN, E72: 0.11 kU/L — AB
ALLERGEN, OAK, T7: 0.1 kU/L — AB
ASPERGILLUS FUMIGATUS M3: 1.21 kU/L — AB
Allergen, C. Herbarum, M2: <0.1 kU/L
Allergen, D pternoyssinus,d7: 0.35 kU/L — ABNORMAL HIGH
Allergen, Mulberry, t76: <0.1 kU/L
Bermuda Grass: <0.1 kU/L
Box Elder IgE: <0.1 kU/L
CAT DANDER: 0.94 kU/L — AB
COMMON RAGWEED: 0.21 kU/L — AB
Cockroach: <0.1 kU/L
D. farinae: 0.34 kU/L — ABNORMAL HIGH
Dog Dander: 19.5 kU/L — ABNORMAL HIGH
Elm IgE: 0.72 kU/L — ABNORMAL HIGH
IGE (IMMUNOGLOBULIN E), SERUM: 333 kU/L — AB (ref <115)
Johnson Grass: <0.1 kU/L
PECAN/HICKORY TREE IGE: 0.34 kU/L — AB
Sheep Sorrel IgE: <0.1 kU/L
Timothy Grass: <0.1 kU/L

## 2017-02-06 NOTE — Progress Notes (Signed)
I have reviewed and agree with assessment/plan.  Mary HellingVineet Karoline Fleer, MD [facility] Pulmonary/Critical Care 02/06/2017, 7:07 AM Pager:  [phone]

## 2017-02-08 ENCOUNTER — Ambulatory Visit
Admission: RE | Admit: 2017-02-08 | Discharge: 2017-02-08 | Disposition: A | Discharge status: Home / Self Care | Payer: Medicare Other | Source: Ambulatory Visit | Attending: Family Medicine | Admitting: Family Medicine

## 2017-02-08 DIAGNOSIS — Z1231 Encounter for screening mammogram for malignant neoplasm of breast: Secondary | ICD-10-CM

## 2017-02-09 ENCOUNTER — Telehealth: Payer: Self-pay | Admitting: Adult Health

## 2017-02-09 NOTE — Telephone Encounter (Signed)
Patient called back has additional questions about the results - feels like she didn't understand what was being told to her - she would like a call back from TP or VS - doesn't feel like she should come in to discuss this - pt can be reached at [phone] -pr

## 2017-02-09 NOTE — Telephone Encounter (Signed)
Notes recorded by Julio SicksParrett, Tammy S, NP on 02/07/2017 at 4:19 PM EDT Very positive for Dog dander.  Cat, mold , tree, was positive .  IgE is high as before .  Eosinophils are elevated as well .  She is on max therapy with symbicort , spiriva , singulair , zyrtec and flonase  Prev not tolerate xolair in pat.  Could consider starting Nucala injections . If she is okay with this , lets start process. If not can make ov to discuss results.  ------------------------------- Spoke with pt. She is aware of results. Pt wants to think about Nucala and check with her insurance to see how much she would have to pay out of pocket. Will await her call back.

## 2017-02-09 NOTE — Telephone Encounter (Signed)
Pt is requesting to speak with TP directly, as she has additional questions about labs results.  Pt would like to know pros and cons of Nucala. Pt also asked why Nucala is an option if she is already on max therapy. Pt states she has spoken with her insurance company and [place] is [place].   TP please advise. Thanks.

## 2017-02-13 NOTE — Telephone Encounter (Signed)
TP - please advise. Thanks. 

## 2017-02-15 NOTE — Telephone Encounter (Signed)
Spoke with pt. She is aware that TP is not back in the office until next week. Pt would rather not wait until TP returns. She requests to have VS call her if possible.  VS - please advise. Thanks.

## 2017-02-17 NOTE — Telephone Encounter (Signed)
Will send message to AG to wait for pt to call back to update us.

## 2017-02-17 NOTE — Telephone Encounter (Signed)
VS please advise. Thanks! 

## 2017-02-17 NOTE — Telephone Encounter (Signed)
Spoke with pt.   Explained tests results and what these mean in relation to her respiratory disease, and treatment planning.  Discussed indication for nucala and side effect profile.  Also discussed alternative of cinqair and fasenra.  She will look into which has better coverage with her insurance and let us know which she would like to pursue.

## 2017-02-23 NOTE — Telephone Encounter (Signed)
If all of them are pretty much the same (her impression after speaking with VS).  She knows the drug companies typically offer financial assistance for their medications - she would like to know which manufacturer offers the best financial assistance program and is more likely to approve patients.  This is the company/drug she would like to pursue.    Patient also stated that her insurance will cost her $200 copay per month/infusion + the office cost >> she would like an estimate on this office cost.  I spoke with Marisue IvanLiz about the [place] and Fasenra (these 2 are administered here while [facility]), Marisue IvanLiz does not know the office cost for administering these 2 medications.  Ashtyn, do you have any way we can assist patient with finding the best manufacturer option for patient assistance?  Maybe Florentina AddisonKatie would know?  Thanks!

## 2017-02-25 ENCOUNTER — Other Ambulatory Visit: Payer: Self-pay | Admitting: Pulmonary Disease

## 2017-03-01 NOTE — Telephone Encounter (Signed)
Pt would benefit with patient assistance better with Fasenra injections. Pt would take 1 injection monthly x 3 months, then 1 injection every 8 weeks. Pt will need to come by the office to sign forms on my desk and pick up starter kit information.    Once we have benefit information back we can go in greater detail with cost,etc per Insurance.   Please have patient come by office and ask to speak directly with me; I am available Friday until 12 noon. Thanks in advance. Thanks.

## 2017-03-02 NOTE — Telephone Encounter (Signed)
Spoke with American Electric PowerKatie. Pt can come in next week, preferably 8/8 after noon. Spoke with pt. She is aware of this and this works for her schedule. Will route message back to [place]atie.

## 2017-03-02 NOTE — Telephone Encounter (Signed)
Patient calling back regarding starting meds - Patient unable to come in tomorrow - she has 2 appointments in the morning - Patient would like a call back at [phone] -pr

## 2017-03-03 ENCOUNTER — Other Ambulatory Visit: Payer: Self-pay | Admitting: Internal Medicine

## 2017-03-06 ENCOUNTER — Telehealth: Payer: Self-pay | Admitting: Pulmonary Disease

## 2017-03-06 MED ORDER — BUDESONIDE-FORMOTEROL FUMARATE 160-4.5 MCG/ACT IN AERO: 2.0000 | INHALATION_SPRAY | Freq: Two times a day (BID) | RESPIRATORY_TRACT | 3 refills | Status: DC

## 2017-03-06 NOTE — Telephone Encounter (Signed)
Spoke with pt, who request Rx for Symbicort 160 to be sent as 90 day supply. Rx has been sent to preferred pharmacy. Nothing further needed.

## 2017-03-07 NOTE — Telephone Encounter (Addendum)
Will send to Florentina AddisonKatie again to make aware and advise if this still works.

## 2017-03-13 NOTE — Telephone Encounter (Signed)
Pt came in on 03-08-17 afternoon and completed paperwork for Fasenra. Will sign off on message.

## 2017-03-13 NOTE — Telephone Encounter (Signed)
Katie please advise of another date that we can work the pt in.  Thanks

## 2017-03-27 ENCOUNTER — Telehealth: Payer: Self-pay | Admitting: Pulmonary Disease

## 2017-03-27 NOTE — Telephone Encounter (Signed)
I called Mary [name] and explained to her about the benefits summary and the fact we will probably have to do a p/a. She understood. I told her I would call her as soon as I hear something.

## 2017-03-27 NOTE — Telephone Encounter (Signed)
Will route to Tammy Scott.  

## 2017-03-31 NOTE — Telephone Encounter (Addendum)
I called [employer] [facility] to do a verbal p/a, it was approved Case ID# PA- [id number] Dates: 03/31/17-07/30/17. Called pt. To make her aware lmomtcb. I also let her know the p/a would take a couple of days to process. I will wait for p/a to process.

## 2017-04-02 ENCOUNTER — Other Ambulatory Visit: Payer: Self-pay | Admitting: Pulmonary Disease

## 2017-04-04 NOTE — Telephone Encounter (Signed)
Received a fa from [facility] approving medication Fasenra injection 30mg /ml. Approved until 07/31/2017

## 2017-04-07 NOTE — Telephone Encounter (Signed)
I spoke with access [employer] rep. And according to him there was no p/a. He also sent me a p/a form. I am going to call the ins. Company 04/10/17 and find out what's going on.( As it is now 5:30)

## 2017-04-11 ENCOUNTER — Telehealth: Payer: Self-pay | Admitting: Pulmonary Disease
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
## 2017-04-11 NOTE — Telephone Encounter (Signed)
Harrington ChallengerFasenra Pre-filled Syringe: 1 Order Date: 04/11/17 Ship Date: 04/17/17

## 2017-04-18 NOTE — Telephone Encounter (Signed)
Fasenra received on 04/18/2017 1 pre-filled [place] LOT# [id number] EXP:08/2018

## 2017-04-19 ENCOUNTER — Telehealth: Payer: Self-pay | Admitting: Pulmonary Disease

## 2017-04-19 MED ORDER — EPINEPHRINE 0.3 MG/0.3ML IJ SOAJ: 0.3000 mg | Freq: Once | INTRAMUSCULAR | 11 refills | Status: AC

## 2017-04-19 NOTE — Telephone Encounter (Signed)
Pt aware of 2 hour wait time and Epipen Rx sent to confirmed pharmacy of [facility] [address]    Pt will bring Epipen to visit. Nothing more needed at this time.

## 2017-04-21 ENCOUNTER — Ambulatory Visit (INDEPENDENT_AMBULATORY_CARE_PROVIDER_SITE_OTHER): Payer: Medicare Other

## 2017-04-21 ENCOUNTER — Ambulatory Visit: Payer: Medicare Other

## 2017-04-21 DIAGNOSIS — J82 Pulmonary eosinophilia, not elsewhere classified: Secondary | ICD-10-CM

## 2017-04-21 DIAGNOSIS — J8283 Eosinophilic asthma: Secondary | ICD-10-CM

## 2017-04-25 MED ORDER — BENRALIZUMAB 30 MG/ML ~~LOC~~ SOSY
30.0000 mg | PREFILLED_SYRINGE | Freq: Once | SUBCUTANEOUS | Status: AC
  Administered 2017-04-21: 30 mg via SUBCUTANEOUS

## 2017-04-25 NOTE — Progress Notes (Signed)
Fasenra injection documentation and charges entered by Ashley Caulfield, RMA, based on injection sheet filled out by Katie Welchel, CMA  per office protocol.   

## 2017-05-10 ENCOUNTER — Telehealth: Payer: Self-pay | Admitting: Pulmonary Disease

## 2017-05-10 NOTE — Telephone Encounter (Signed)
Called 05/10/17 to order pt.'s fasenra, there is no consent to ship on file. Mary [name] has tried to call the pt. Several time with no success. I called the pt. And had to leave a message, asking her to call [employer] and give them permission to ship her med.. Pt. Called back just a min. Ago. Hopefully she will call them now and [employer] will call me back to set up shipment. (tomorrow) Waiting

## 2017-05-12 ENCOUNTER — Telehealth: Payer: Self-pay | Admitting: Pulmonary Disease

## 2017-05-12 NOTE — Telephone Encounter (Signed)
Pt.Called [employer] and gave permission, nothing further needed.

## 2017-05-12 NOTE — Telephone Encounter (Signed)
Fasenra Prefilled Syringe: 1  Arrival date: 05/12/17 Lot#: [id number]  Exp.: 09/2018 After 3 or 4 calls and being transferred, I forgot to put the order in. (crazy busy too)

## 2017-05-21 ENCOUNTER — Other Ambulatory Visit: Payer: Self-pay | Admitting: Pulmonary Disease

## 2017-05-22 ENCOUNTER — Telehealth: Payer: Self-pay | Admitting: Internal Medicine

## 2017-05-22 ENCOUNTER — Ambulatory Visit (INDEPENDENT_AMBULATORY_CARE_PROVIDER_SITE_OTHER): Payer: Medicare Other

## 2017-05-22 DIAGNOSIS — J4550 Severe persistent asthma, uncomplicated: Secondary | ICD-10-CM | POA: Diagnosis not present

## 2017-05-22 NOTE — Telephone Encounter (Signed)
I found Rx in the requests and refilled her Singular. Nothing further is needed.

## 2017-05-22 NOTE — Telephone Encounter (Signed)
Called pt to get more information on which Rx she is referring to. No answer and unable to leave VM

## 2017-05-23 MED ORDER — BENRALIZUMAB 30 MG/ML ~~LOC~~ SOSY
30.0000 mg | PREFILLED_SYRINGE | Freq: Once | SUBCUTANEOUS | Status: AC
  Administered 2017-05-22: 30 mg via SUBCUTANEOUS

## 2017-06-30 ENCOUNTER — Telehealth: Payer: Self-pay | Admitting: Pulmonary Disease

## 2017-06-30 NOTE — Telephone Encounter (Signed)
Harrington ChallengerFasenra Pre-Filled Syringe: 1 Order Date: 06/30/17 Ship Date: 07/03/17

## 2017-07-04 NOTE — Telephone Encounter (Signed)
Harrington ChallengerFasenra Pre-Filled Syringe: 1 Arrival Date: 07/04/17 Lot#: [id number] Exp Date: 09/2018

## 2017-07-06 ENCOUNTER — Telehealth: Payer: Self-pay | Admitting: Pulmonary Disease

## 2017-07-06 NOTE — Telephone Encounter (Signed)
Spoke with pt, when she got her shot in Oct. She was told to come back in 2 months. ( I was off that day.) This will be her third shot after this, pt. Will come every 8 weeks. Pt. Is coming tomorrow for shot.(fasenra) Nothing further needed.

## 2017-07-06 NOTE — Telephone Encounter (Signed)
TS please call pt.

## 2017-07-07 ENCOUNTER — Other Ambulatory Visit: Payer: Self-pay | Admitting: Pulmonary Disease

## 2017-07-07 ENCOUNTER — Ambulatory Visit: Payer: Medicare Other

## 2017-07-11 ENCOUNTER — Ambulatory Visit: Payer: Medicare Other

## 2017-07-17 ENCOUNTER — Ambulatory Visit (INDEPENDENT_AMBULATORY_CARE_PROVIDER_SITE_OTHER): Payer: Medicare Other

## 2017-07-17 DIAGNOSIS — J4551 Severe persistent asthma with (acute) exacerbation: Secondary | ICD-10-CM

## 2017-07-18 MED ORDER — BENRALIZUMAB 30 MG/ML ~~LOC~~ SOSY
30.0000 mg | PREFILLED_SYRINGE | Freq: Once | SUBCUTANEOUS | Status: AC
  Administered 2017-07-17: 30 mg via SUBCUTANEOUS

## 2017-07-27 ENCOUNTER — Ambulatory Visit: Payer: Medicare Other

## 2017-07-27 ENCOUNTER — Other Ambulatory Visit (HOSPITAL_COMMUNITY): Payer: Self-pay | Admitting: *Deleted

## 2017-07-28 ENCOUNTER — Ambulatory Visit (HOSPITAL_COMMUNITY)
Admission: RE | Admit: 2017-07-28 | Discharge: 2017-07-28 | Disposition: A | Discharge status: Home / Self Care | Payer: Medicare Other | Source: Ambulatory Visit | Attending: Obstetrics and Gynecology | Admitting: Obstetrics and Gynecology

## 2017-07-28 DIAGNOSIS — M810 Age-related osteoporosis without current pathological fracture: Secondary | ICD-10-CM | POA: Diagnosis present

## 2017-07-28 MED ORDER — ZOLEDRONIC ACID 5 MG/100ML IV SOLN
5.0000 mg | Freq: Once | INTRAVENOUS | Status: AC
  Administered 2017-07-28: 5 mg via INTRAVENOUS

## 2017-07-28 MED ORDER — ZOLEDRONIC ACID 5 MG/100ML IV SOLN
INTRAVENOUS | Status: AC
  Administered 2017-07-28: 5 mg via INTRAVENOUS
  Filled 2017-07-28: qty 100

## 2017-07-30 ENCOUNTER — Other Ambulatory Visit: Payer: Self-pay | Admitting: Pulmonary Disease

## 2017-08-18 ENCOUNTER — Other Ambulatory Visit: Payer: Self-pay | Admitting: Pulmonary Disease

## 2017-08-26 ENCOUNTER — Other Ambulatory Visit: Payer: Self-pay | Admitting: Pulmonary Disease

## 2017-09-06 ENCOUNTER — Other Ambulatory Visit: Payer: Self-pay | Admitting: Internal Medicine

## 2017-09-07 ENCOUNTER — Telehealth: Payer: Self-pay | Admitting: Pulmonary Disease

## 2017-09-07 ENCOUNTER — Encounter: Payer: Self-pay | Admitting: Internal Medicine

## 2017-09-07 MED ORDER — BUDESONIDE-FORMOTEROL FUMARATE 160-4.5 MCG/ACT IN AERO: 2.0000 | INHALATION_SPRAY | Freq: Two times a day (BID) | RESPIRATORY_TRACT | 3 refills | Status: DC

## 2017-09-07 NOTE — Telephone Encounter (Signed)
Opened in error

## 2017-09-07 NOTE — Telephone Encounter (Signed)
Called patient. Sent refill to verified pharmacy.

## 2017-09-07 NOTE — Telephone Encounter (Signed)
VS pt 

## 2017-09-08 ENCOUNTER — Telehealth: Payer: Self-pay | Admitting: Pulmonary Disease

## 2017-09-08 NOTE — Telephone Encounter (Signed)
Routing to Tammy Scott to follow up on.  

## 2017-09-08 NOTE — Telephone Encounter (Signed)
Will route to TS 

## 2017-09-08 NOTE — Telephone Encounter (Signed)
Pt is calling back Hewlett-Pack[employer]ammy Scott about Harrington ChallengerFasenra  [phone]

## 2017-09-08 NOTE — Telephone Encounter (Signed)
Patient calling to speak to [facility] about Harrington ChallengerFasenra, [place] is [phone].

## 2017-09-08 NOTE — Telephone Encounter (Signed)
rec'd RX refill for pt's symbicort 160, last seen 02-03-17 by TP okay for refill Refilled RX today Nothing further needed

## 2017-09-12 ENCOUNTER — Ambulatory Visit: Payer: Medicare Other

## 2017-09-12 NOTE — Telephone Encounter (Signed)
Pt is calling back about Fasenra. Pt requesting TS call Insurance company,[employer] and also [facility] to give them the number for the PA number. Cb is [id number].

## 2017-09-12 NOTE — Telephone Encounter (Signed)
Talked to pt., we had a terrible connection the first time. I called the # on the electronic p/a gave rep ins. Id #. I put it in yesterday, I tried to save it, it wouldn't work. I put it in again today and was able to save it. (the p/a)  I then called Mrs. Daum back, much better connection. I told her I had already called and gave needed info.

## 2017-09-12 NOTE — Telephone Encounter (Signed)
Routing to TS to follow up on.    TS please advise, thanks!

## 2017-09-12 NOTE — Telephone Encounter (Signed)
TS please advise.  

## 2017-09-15 ENCOUNTER — Telehealth: Payer: Self-pay | Admitting: Pulmonary Disease

## 2017-09-18 NOTE — Telephone Encounter (Signed)
Patient called back stating [employer] is stating they just need authorization for this. CB is [id number].  She is aware Tammy S is not in the office today.

## 2017-09-18 NOTE — Telephone Encounter (Signed)
TS can we please advise on this, thanks.

## 2017-09-20 NOTE — Telephone Encounter (Signed)
TS please advise. Thanks 

## 2017-09-20 NOTE — Telephone Encounter (Signed)
I did her p/a electronically, I called Mrs. Mary [name] and had to leave a message. Pt. Called me back, I made her aware the p/a was sent and told her it take up to 3 days to process. Pt. Voiced understanding. Will wait to hear from p/a.

## 2017-09-21 ENCOUNTER — Other Ambulatory Visit: Payer: Self-pay | Admitting: Pulmonary Disease

## 2017-09-21 NOTE — Telephone Encounter (Signed)
Received a refill request of omeprazole 20mg  capsule.  Pt last saw TP 02/03/17 and was to follow up with Dr. Craige CottaSood  In 3 months.  No current OV has been scheduled since 02/03/17.  Dr. Craige CottaSood, please advise if you are okay with us refilling this med for pt.  Thanks!

## 2017-09-22 NOTE — Telephone Encounter (Signed)
Okay to send refill. 

## 2017-09-27 NOTE — Telephone Encounter (Signed)
Tammy, please advise if you have an update on PA that was done. Thanks!

## 2017-10-03 NOTE — Telephone Encounter (Signed)
Tammy please advise on this, thanks. 

## 2017-10-04 ENCOUNTER — Telehealth: Payer: Self-pay | Admitting: *Deleted

## 2017-10-04 NOTE — Telephone Encounter (Signed)
1 prefilled syringe Ordered date: 10/04/2017 Shipping date: 10/04/2017

## 2017-10-04 NOTE — Telephone Encounter (Signed)
Yes, the p/a was done, however it was denied. Per Florentina AddisonKatie we can buy & bill, pt. Will be responsible for $50 each time (q 8 wks.) Ordering now. Nothing further needed.

## 2017-10-05 ENCOUNTER — Ambulatory Visit (INDEPENDENT_AMBULATORY_CARE_PROVIDER_SITE_OTHER): Payer: Medicare Other

## 2017-10-05 ENCOUNTER — Telehealth: Payer: Self-pay | Admitting: Pulmonary Disease

## 2017-10-05 DIAGNOSIS — J4551 Severe persistent asthma with (acute) exacerbation: Secondary | ICD-10-CM | POA: Diagnosis not present

## 2017-10-05 NOTE — Telephone Encounter (Signed)
Order was closed out before med. Came in. Arrival Date:10/05/2017 Lot #: [id number] Exp date: 09/2018

## 2017-10-05 NOTE — Telephone Encounter (Signed)
Error

## 2017-10-05 NOTE — Telephone Encounter (Signed)
Will close encounter, as nothing further is needed.  

## 2017-10-06 ENCOUNTER — Telehealth: Payer: Self-pay | Admitting: *Deleted

## 2017-10-09 NOTE — Telephone Encounter (Signed)
Pt is returning call. Cb is [phone]

## 2017-10-09 NOTE — Telephone Encounter (Signed)
TS please advise.  

## 2017-10-09 NOTE — Telephone Encounter (Signed)
Returned pt's call nothing further needed.

## 2017-10-13 ENCOUNTER — Telehealth: Payer: Self-pay | Admitting: *Deleted

## 2017-10-13 NOTE — Telephone Encounter (Signed)
Done nothing further needed.

## 2017-10-16 MED ORDER — BENRALIZUMAB 30 MG/ML ~~LOC~~ SOSY
30.0000 mg | PREFILLED_SYRINGE | Freq: Once | SUBCUTANEOUS | Status: AC
  Administered 2017-10-05: 30 mg via SUBCUTANEOUS

## 2017-11-06 ENCOUNTER — Other Ambulatory Visit: Payer: Self-pay | Admitting: Pulmonary Disease

## 2017-11-22 ENCOUNTER — Telehealth: Payer: Self-pay | Admitting: Pulmonary Disease

## 2017-11-22 ENCOUNTER — Other Ambulatory Visit: Payer: Self-pay | Admitting: Pulmonary Disease

## 2017-11-22 NOTE — Telephone Encounter (Signed)
1 prefilled syringe Ordered date: 4.24.19 Shipping date: 4.25.19

## 2017-11-23 NOTE — Telephone Encounter (Signed)
Arrival Date:11/23/17 Lot #: [id number] Exp date: 10/2018

## 2017-11-23 NOTE — Telephone Encounter (Signed)
Arrival Date:11/23/17 Lot #: [id number] Exp date: 10/2018 

## 2017-11-28 ENCOUNTER — Other Ambulatory Visit: Payer: Self-pay | Admitting: Pulmonary Disease

## 2017-11-28 ENCOUNTER — Telehealth: Payer: Self-pay | Admitting: Pulmonary Disease

## 2017-11-28 NOTE — Telephone Encounter (Signed)
I returned Daisy's call to make [facility] aware we don't order pt's fasenra from them, pt. Is [employer] and [employer]. Nothing further needed.

## 2017-11-30 ENCOUNTER — Ambulatory Visit: Payer: Medicare Other

## 2017-11-30 ENCOUNTER — Other Ambulatory Visit: Payer: Self-pay | Admitting: Pulmonary Disease

## 2017-12-01 ENCOUNTER — Ambulatory Visit (INDEPENDENT_AMBULATORY_CARE_PROVIDER_SITE_OTHER): Payer: Medicare Other

## 2017-12-01 DIAGNOSIS — J4550 Severe persistent asthma, uncomplicated: Secondary | ICD-10-CM | POA: Diagnosis not present

## 2017-12-01 DIAGNOSIS — J449 Chronic obstructive pulmonary disease, unspecified: Secondary | ICD-10-CM

## 2017-12-04 ENCOUNTER — Encounter: Payer: Self-pay | Admitting: Primary Care

## 2017-12-04 MED ORDER — BENRALIZUMAB 30 MG/ML ~~LOC~~ SOSY
30.0000 mg | PREFILLED_SYRINGE | Freq: Once | SUBCUTANEOUS | Status: AC
  Administered 2017-12-01: 30 mg via SUBCUTANEOUS

## 2017-12-11 ENCOUNTER — Telehealth: Payer: Self-pay | Admitting: *Deleted

## 2017-12-14 NOTE — Telephone Encounter (Signed)
Tammy can you please provide an update on this matter? Thanks. 

## 2017-12-19 ENCOUNTER — Other Ambulatory Visit: Payer: Self-pay | Admitting: Pulmonary Disease

## 2017-12-21 DIAGNOSIS — Z860101 Personal history of adenomatous and serrated colon polyps: Secondary | ICD-10-CM | POA: Insufficient documentation

## 2017-12-21 NOTE — Telephone Encounter (Signed)
Pt. Is buy & bill. Called [facility] to make them aware the p/a does not go thru them. P/a has to go thru ins as medical. Called and spoke with Sam at [facility], he asked for the procedure code. I gave 2 to him, he replied No Prior Authorization needed. (Conversation was recorded.) Nothing further needed. Called pt and make her aware a p/a is not needed. (Lmom)

## 2018-01-05 ENCOUNTER — Other Ambulatory Visit: Payer: Self-pay | Admitting: Family Medicine

## 2018-01-05 DIAGNOSIS — Z1231 Encounter for screening mammogram for malignant neoplasm of breast: Secondary | ICD-10-CM

## 2018-01-23 ENCOUNTER — Telehealth: Payer: Self-pay | Admitting: Pulmonary Disease

## 2018-01-24 ENCOUNTER — Telehealth: Payer: Self-pay | Admitting: Pulmonary Disease

## 2018-01-24 NOTE — Telephone Encounter (Signed)
Arrival Date:01/24/18 Lot #: [id number] Exp date: 04/2019 

## 2018-01-24 NOTE — Telephone Encounter (Signed)
1 prefilled syringe Ordered date: 01/23/18 Shipping date: 01/23/18  

## 2018-01-24 NOTE — Telephone Encounter (Signed)
Error

## 2018-01-29 ENCOUNTER — Ambulatory Visit: Payer: Medicare Other

## 2018-02-02 ENCOUNTER — Ambulatory Visit (INDEPENDENT_AMBULATORY_CARE_PROVIDER_SITE_OTHER): Payer: Medicare Other

## 2018-02-02 ENCOUNTER — Encounter: Payer: Self-pay | Admitting: Pulmonary Disease

## 2018-02-02 ENCOUNTER — Telehealth: Payer: Self-pay | Admitting: Pulmonary Disease

## 2018-02-02 ENCOUNTER — Ambulatory Visit: Payer: Medicare Other | Admitting: Pulmonary Disease

## 2018-02-02 VITALS — BP 118/72 | HR 80 | Ht 65.0 in | Wt 183.8 lb

## 2018-02-02 DIAGNOSIS — J449 Chronic obstructive pulmonary disease, unspecified: Secondary | ICD-10-CM

## 2018-02-02 DIAGNOSIS — J4551 Severe persistent asthma with (acute) exacerbation: Secondary | ICD-10-CM

## 2018-02-02 MED ORDER — ALBUTEROL SULFATE HFA 108 (90 BASE) MCG/ACT IN AERS: 2.0000 | INHALATION_SPRAY | Freq: Four times a day (QID) | RESPIRATORY_TRACT | 6 refills | Status: DC | PRN

## 2018-02-02 MED ORDER — PREDNISONE 10 MG PO TABS
ORAL_TABLET | ORAL | 0 refills | Status: DC
Start: 2018-02-02 — End: 2019-02-08

## 2018-02-02 MED ORDER — ALBUTEROL SULFATE (2.5 MG/3ML) 0.083% IN NEBU: 2.5000 mg | INHALATION_SOLUTION | Freq: Four times a day (QID) | RESPIRATORY_TRACT | 5 refills | Status: DC | PRN

## 2018-02-02 MED ORDER — AZITHROMYCIN 250 MG PO TABS: ORAL_TABLET | ORAL | 0 refills | Status: DC

## 2018-02-02 MED ORDER — ALBUTEROL SULFATE HFA 108 (90 BASE) MCG/ACT IN AERS: 2.0000 | INHALATION_SPRAY | Freq: Four times a day (QID) | RESPIRATORY_TRACT | 3 refills | Status: DC | PRN

## 2018-02-02 NOTE — Assessment & Plan Note (Signed)
Continue Fasenra Prednisone 10mg  tablet  >>> Prednisone 20 mg daily for next 5 days Azithromycin 250mg  tablet  >>> Take 2 tablets today (500 mg total), 1 250 mg tablet for next four days   Continue Singulair Can use Flonase as needed for allergy symptoms  Continue Symbicort Continue Spiriva  Follow-up in 3 months

## 2018-02-02 NOTE — Progress Notes (Signed)
@Patient  ID: Mary [name], female    DOB: 06/13/1951, 67 y.o.   MRN: [id number]  Chief Complaint  Patient presents with  . Acute Visit    Increased SOB more frequently, increased wheezing, congested cough which causes strangling from thick phlegm x last month.     Referring provider: Caffie Damme, MD  HPI:   Recent  Pulmonary Encounters:   67 yo female former smoker followed for chronic obstructive asthma , AR , COPD /Emphysema   TEST  Pulmonary tests  RAST 02/28/08 >> IgE 327.6, multiple allergens PFT 02/28/08 >> FEV1 1.44(58%), FEV1% 48, TLC 5.25(99%), DLCO 76%  PFT 02/05/10 >> FEV1 1.44(60%), FEV1% 51, TLC 5.22(99%), DLCO 78%, +BD  Started xolair 08/14/08 >> stopped 09/10 (pt intolerant of medication) Spirometry 09/12/11 >> FEV1 1.28(60%), FEV1% 55 CT chest 04/18/14 >> centrilobular emphysema Spirometry 12/04/14 >> FEV1 1.22 (62%), FEV1% 52   02/02/18 Acute  67 year old patient seen office today for shortness of breath and wheezing.  Patient came in today to receive her Fasenra dose.  Patient reported she is been needing to use her nebulizers more.  Patient is without rescue inhaler.  Patient reporting she needs albuterol nebulizers increased.    No Known Allergies  Immunization History  Administered Date(s) Administered  . Influenza Split 04/02/2011, 04/01/2015  . Influenza Whole 04/01/1997, 05/01/2012, 03/15/2014  . Influenza,inj,Quad PF,6+ Mos 03/07/2016  . Influenza-Unspecified 04/05/2017  . Pneumococcal Conjugate-13 06/03/2015  . Pneumococcal Polysaccharide-23 05/04/2007  . Td 04/01/1997    Past Medical History:  Diagnosis Date  . Allergic rhinitis   . Chronic obstructive asthma   . Colon polyp   . Esophageal stricture   . Exercise-induced bronchoconstriction   . GERD (gastroesophageal reflux disease)   . Hypothyroidism   . Osteoporosis   . Pneumonia     Tobacco History: Social History   Tobacco Use  Smoking Status Former Smoker  .  Packs/day: 1.00  . Years: 10.00  . Pack years: 10.00  . Types: Cigarettes  . Last attempt to quit: 08/02/1979  . Years since quitting: 38.5  Smokeless Tobacco Never Used   Counseling given: Yes Continue not smoking.  Outpatient Encounter Medications as of 02/02/2018  Medication Sig  . albuterol (PROAIR HFA) 108 (90 Base) MCG/ACT inhaler Inhale 2 puffs into the lungs every 6 (six) hours as needed.  Marland Kitchen albuterol (PROVENTIL) (2.5 MG/3ML) 0.083% nebulizer solution Take 3 mLs (2.5 mg total) by nebulization every 6 (six) hours as needed for wheezing or shortness of breath.  . budesonide-formoterol (SYMBICORT) 160-4.5 MCG/ACT inhaler Inhale 2 puffs into the lungs 2 (two) times daily.  Marland Kitchen co-enzyme Q-10 30 MG capsule Take 30 mg by mouth daily.  . fluticasone (FLONASE) 50 MCG/ACT nasal spray Place 2 sprays into both nostrils daily.  Marland Kitchen guaiFENesin (MUCINEX) 600 MG 12 hr tablet Take 1,200 mg by mouth 2 (two) times daily.   . Lactobacillus Rhamnosus, GG, (CULTURELLE PO) Take by mouth.  . levothyroxine (SYNTHROID, LEVOTHROID) 75 MCG tablet Take 1 tablet by mouth daily.  . Magnesium 100 MG CAPS Take by mouth.  . montelukast (SINGULAIR) 10 MG tablet TAKE 1 TABLET BY MOUTH EVERY NIGHT AT BEDTIME  . omeprazole (PRILOSEC) 20 MG capsule TAKE 1 TABLET BY MOUTH TWICE DAILY 30 MINUTES PRIOR TO FIRST AND LAST MEAL OF THE DAY  . raloxifene (EVISTA) 60 MG tablet Take 60 mg by mouth daily.  Marland Kitchen rOPINIRole (REQUIP) 0.5 MG tablet Take 0.5 mg by mouth at bedtime.  . simvastatin (ZOCOR) 10  MG tablet Take 10 mg by mouth daily.  Marland Kitchen. SPIRIVA HANDIHALER 18 MCG inhalation capsule INHALE 1 CAPSULE VIA INHALER EVERY DAY  . SYMBICORT 160-4.5 MCG/ACT inhaler INHALE 2 PUFFS TWICE DAILY  . THEO-24 100 MG 24 hr capsule Take 1 capsule (100 mg total) by mouth daily.  Marland Kitchen. azithromycin (ZITHROMAX) 250 MG tablet 500mg  (two tablets) today, then 250mg  (1 tablet) for the next 4 days  . omeprazole (PRILOSEC) 20 MG capsule TAKE 1 CAPSULE BY MOUTH  TWICE DAILY 30 MINUTES BEFORE FIRST AND LAST MEAL (Patient not taking: Reported on 02/02/2018)  . predniSONE (DELTASONE) 10 MG tablet Take 2 tablets (20 mg total) daily with breakfast for the next 5 days.  . [DISCONTINUED] predniSONE (DELTASONE) 10 MG tablet 4 pills for 2 days, 3 pills for 2 days, 2 pills for 2 days, 1 pill for 2 days (Patient not taking: Reported on 02/02/2018)   No facility-administered encounter medications on file as of 02/02/2018.      Review of Systems  Review of Systems  Constitutional: Positive for fatigue. Negative for appetite change, chills and fever.  HENT: Negative for congestion, facial swelling, postnasal drip, sinus pressure, sinus pain and sneezing.   Respiratory: Positive for cough (productive cough - with white thick mucous ), chest tightness, shortness of breath and wheezing.   Cardiovascular: Negative for chest pain and palpitations.  Gastrointestinal: Negative for abdominal distention, nausea and vomiting.  Musculoskeletal: Negative for arthralgias.  Neurological: Negative for light-headedness and headaches.  Psychiatric/Behavioral: Negative for dysphoric mood and sleep disturbance.  All other systems reviewed and are negative.      Physical Exam  BP 118/72   Pulse 80   Ht 5\' 5"  (1.651 m)   Wt 183 lb 12.8 oz (83.4 kg)   SpO2 99%   BMI 30.59 kg/m   Wt Readings from Last 5 Encounters:  02/02/18 183 lb 12.8 oz (83.4 kg)  07/28/17 178 lb (80.7 kg)  02/03/17 180 lb 9.6 oz (81.9 kg)  12/01/16 173 lb 9.6 oz (78.7 kg)  07/21/16 170 lb (77.1 kg)     Physical Exam  Constitutional: She is oriented to person, place, and time and well-developed, well-nourished, and in no distress. No distress.  HENT:  Head: Normocephalic.  Right Ear: External ear normal.  Left Ear: External ear normal.  Nose: Nose normal.  Mouth/Throat: Oropharynx is clear and moist.  Eyes: Pupils are equal, round, and reactive to light.  Neck: Normal range of motion. Neck  supple. No thyromegaly present.  Cardiovascular: Normal rate, regular rhythm and normal heart sounds.  Pulmonary/Chest: Effort normal. No respiratory distress. She has wheezes (Expiratory wheezes throughout all lobes throughout exam).  Abdominal: Soft. Bowel sounds are normal. There is no tenderness.  Musculoskeletal: Normal range of motion.  Lymphadenopathy:    She has no cervical adenopathy.  Neurological: She is oriented to person, place, and time.  Skin: Skin is warm and dry. She is not diaphoretic.  Psychiatric: Mood, memory, affect and judgment normal.  Nursing note and vitals reviewed.     Lab Results:  CBC    Component Value Date/Time   WBC 10.9 (H) 02/03/2017 1718   RBC 4.41 02/03/2017 1718   HGB 14.0 02/03/2017 1718   HCT 41.7 02/03/2017 1718   PLT 304.0 02/03/2017 1718   MCV 94.4 02/03/2017 1718   MCHC 33.6 02/03/2017 1718   RDW 13.8 02/03/2017 1718   LYMPHSABS 4.4 (H) 02/03/2017 1718   MONOABS 1.0 02/03/2017 1718   EOSABS 0.6 02/03/2017  1718   BASOSABS 0.1 02/03/2017 1718    BMET    Component Value Date/Time   NA 138 02/03/2017 1718   K 4.2 02/03/2017 1718   CL 101 02/03/2017 1718   CO2 32 02/03/2017 1718   GLUCOSE 104 (H) 02/03/2017 1718   BUN 14 02/03/2017 1718   CREATININE 0.98 02/03/2017 1718   CALCIUM 10.0 02/03/2017 1718    BNP No results found for: BNP  ProBNP    Component Value Date/Time   PROBNP 16.0 02/03/2017 1718    Imaging: No results found.   Assessment & Plan:   Pleasant 67 year old patient seen office today.  Patient with asthma exacerbation.  Patient did get her Harrington Challenger dose today.  Patient be treated with Z-Pak as well as prednisone.    Patient to come in in 3 months to see Dr. Craige Cotta.  COPD with asthma Continue Fasenra Prednisone 10mg  tablet  >>> Prednisone 20 mg daily for next 5 days Azithromycin 250mg  tablet  >>> Take 2 tablets today (500 mg total), 1 250 mg tablet for next four days   Continue Singulair Can use  Flonase as needed for allergy symptoms  Continue Symbicort Continue Spiriva  Follow-up in 3 months  This appointment was 27 minutes along with over 50% of the time direct face-to-face patient care.   Coral Ceo, NP 02/02/2018

## 2018-02-02 NOTE — Telephone Encounter (Signed)
Came in for HasletFasenra shot today. She's had a cough, it's worse at night and she's and she's having to use her nebulizer more. VS is not here, Elisha HeadlandBrian Mack is seeing her today. She was wanting a refill for her albuterol sol. [facility] and she [facility] it to [place].  MW is DOD he said it was ok to give her her fasenra inj. And fill Neb sol. One time. No refills.

## 2018-02-02 NOTE — Progress Notes (Signed)
Reviewed and agree with assessment/plan.   Modelle Vollmer, MD [facility] 07/27/2016, 12:24 PM Pager:  [phone]  

## 2018-02-02 NOTE — Telephone Encounter (Signed)
I asked [place] if [facility] "Yes", I just wanted to make sure that was taken care of. Nothing further needed.

## 2018-02-02 NOTE — Addendum Note (Signed)
Addended by: Kerin RansomBLACKWELL, Aquarius Tremper on: 02/02/2018 03:49 PM   Modules accepted: Orders

## 2018-02-02 NOTE — Patient Instructions (Addendum)
Continue Fasenra Prednisone 10mg  tablet  >>> Prednisone 20 mg daily for next 5 days Azithromycin 250mg  tablet  >>> Take 2 tablets today (500 mg total), 1 250 mg tablet for next four days   Continue Singulair Can use Flonase as needed for allergy symptoms  Continue Symbicort Continue Spiriva  Follow-up in 3 months    Please contact the office if your symptoms worsen or you have concerns that you are not improving.   Thank you for choosing [facility] for your healthcare, and for allowing us to partner with you on your healthcare journey. I am thankful to be able to provide care to you today.   Elisha HeadlandBrian Dalisa Forrer FNP-C

## 2018-02-06 MED ORDER — BENRALIZUMAB 30 MG/ML ~~LOC~~ SOSY
30.0000 mg | PREFILLED_SYRINGE | Freq: Once | SUBCUTANEOUS | Status: AC
  Administered 2018-02-02: 30 mg via SUBCUTANEOUS

## 2018-02-06 NOTE — Progress Notes (Signed)
Documentation of medication administration and charges of Fasenra have been completed by Lindsay Lemons, CMA based on the Fasenra documentation sheet completed by Tammy Scott.   

## 2018-02-12 ENCOUNTER — Ambulatory Visit
Admission: RE | Admit: 2018-02-12 | Discharge: 2018-02-12 | Disposition: A | Discharge status: Home / Self Care | Payer: Medicare Other | Source: Ambulatory Visit | Attending: Family Medicine | Admitting: Family Medicine

## 2018-02-12 DIAGNOSIS — Z1231 Encounter for screening mammogram for malignant neoplasm of breast: Secondary | ICD-10-CM

## 2018-02-16 ENCOUNTER — Other Ambulatory Visit: Payer: Self-pay | Admitting: Pulmonary Disease

## 2018-03-04 ENCOUNTER — Other Ambulatory Visit: Payer: Self-pay | Admitting: Pulmonary Disease

## 2018-03-06 ENCOUNTER — Telehealth: Payer: Self-pay | Admitting: Pulmonary Disease

## 2018-03-06 ENCOUNTER — Encounter: Payer: Self-pay | Admitting: Pulmonary Disease

## 2018-03-06 MED ORDER — FLUTICASONE PROPIONATE 50 MCG/ACT NA SUSP: 2.0000 | Freq: Every day | NASAL | 5 refills | Status: DC

## 2018-03-06 NOTE — Telephone Encounter (Signed)
Pt requesting a refill on Flonase.  This has been sent to preferred pharmacy.  Nothing further needed.

## 2018-03-14 ENCOUNTER — Other Ambulatory Visit: Payer: Self-pay

## 2018-03-14 MED ORDER — BUDESONIDE-FORMOTEROL FUMARATE 160-4.5 MCG/ACT IN AERO: 2.0000 | INHALATION_SPRAY | Freq: Two times a day (BID) | RESPIRATORY_TRACT | 3 refills | Status: DC

## 2018-03-27 ENCOUNTER — Telehealth: Payer: Self-pay | Admitting: Pulmonary Disease

## 2018-03-27 NOTE — Telephone Encounter (Signed)
1 prefilled syringe Ordered date: 03/27/18 Shipping date: 03/27/18  

## 2018-03-28 NOTE — Telephone Encounter (Signed)
Arrival Date:03/28/18 Lot #: [id number] Exp date: 04/2019

## 2018-03-30 ENCOUNTER — Ambulatory Visit (INDEPENDENT_AMBULATORY_CARE_PROVIDER_SITE_OTHER): Payer: Medicare Other

## 2018-03-30 ENCOUNTER — Ambulatory Visit: Payer: Medicare Other

## 2018-03-30 DIAGNOSIS — J4550 Severe persistent asthma, uncomplicated: Secondary | ICD-10-CM

## 2018-03-30 MED ORDER — BENRALIZUMAB 30 MG/ML ~~LOC~~ SOSY
30.0000 mg | PREFILLED_SYRINGE | Freq: Once | SUBCUTANEOUS | Status: AC
  Administered 2018-03-30: 30 mg via SUBCUTANEOUS

## 2018-05-07 ENCOUNTER — Ambulatory Visit: Payer: Medicare Other | Admitting: Pulmonary Disease

## 2018-05-07 ENCOUNTER — Encounter: Payer: Self-pay | Admitting: Pulmonary Disease

## 2018-05-07 VITALS — BP 118/80 | HR 72 | Ht 64.0 in | Wt 175.0 lb

## 2018-05-07 DIAGNOSIS — J4551 Severe persistent asthma with (acute) exacerbation: Secondary | ICD-10-CM | POA: Diagnosis not present

## 2018-05-07 DIAGNOSIS — J449 Chronic obstructive pulmonary disease, unspecified: Secondary | ICD-10-CM | POA: Diagnosis not present

## 2018-05-07 DIAGNOSIS — J3089 Other allergic rhinitis: Secondary | ICD-10-CM

## 2018-05-07 DIAGNOSIS — J4489 Other specified chronic obstructive pulmonary disease: Secondary | ICD-10-CM

## 2018-05-07 DIAGNOSIS — J82 Pulmonary eosinophilia, not elsewhere classified: Secondary | ICD-10-CM

## 2018-05-07 DIAGNOSIS — J8283 Eosinophilic asthma: Secondary | ICD-10-CM

## 2018-05-07 MED ORDER — ROFLUMILAST 250 MCG PO TABS: 1.0000 | ORAL_TABLET | ORAL | 0 refills | Status: DC

## 2018-05-07 MED ORDER — ROFLUMILAST 500 MCG PO TABS: 500.0000 ug | ORAL_TABLET | Freq: Every day | ORAL | 5 refills | Status: DC

## 2018-05-07 NOTE — Patient Instructions (Signed)
Daliresp one pill daily  If you are doing okay with daliresp, then stop using theo-24  Follow up in 6 months

## 2018-05-07 NOTE — Progress Notes (Signed)
[facility]  Chief Complaint  Patient presents with  . Follow-up    pt is doing well overall. Pt is doing well with Fasnera.     Constitutional: BP 118/80 (BP Location: Left Arm, Cuff Size: Normal)   Pulse 72   Ht 5\' 4"  (1.626 m)   Wt 175 lb (79.4 kg)   SpO2 98%   BMI 30.04 kg/m   History of Present Illness: Mary [name] is a 67 y.o. female former smoker with COPD/asthma with eosinophilic phenotype, allergic rhinitis, and exercise induced bronchoconstriction.  She gets tight in her airways after using fasenra.  Last for a little while and then goes away.  Not having rash, or tongue/lip/throat swelling.  Has cough with clear sputum.  Hot weather is bad.  Not having wheeze, fever, sinus congestion, sore throat.  Feels like she gets around okay now that weather is better.  She wants alternative to theophylline.  This is too expensive.  She hasn't needed ABx or prednisone for a while.  Comprehensive Respiratory Exam:  Appearance - well kempt  ENMT - nasal mucosa moist, turbinates clear, midline nasal septum, no dental lesions, no gingival bleeding, no oral exudates, no tonsillar hypertrophy Neck - no masses, trachea midline, no thyromegaly, no elevation in JVP Respiratory - normal appearance of chest wall, normal respiratory effort w/o accessory muscle use, no dullness on percussion, no wheezing or rales, barrel chest CV - s1s2 regular rate and rhythm, no murmurs, no peripheral edema, radial pulses symmetric GI - soft, non tender, no masses Lymph - no adenopathy noted in neck and axillary areas MSK - normal muscle strength and tone, normal gait Ext - no cyanosis, clubbing, or joint inflammation noted Skin - no rashes, lesions, or ulcers Neuro - oriented to person, place, and time Psych - normal mood and affect   Assessment/Plan:  COPD with asthma and eosinophilic phenotype, and chronic bronchitis. - add daliresp and try to d/c  theophylline - continue spiriva, symbicort, singulair - prn albuterol - continue fasenra - prn mucinex  Allergic rhinitis. - flonase, singulair - continue fasenra   Patient Instructions  Daliresp one pill daily  If you are doing okay with daliresp, then stop using theo-24  Follow up in 6 months    Coralyn Helling, MD [facility] 05/07/2018, 9:44 AM  Flow Sheet  Pulmonary tests: RAST 02/28/08 >> IgE 327.6, multiple allergens PFT 02/28/08 >> FEV1 1.44(58%), FEV1% 48, TLC 5.25(99%), DLCO 76%  PFT 02/05/10 >> FEV1 1.44(60%), FEV1% 51, TLC 5.22(99%), DLCO 78%, +BD  Started xolair 08/14/08 >> stopped 09/10 (pt intolerant of medication) Spirometry 09/12/11 >> FEV1 1.28(60%), FEV1% 55 CT chest 04/18/14 >> centrilobular emphysema Spirometry 12/04/14 >> FEV1 1.22 (62%), FEV1% 52 RAST 02/03/17 >> dog, cat, dust mite, mold, tree bark, IgE 333 Spirometry 02/03/17 >> FEV1 1.2 (61%), FEV1% 54  Past Medical History: She  has a past medical history of Allergic rhinitis, Chronic obstructive asthma, Colon polyp, Esophageal stricture, Exercise-induced bronchoconstriction, GERD (gastroesophageal reflux disease), Hypothyroidism, Osteoporosis, and Pneumonia.  Past Surgical History: She  has a past surgical history that includes Esophageal dilation and Tubal ligation.  Family History: Her family history is positive for COPD.  Social History: She  reports that she quit smoking about 38 years ago. Her smoking use included cigarettes. She has a 10.00 pack-year smoking history. She has never used smokeless tobacco. She reports that she drinks about 1.0 standard drinks of alcohol per week. She reports that she does  not use drugs.  Medications: Allergies as of 05/07/2018   No Known Allergies     Medication List        Accurate as of 05/07/18  9:44 AM. Always use your most recent med list.          albuterol 108 (90 Base) MCG/ACT inhaler Commonly known as:  PROVENTIL  HFA;VENTOLIN HFA Inhale 2 puffs into the lungs every 6 (six) hours as needed.   albuterol (2.5 MG/3ML) 0.083% nebulizer solution Commonly known as:  PROVENTIL Take 3 mLs (2.5 mg total) by nebulization every 6 (six) hours as needed for wheezing or shortness of breath.   budesonide-formoterol 160-4.5 MCG/ACT inhaler Commonly known as:  SYMBICORT Inhale 2 puffs into the lungs 2 (two) times daily.   co-enzyme Q-10 30 MG capsule Take 30 mg by mouth daily.   CULTURELLE PO Take by mouth.   fluticasone 50 MCG/ACT nasal spray Commonly known as:  FLONASE Place 2 sprays into both nostrils daily.   guaiFENesin 600 MG 12 hr tablet Commonly known as:  MUCINEX Take 1,200 mg by mouth 2 (two) times daily.   levothyroxine 75 MCG tablet Commonly known as:  SYNTHROID, LEVOTHROID Take 1 tablet by mouth daily.   Magnesium 100 MG Caps Take by mouth.   montelukast 10 MG tablet Commonly known as:  SINGULAIR TAKE 1 TABLET BY MOUTH EVERY NIGHT AT BEDTIME   omeprazole 20 MG capsule Commonly known as:  PRILOSEC TAKE 1 TABLET BY MOUTH TWICE DAILY 30 MINUTES PRIOR TO FIRST AND LAST MEAL OF THE DAY   omeprazole 20 MG capsule Commonly known as:  PRILOSEC TAKE 1 CAPSULE BY MOUTH TWICE DAILY 30 MINUTES BEFORE FIRST AND LAST MEAL   predniSONE 10 MG tablet Commonly known as:  DELTASONE Take 2 tablets (20 mg total) daily with breakfast for the next 5 days.   raloxifene 60 MG tablet Commonly known as:  EVISTA Take 60 mg by mouth daily.   roflumilast 500 MCG Tabs tablet Commonly known as:  DALIRESP Take 1 tablet (500 mcg total) by mouth daily.   rOPINIRole 0.5 MG tablet Commonly known as:  REQUIP Take 0.5 mg by mouth at bedtime.   simvastatin 10 MG tablet Commonly known as:  ZOCOR Take 10 mg by mouth daily.   SPIRIVA HANDIHALER 18 MCG inhalation capsule Generic drug:  tiotropium INHALE 1 CAPSULE VIA INHALER EVERY DAY   THEO-24 100 MG 24 hr capsule Generic drug:  theophylline Take 1  capsule (100 mg total) by mouth daily.

## 2018-05-21 ENCOUNTER — Other Ambulatory Visit: Payer: Self-pay | Admitting: Pulmonary Disease

## 2018-05-21 ENCOUNTER — Telehealth: Payer: Self-pay | Admitting: Pulmonary Disease

## 2018-05-21 NOTE — Telephone Encounter (Signed)
1 prefilled syringe Ordered date: 05/21/18 Shipping date: 05/21/18

## 2018-05-22 NOTE — Telephone Encounter (Signed)
Arrival Date:05/22/18 Lot #: [id number] Exp date: 12/2019 

## 2018-05-25 ENCOUNTER — Ambulatory Visit (INDEPENDENT_AMBULATORY_CARE_PROVIDER_SITE_OTHER): Payer: Medicare Other

## 2018-05-25 DIAGNOSIS — J8283 Eosinophilic asthma: Secondary | ICD-10-CM

## 2018-05-25 DIAGNOSIS — J82 Pulmonary eosinophilia, not elsewhere classified: Secondary | ICD-10-CM

## 2018-05-25 MED ORDER — BENRALIZUMAB 30 MG/ML ~~LOC~~ SOSY
30.0000 mg | PREFILLED_SYRINGE | Freq: Once | SUBCUTANEOUS | Status: AC
  Administered 2018-05-25: 30 mg via SUBCUTANEOUS

## 2018-05-30 ENCOUNTER — Other Ambulatory Visit: Payer: Self-pay | Admitting: Pulmonary Disease

## 2018-07-10 ENCOUNTER — Other Ambulatory Visit (HOSPITAL_COMMUNITY): Payer: Self-pay | Admitting: *Deleted

## 2018-07-11 ENCOUNTER — Other Ambulatory Visit: Payer: Self-pay

## 2018-07-11 ENCOUNTER — Emergency Department (HOSPITAL_COMMUNITY): Payer: Medicare Other

## 2018-07-11 ENCOUNTER — Emergency Department (HOSPITAL_COMMUNITY)
Admission: EM | Admit: 2018-07-11 | Discharge: 2018-07-11 | Disposition: A | Discharge status: Home / Self Care | Payer: Medicare Other | Attending: Emergency Medicine | Admitting: Emergency Medicine

## 2018-07-11 ENCOUNTER — Ambulatory Visit (HOSPITAL_COMMUNITY)
Admission: RE | Admit: 2018-07-11 | Discharge: 2018-07-11 | Disposition: A | Discharge status: Home / Self Care | Payer: Medicare Other | Source: Ambulatory Visit | Attending: Obstetrics and Gynecology | Admitting: Obstetrics and Gynecology

## 2018-07-11 ENCOUNTER — Encounter (HOSPITAL_COMMUNITY): Payer: Self-pay

## 2018-07-11 DIAGNOSIS — R079 Chest pain, unspecified: Secondary | ICD-10-CM

## 2018-07-11 DIAGNOSIS — Z79899 Other long term (current) drug therapy: Secondary | ICD-10-CM | POA: Insufficient documentation

## 2018-07-11 DIAGNOSIS — E039 Hypothyroidism, unspecified: Secondary | ICD-10-CM | POA: Diagnosis not present

## 2018-07-11 DIAGNOSIS — J449 Chronic obstructive pulmonary disease, unspecified: Secondary | ICD-10-CM | POA: Diagnosis not present

## 2018-07-11 DIAGNOSIS — Z87891 Personal history of nicotine dependence: Secondary | ICD-10-CM | POA: Diagnosis not present

## 2018-07-11 DIAGNOSIS — M810 Age-related osteoporosis without current pathological fracture: Secondary | ICD-10-CM | POA: Insufficient documentation

## 2018-07-11 LAB — CBC
HCT: 39.1 % (ref 36.0–46.0)
Hemoglobin: 12.3 g/dL (ref 12.0–15.0)
MCH: 29.4 pg (ref 26.0–34.0)
MCHC: 31.5 g/dL (ref 30.0–36.0)
MCV: 93.3 fL (ref 80.0–100.0)
Platelets: 299 10*3/uL (ref 150–400)
RBC: 4.19 MIL/uL (ref 3.87–5.11)
RDW: 14.6 % (ref 11.5–15.5)
WBC: 11.3 10*3/uL — ABNORMAL HIGH (ref 4.0–10.5)
nRBC: 0 % (ref 0.0–0.2)

## 2018-07-11 LAB — I-STAT TROPONIN, ED
Troponin i, poc: 0 ng/mL (ref 0.00–0.08)
Troponin i, poc: 0 ng/mL (ref 0.00–0.08)

## 2018-07-11 LAB — BASIC METABOLIC PANEL
Anion gap: 8 (ref 5–15)
BUN: 14 mg/dL (ref 8–23)
CO2: 24 mmol/L (ref 22–32)
Calcium: 9.6 mg/dL (ref 8.9–10.3)
Chloride: 106 mmol/L (ref 98–111)
Creatinine, Ser: 0.82 mg/dL (ref 0.44–1.00)
GFR calc Af Amer: >60 mL/min (ref >60)
GFR calc non Af Amer: >60 mL/min (ref >60)
Glucose, Bld: 109 mg/dL — ABNORMAL HIGH (ref 70–99)
POTASSIUM: 3.9 mmol/L (ref 3.5–5.1)
Sodium: 138 mmol/L (ref 135–145)

## 2018-07-11 MED ORDER — ZOLEDRONIC ACID 5 MG/100ML IV SOLN
INTRAVENOUS | Status: AC
  Filled 2018-07-11: qty 100

## 2018-07-11 MED ORDER — ZOLEDRONIC ACID 5 MG/100ML IV SOLN
5.0000 mg | Freq: Once | INTRAVENOUS | Status: AC
  Administered 2018-07-11: 5 mg via INTRAVENOUS

## 2018-07-11 NOTE — ED Triage Notes (Signed)
Pt reports chest pain that started today after getting her annual infusion for osteoarthritis. The pain started to radiate to her right arm. No other symptoms.

## 2018-07-11 NOTE — ED Provider Notes (Signed)
[facility] EMERGENCY DEPARTMENT Provider Note   CSN: [id number] Arrival date & time: 07/11/18  1843     History   Chief Complaint Chief Complaint  Patient presents with  . Chest Pain    HPI Mary [name] is a 67 y.o. female.  The history is provided by the patient.  Chest Pain   This is a new problem. The current episode started 6 to 12 hours ago. The problem has been gradually improving. The pain is associated with rest. The pain is present in the lateral region. The pain is at a severity of 1/10. The pain is mild. The quality of the pain is described as sharp. The pain does not radiate. The symptoms are aggravated by certain positions. Pertinent negatives include no abdominal pain, no back pain, no claudication, no cough, no diaphoresis, no exertional chest pressure, no fever, no irregular heartbeat, no leg pain, no lower extremity edema, no malaise/fatigue, no nausea, no numbness, no palpitations, no PND, no shortness of breath, no sputum production, no syncope, no vomiting and no weakness. Treatments tried: massaging chest wall. The treatment provided moderate relief. Risk factors include being elderly.  Her past medical history is significant for COPD.  Pertinent negatives for past medical history include no diabetes, no hyperlipidemia, no hypertension, no PE and no seizures.  Pertinent negatives for family medical history include: no CAD.    Past Medical History:  Diagnosis Date  . Allergic rhinitis   . Chronic obstructive asthma   . Colon polyp   . Esophageal stricture   . Exercise-induced bronchoconstriction   . GERD (gastroesophageal reflux disease)   . Hypothyroidism   . Osteoporosis   . Pneumonia     Patient Active Problem List   Diagnosis Date Noted  . Upper airway cough syndrome 04/16/2014  . Cancer screening 04/16/2014  . Exercise-induced bronchoconstriction   . Esophageal stricture   . Colon polyp   . Hypothyroidism   . Atypical  chest pain 04/10/2013  . COPD with asthma (HCC) 01/30/2008  . Allergic rhinitis 05/04/2007    Past Surgical History:  Procedure Laterality Date  . ESOPHAGEAL DILATION    . TUBAL LIGATION       OB History   None      Home Medications    Prior to Admission medications   Medication Sig Start Date End Date Taking? Authorizing Provider  albuterol (PROAIR HFA) 108 (90 Base) MCG/ACT inhaler Inhale 2 puffs into the lungs every 6 (six) hours as needed. 02/02/18   Coral Ceo, NP  albuterol (PROVENTIL) (2.5 MG/3ML) 0.083% nebulizer solution Take 3 mLs (2.5 mg total) by nebulization every 6 (six) hours as needed for wheezing or shortness of breath. 02/02/18   Coral Ceo, NP  budesonide-formoterol (SYMBICORT) 160-4.5 MCG/ACT inhaler Inhale 2 puffs into the lungs 2 (two) times daily. 03/14/18   Coralyn Helling, MD  co-enzyme Q-10 30 MG capsule Take 30 mg by mouth daily.    [provider]  fluticasone (FLONASE) 50 MCG/ACT nasal spray Place 2 sprays into both nostrils daily. 03/06/18   Coralyn Helling, MD  guaiFENesin (MUCINEX) 600 MG 12 hr tablet Take 1,200 mg by mouth 2 (two) times daily.     [provider]  Lactobacillus Rhamnosus, GG, (CULTURELLE PO) Take by mouth.    [provider]  levothyroxine (SYNTHROID, LEVOTHROID) 75 MCG tablet Take 1 tablet by mouth daily. 08/04/15   [provider]  Magnesium 100 MG CAPS Take by mouth.  [provider]  montelukast (SINGULAIR) 10 MG tablet TAKE 1 TABLET BY MOUTH EVERY NIGHT AT BEDTIME 05/21/18   Coralyn Helling, MD  omeprazole (PRILOSEC) 20 MG capsule TAKE 1 TABLET BY MOUTH TWICE DAILY 30 MINUTES PRIOR TO FIRST AND LAST MEAL OF THE DAY 06/10/16   Coralyn Helling, MD  omeprazole (PRILOSEC) 20 MG capsule TAKE 1 CAPSULE BY MOUTH TWICE DAILY 30 MINUTES BEFORE FIRST AND LAST MEAL 11/29/17   Coralyn Helling, MD  predniSONE (DELTASONE) 10 MG tablet Take 2 tablets (20 mg total) daily with breakfast for the next 5 days. 02/02/18    Coral Ceo, NP  raloxifene (EVISTA) 60 MG tablet Take 60 mg by mouth daily.    [provider]  Roflumilast (DALIRESP) 250 MCG TABS Take 1 tablet by mouth 1 day or 1 dose for 1 dose. 05/07/18 05/08/18  Coralyn Helling, MD  roflumilast (DALIRESP) 500 MCG TABS tablet Take 1 tablet (500 mcg total) by mouth daily. 05/07/18   Coralyn Helling, MD  rOPINIRole (REQUIP) 0.5 MG tablet Take 0.5 mg by mouth at bedtime.    [provider]  simvastatin (ZOCOR) 10 MG tablet Take 10 mg by mouth daily.    [provider]  SPIRIVA HANDIHALER 18 MCG inhalation capsule INHALE 1 CAPSULE VIA INHALER EVERY DAY 12/19/17   Coralyn Helling, MD  THEO-24 100 MG 24 hr capsule Take 1 capsule (100 mg total) by mouth daily. 09/26/16   Coralyn Helling, MD    Family History No family history on file.  Social History Social History   Tobacco Use  . Smoking status: Former Smoker    Packs/day: 1.00    Years: 10.00    Pack years: 10.00    Types: Cigarettes    Last attempt to quit: 08/02/1979    Years since quitting: 38.9  . Smokeless tobacco: Never Used  Substance Use Topics  . Alcohol use: Yes    Alcohol/week: 1.0 standard drinks    Types: 1 Glasses of wine per week    Comment: occasionally  . Drug use: No     Allergies   Patient has no known allergies.   Review of Systems Review of Systems  Constitutional: Negative for chills, diaphoresis, fever and malaise/fatigue.  HENT: Negative for ear pain and sore throat.   Eyes: Negative for pain and visual disturbance.  Respiratory: Negative for cough, sputum production and shortness of breath.   Cardiovascular: Positive for chest pain. Negative for palpitations, claudication, syncope and PND.  Gastrointestinal: Negative for abdominal pain, nausea and vomiting.  Genitourinary: Negative for dysuria and hematuria.  Musculoskeletal: Positive for arthralgias. Negative for back pain.  Skin: Negative for color change and rash.  Neurological: Negative for  seizures, syncope, weakness and numbness.  All other systems reviewed and are negative.    Physical Exam Updated Vital Signs  ED Triage Vitals  Enc Vitals Group     BP 07/11/18 1857 123/67     Pulse Rate 07/11/18 1857 80     Resp 07/11/18 1857 14     Temp 07/11/18 1857 98.2 F (36.8 C)     Temp Source 07/11/18 1857 Oral     SpO2 07/11/18 1857 98 %     Weight --      Height --      Head Circumference --      Peak Flow --      Pain Score 07/11/18 1851 3     Pain Loc --      Pain  Edu? --      Excl. in GC? --     Physical Exam  Constitutional: She appears well-developed and well-nourished. No distress.  HENT:  Head: Normocephalic and atraumatic.  Eyes: Pupils are equal, round, and reactive to light. Conjunctivae and EOM are normal.  Neck: Normal range of motion. Neck supple.  Cardiovascular: Normal rate, regular rhythm, intact distal pulses and normal pulses.  No murmur heard. Pulmonary/Chest: Effort normal and breath sounds normal. No respiratory distress. She has no decreased breath sounds. She has no wheezes. She has no rhonchi. She has no rales.  Abdominal: Soft. There is no tenderness.  Musculoskeletal: She exhibits no edema.       Right lower leg: She exhibits no edema.       Left lower leg: She exhibits no edema.  TTP along right midaxillary line chest wall  Neurological: She is alert.  Skin: Skin is warm and dry. Capillary refill takes less than 2 seconds.  Psychiatric: She has a normal mood and affect.  Nursing note and vitals reviewed.    ED Treatments / Results  Labs (all labs ordered are listed, but only abnormal results are displayed) Labs Reviewed  BASIC METABOLIC PANEL - Abnormal; Notable for the following components:      Result Value   Glucose, Bld 109 (*)    All other components within normal limits  CBC - Abnormal; Notable for the following components:   WBC 11.3 (*)    All other components within normal limits  I-STAT TROPONIN, ED  I-STAT  TROPONIN, ED    EKG EKG Interpretation  Date/Time:  Wednesday July 11 2018 19:57:01 EST Ventricular Rate:  65 PR Interval:    QRS Duration: 91 QT Interval:  423 QTC Calculation: 440 R Axis:   -14 Text Interpretation:  Sinus rhythm No significant change since last tracing Confirmed by Virgina NorfolkAdam, Lamarkus Nebel [phone]([id number]) on 07/11/2018 8:01:23 PM   Radiology Dg Chest 2 View  Result Date: 07/11/2018 CLINICAL DATA:  Chest pain. EXAM: CHEST - 2 VIEW COMPARISON:  02/03/2017 FINDINGS: Mild cardiac enlargement. No pleural effusion or edema. Lungs are hyperinflated but clear. No airspace densities. The visualized osseous structures are remarkable for mild multi level thoracic spondylosis. IMPRESSION: 1. No acute cardiopulmonary abnormalities. Electronically Signed   By: Signa Kellaylor  Stroud M.D.   On: 07/11/2018 19:36    Procedures Procedures (including critical care time)  Medications Ordered in ED Medications - No data to display   Initial Impression / Assessment and Plan / ED Course  I have reviewed the triage vital signs and the nursing notes.  Pertinent labs & imaging results that were available during my care of the patient were reviewed by me and considered in my medical decision making (see chart for details).     Mary [name] is a 67 year old female with history of COPD, osteoporosis who presents to the ED with chest wall pain.  Patient with normal vitals.  No fever.  Patient with chest wall spasm earlier this afternoon that resolved with massage.  Patient has some tenderness over the right mid axillary line consistent with muscle spasm.  Patient had EKG that showed sinus rhythm.  No signs of ischemic changes.  Unchanged from prior EKGs.  Troponin negative x2.  No significant electrolyte abnormality, kidney injury, leukocytosis, anemia.  Chest x-ray without any signs of pneumonia, pneumothorax, pleural effusion.  Patient with mostly MSK pain, no concern for ACS.  No concern for PE.   Recommend continued use of Tylenol, Motrin  for pain.  Recommend follow-up with primary care doctor and discharged from ED in good condition.  Given return precautions.  This chart was dictated using voice recognition software.  Despite best efforts to proofread,  errors can occur which can change the documentation meaning.   Final Clinical Impressions(s) / ED Diagnoses   Final diagnoses:  Chest pain, unspecified type    ED Discharge Orders    None       Virgina Norfolk, DO 07/11/18 2218

## 2018-07-16 ENCOUNTER — Telehealth: Payer: Self-pay | Admitting: Pulmonary Disease

## 2018-07-16 NOTE — Telephone Encounter (Signed)
1 prefilled syringe Ordered date: 07/16/18 Shipping date: 07/16/18  

## 2018-07-17 NOTE — Telephone Encounter (Signed)
Arrival Date:07/17/18 Lot #: [id number] Exp date: 03/2020 

## 2018-07-20 ENCOUNTER — Ambulatory Visit: Payer: Medicare Other

## 2018-07-27 ENCOUNTER — Ambulatory Visit (INDEPENDENT_AMBULATORY_CARE_PROVIDER_SITE_OTHER): Payer: Medicare Other

## 2018-07-27 DIAGNOSIS — J82 Pulmonary eosinophilia, not elsewhere classified: Secondary | ICD-10-CM

## 2018-07-27 DIAGNOSIS — J8283 Eosinophilic asthma: Secondary | ICD-10-CM

## 2018-07-27 MED ORDER — BENRALIZUMAB 30 MG/ML ~~LOC~~ SOSY
30.0000 mg | PREFILLED_SYRINGE | Freq: Once | SUBCUTANEOUS | Status: AC
  Administered 2018-07-27: 30 mg via SUBCUTANEOUS

## 2018-08-14 ENCOUNTER — Telehealth: Payer: Self-pay | Admitting: Pulmonary Disease

## 2018-08-14 NOTE — Telephone Encounter (Signed)
Called patient at numbers provided. Unable to reach phone went to fax machine. Called x2. Will call back.

## 2018-08-16 MED ORDER — ROFLUMILAST 500 MCG PO TABS: 500.0000 ug | ORAL_TABLET | Freq: Every day | ORAL | 5 refills | Status: DC

## 2018-08-16 NOTE — Telephone Encounter (Signed)
[facility]  Called pt and her phone rings normal and then like a fax machine  Will try again later

## 2018-08-20 NOTE — Telephone Encounter (Signed)
Called patient, unable to reach with no option to leave voicemail. Phone rings once then goes to a fax line.

## 2018-08-21 NOTE — Telephone Encounter (Signed)
Unable to reach patient phone line goes to fax tone. Unable to leave voicemail.

## 2018-08-22 NOTE — Telephone Encounter (Signed)
LMTCB  Call made to pharmacy, per pharmacy staff patient has not picked up medication. Insurance is not covering and will need a PA. Pharmacy to fax a PA form to office.   PA initiated [id number]. Please allow 24-48 hours.

## 2018-08-23 NOTE — Telephone Encounter (Signed)
Medication name and strength: Daliresp 500mg  Provider: VS Pharmacy: [facility] [address] [place], [place] Patient insurance ID: [id number] Phone: [phone] Fax: [phone]  Was the PA started on CMM?  Today  If yes, please enter the Key: [facility] [id number]  Timeframe for approval/denial: 72 hrs for determination  Routing message to myself to f/u

## 2018-08-23 NOTE — Telephone Encounter (Signed)
Received paper work from [employer] has been approved through 08/01/2019. Call made to pharmacy to make aware. Will send paper work to be scanned into chart. Nothing further is needed at this time. Left message with patient making her aware.

## 2018-09-10 ENCOUNTER — Telehealth: Payer: Self-pay | Admitting: Pulmonary Disease

## 2018-09-10 NOTE — Telephone Encounter (Signed)
1 prefilled syringe Ordered date: 2.10.2020 Shipping date: 2.12.2020   

## 2018-09-13 NOTE — Telephone Encounter (Signed)
Arrival Date: 09/13/2018 Lot #: [id number] Exp date: 04/2020 

## 2018-09-21 ENCOUNTER — Ambulatory Visit: Payer: Medicare Other

## 2018-09-24 ENCOUNTER — Ambulatory Visit (INDEPENDENT_AMBULATORY_CARE_PROVIDER_SITE_OTHER): Payer: Medicare Other

## 2018-09-24 DIAGNOSIS — J4551 Severe persistent asthma with (acute) exacerbation: Secondary | ICD-10-CM | POA: Diagnosis not present

## 2018-09-28 MED ORDER — BENRALIZUMAB 30 MG/ML ~~LOC~~ SOSY
30.0000 mg | PREFILLED_SYRINGE | Freq: Once | SUBCUTANEOUS | Status: AC
  Administered 2018-09-24: 30 mg via SUBCUTANEOUS

## 2018-10-05 ENCOUNTER — Other Ambulatory Visit: Payer: Self-pay | Admitting: Pulmonary Disease

## 2018-11-15 ENCOUNTER — Telehealth: Payer: Self-pay | Admitting: Pulmonary Disease

## 2018-11-15 NOTE — Telephone Encounter (Signed)
Fasenra Order: 30mg #1 prefilled syringe Ordered date: 11/15/2018 Expected shipping date form pharmacy: 11/16/2018 Ordered by: Lindsay Lemons, CMA  

## 2018-11-16 NOTE — Telephone Encounter (Signed)
Fasenra Shipment Received:  30mg #1 prefilled syringe Medication arrival Date:11/16/2018 Lot #: [id number] Medication exp date: 01/2020 Received by: Lindsay Lemons, CMA   

## 2018-11-19 ENCOUNTER — Ambulatory Visit: Payer: Medicare Other

## 2018-11-22 ENCOUNTER — Other Ambulatory Visit: Payer: Self-pay | Admitting: Pulmonary Disease

## 2018-11-26 ENCOUNTER — Telehealth: Payer: Self-pay | Admitting: *Deleted

## 2018-11-26 NOTE — Telephone Encounter (Signed)
Pt called back, rescheduled her appt. For 11/28/2018 at 1:30. Nothing further needed.

## 2018-11-26 NOTE — Telephone Encounter (Signed)
Called pt to find out when she can rsc her fasenra inj.. Lmomtcb

## 2018-11-28 ENCOUNTER — Ambulatory Visit (INDEPENDENT_AMBULATORY_CARE_PROVIDER_SITE_OTHER): Payer: Medicare Other

## 2018-11-28 ENCOUNTER — Other Ambulatory Visit: Payer: Self-pay

## 2018-11-28 DIAGNOSIS — J4551 Severe persistent asthma with (acute) exacerbation: Secondary | ICD-10-CM

## 2018-11-28 MED ORDER — BENRALIZUMAB 30 MG/ML ~~LOC~~ SOSY
30.0000 mg | PREFILLED_SYRINGE | Freq: Once | SUBCUTANEOUS | Status: AC
  Administered 2018-11-28: 30 mg via SUBCUTANEOUS

## 2018-11-28 NOTE — Progress Notes (Signed)
Have you been hospitalized within the last 10 days?   Do you have a fever?  No Do you have a cough?  No Do you have a headache or sore throat? No

## 2018-12-05 ENCOUNTER — Encounter: Payer: Self-pay | Admitting: Primary Care

## 2018-12-28 ENCOUNTER — Other Ambulatory Visit: Payer: Self-pay | Admitting: Pulmonary Disease

## 2019-01-10 ENCOUNTER — Encounter: Payer: Self-pay | Admitting: Primary Care

## 2019-01-10 ENCOUNTER — Ambulatory Visit (INDEPENDENT_AMBULATORY_CARE_PROVIDER_SITE_OTHER): Payer: Medicare Other | Admitting: Primary Care

## 2019-01-10 ENCOUNTER — Other Ambulatory Visit: Payer: Self-pay | Admitting: Family Medicine

## 2019-01-10 ENCOUNTER — Other Ambulatory Visit: Payer: Self-pay

## 2019-01-10 ENCOUNTER — Telehealth: Payer: Self-pay

## 2019-01-10 ENCOUNTER — Telehealth: Payer: Self-pay | Admitting: Pulmonary Disease

## 2019-01-10 DIAGNOSIS — R0602 Shortness of breath: Secondary | ICD-10-CM | POA: Diagnosis not present

## 2019-01-10 DIAGNOSIS — Z1231 Encounter for screening mammogram for malignant neoplasm of breast: Secondary | ICD-10-CM

## 2019-01-10 NOTE — Telephone Encounter (Addendum)
lmtcb x1 pt.  Xray from PCP showed no active disease.  It did show kyphosis and spondylosis.  Recommend f/u with pcp for this as it could be causing her back pain.  May need referral to ortho.  Still want her to get d-dimer drawn.

## 2019-01-10 NOTE — Telephone Encounter (Signed)
pulm

## 2019-01-10 NOTE — Patient Instructions (Addendum)
Symptoms not consistent with asthma exacerbation   CXR from PCP on 6/8 showed no active disease, however, it did show mild thoracic kyphosis and spondylosis  Follow up with PCP regarding back pain- may need to see specialist  Labs- recommend checking D-dimer, if elevated will order CTA to rule out PE  Follow-up if breathing symptoms do not improve

## 2019-01-10 NOTE — Telephone Encounter (Signed)
Primary Pulmonologist: VS Last office visit and with whom: 05/07/18 with VS What do we see them for (pulmonary problems): COPD with asthma Last OV assessment/plan: Assessment/Plan:  COPD with asthma and eosinophilic phenotype, and chronic bronchitis. - add daliresp and try to d/c theophylline - continue spiriva, symbicort, singulair - prn albuterol - continue fasenra - prn mucinex  Allergic rhinitis. - flonase, singulair - continue fasenra   Patient Instructions  Daliresp one pill daily  If you are doing okay with daliresp, then stop using theo-24  Follow up in 6 months   Was appointment offered to patient (explain)?  appt scheduled   Reason for call: Called and spoke with pt who stated for the past two to three weeks she has had complaints of fatigue, right side pain of upper back, and worsening breathing.  Pt has had to use rescue inhaler at least twice a day and has had to do at least one neb treatment in a single day.  Pt is still using her symbicort, spiriva, and singulair as prescribed. Pt states her breathing is more labored breathing.   Pt states she has an occ dry cough.  Stated to pt that we should get her scheduled for a visit to further address symptoms and pt verbalized understanding. televisit scheduled for pt today at 11am with Derl Barrow, NP. Nothing further needed.

## 2019-01-10 NOTE — Telephone Encounter (Signed)
Pt is calling back [phone]

## 2019-01-10 NOTE — Telephone Encounter (Signed)
Spoke with pt and relayed results and recommendations.  Nothing further is needed.

## 2019-01-10 NOTE — Progress Notes (Signed)
Virtual Visit via Telephone Note  I connected with Mary [name] on 01/10/19 at 11:00 AM EDT by telephone and verified that I am speaking with the correct person using two identifiers.  Location: Patient: Home Provider: Office   I discussed the limitations, risks, security and privacy concerns of performing an evaluation and management service by telephone and the availability of in person appointments. I also discussed with the patient that there may be a patient responsible charge related to this service. The patient expressed understanding and agreed to proceed.   History of Present Illness: 68 year old female, former smoker. PMH significant for COPD with eosinophilic asthma, chronic bronchitis, allergic rhinitis. Patient of Dr. Halford Chessman, last seen on 05/07/18. Started on Daliresp during last office visit. Maintained on Symbicort, Spiriva and fasenra.   01/10/2019 Patient called today for an acute visit with complaints of shortness of breath x 2-3 weeks. Associated back pain and fatigue. Feels her asthma is actually better. Experiencing more labored breathing. Right back and chest wall discomfort. She does not have a constant cough. Continues Symbicort and spiriva as prescribed. Last Fasenra injection end of April. Takes mucinex daily to keep from being congested. States that as long as she exercised her breathing was ok. States that it keeps her functioning. Prior to Drain she was going to the gym/bikes/weights. Goal now walking 4-5 miles day. Constantly waking up at night, states that she can not get comfortable in bed to sleep. Not snoring or waking up short of breath. Saw PCP on Monday for annual exam, states that she had a CXR and labs this week. Denies shortness of breath or wheezing.   Observations/Objective:  - No shortness of breath, wheezing or cough observed during phone conversation   Testing: RAST 02/03/17 >> dog, cat, dust mite, mold, tree bark, IgE 333 Spirometry 02/03/17 >> FEV1  1.2 (61%), FEV1% 54  Assessment and Plan:  Shortness of breath/labored breathing  - Not consistent with acute asthma exacerbation - CXR from PCP on 6/8 showed no active disease, however, it did show mild thoracic kyphosis and spondylosis - Checking D-dimer  - Encouraged patient to stay active and return to exercise routine - May need to follow up with cardiology for additional testing   Follow Up Instructions:  - If symptoms do not improve or worsen  I discussed the assessment and treatment plan with the patient. The patient was provided an opportunity to ask questions and all were answered. The patient agreed with the plan and demonstrated an understanding of the instructions.   The patient was advised to call back or seek an in-person evaluation if the symptoms worsen or if the condition fails to improve as anticipated.  I provided 22 minutes of non-face-to-face time during this encounter.   Martyn Ehrich, NP

## 2019-01-11 ENCOUNTER — Other Ambulatory Visit: Payer: Medicare Other

## 2019-01-11 DIAGNOSIS — R0602 Shortness of breath: Secondary | ICD-10-CM

## 2019-01-11 LAB — D-DIMER, QUANTITATIVE (NOT AT ARMC): D-Dimer, Quant: 0.19 mcg/mL FEU (ref <0.50)

## 2019-01-15 ENCOUNTER — Telehealth: Payer: Self-pay | Admitting: Pulmonary Disease

## 2019-01-15 NOTE — Telephone Encounter (Signed)
Berna Bue Order: 30mg  #1 prefilled syringe Ordered date: 01/15/2019 Expected date of arrival: 01/16/2019 Ordered by: Desmond Dike, [place]

## 2019-01-16 NOTE — Telephone Encounter (Signed)
Fasenra Shipment Received:  30mg  #1 prefilled syringe Medication arrival date: 01/16/2019 Lot #: [id number] Exp date: 01/2020 Received by: Desmond Dike, [place]

## 2019-01-23 ENCOUNTER — Ambulatory Visit (INDEPENDENT_AMBULATORY_CARE_PROVIDER_SITE_OTHER): Payer: Medicare Other

## 2019-01-23 ENCOUNTER — Other Ambulatory Visit: Payer: Self-pay

## 2019-01-23 DIAGNOSIS — J4551 Severe persistent asthma with (acute) exacerbation: Secondary | ICD-10-CM | POA: Diagnosis not present

## 2019-01-23 MED ORDER — BENRALIZUMAB 30 MG/ML ~~LOC~~ SOSY
30.0000 mg | PREFILLED_SYRINGE | Freq: Once | SUBCUTANEOUS | Status: AC
  Administered 2019-01-23: 30 mg via SUBCUTANEOUS

## 2019-01-23 NOTE — Progress Notes (Signed)
Have you been hospitalized within the last 10 days?  No Do you have a fever?  No Do you have a cough?  Yes Do you have a headache or sore throat? No  

## 2019-02-04 ENCOUNTER — Telehealth: Payer: Self-pay | Admitting: Pulmonary Disease

## 2019-02-04 ENCOUNTER — Ambulatory Visit: Payer: Medicare Other

## 2019-02-04 DIAGNOSIS — R0602 Shortness of breath: Secondary | ICD-10-CM

## 2019-02-04 NOTE — Telephone Encounter (Signed)
Pt's last Fasenra injection was 01/23/2019.  Called and spoke with pt letting her know that Beth wanted her to come by office to get labwork done and also for [place] to schedule televisit for her to further evaluate. Pt verbalized understanding. Pt stated she would try to come by office this afternoon if not today then tomorrow to get labwork done. Pt has been scheduled for televisit with Derl Barrow, NP Thurs. 7/9 at 9am. Nothing further needed.

## 2019-02-04 NOTE — Telephone Encounter (Signed)
Records received from patient's PCP Called patient to make her aware these were received  Since patient had televisit with [facility] NP last, will route message to [facility] NP to advise on spirometry results and if televisit or face-to-face visit is preferred.  Thank you.

## 2019-02-04 NOTE — Telephone Encounter (Signed)
Primary Pulmonologist: Sood Last office visit and with whom: 6.11.2020 telephone w/ Beth NP What do we see them for (pulmonary problems): COPD w/ Asthma  Reason for call: called spoke with patient  Spirometry w/ PCP done 6.8.2020 and just recently received the results - was informed her score is "low - at about 70" and was recommended to follow up with pulmonology.  Dyspnea is unchanged since telephone visit with [facility] NP.  Still taking Symbicort, Spiriva and Daliresp daily with nebs "when it gets uncomfortable."  There is no copy of spirometry results in patient's chart.  Will call PCP for copy of spirometry to be sent to this office.  Advised patient will call her once results are received and we will route message to [facility] NP (whom had visit with patient last) or VS who is in the office on this Thursday and Friday (if it takes that long for the fax to be received).  Patient is okay with waiting, stating that she is not having any acute symptoms and called because she was recommended to follow up with this office.  Called PCP's office [phone] and spoke with Jermica in medical records - spirometry to be faxed to [place] at [phone].  Will keep message in triage and await results.  Will change message status from priority to routine.

## 2019-02-04 NOTE — Telephone Encounter (Signed)
When was patients last fasenra injection? Please have patient get labs and then scheduled televisit later this week

## 2019-02-05 LAB — CBC WITH DIFFERENTIAL/PLATELET
Basophils Absolute: 0.1 10*3/uL (ref 0.0–0.1)
Basophils Relative: 0.5 % (ref 0.0–3.0)
Eosinophils Absolute: 0 10*3/uL (ref 0.0–0.7)
Eosinophils Relative: 0.1 % (ref 0.0–5.0)
HCT: 38.4 % (ref 36.0–46.0)
Hemoglobin: 12.7 g/dL (ref 12.0–15.0)
Lymphocytes Relative: 36.8 % (ref 12.0–46.0)
Lymphs Abs: 3.6 10*3/uL (ref 0.7–4.0)
MCHC: 33.1 g/dL (ref 30.0–36.0)
MCV: 94.1 fl (ref 78.0–100.0)
Monocytes Absolute: 0.7 10*3/uL (ref 0.1–1.0)
Monocytes Relative: 7.2 % (ref 3.0–12.0)
Neutro Abs: 5.4 10*3/uL (ref 1.4–7.7)
Neutrophils Relative %: 55.4 % (ref 43.0–77.0)
Platelets: 329 10*3/uL (ref 150.0–400.0)
RBC: 4.08 Mil/uL (ref 3.87–5.11)
RDW: 13.8 % (ref 11.5–15.5)
WBC: 9.7 10*3/uL (ref 4.0–10.5)

## 2019-02-05 LAB — BRAIN NATRIURETIC PEPTIDE: Pro B Natriuretic peptide (BNP): 10 pg/mL (ref 0.0–100.0)

## 2019-02-05 LAB — IGE: IgE (Immunoglobulin E), Serum: 344 kU/L — ABNORMAL HIGH (ref <=114)

## 2019-02-07 ENCOUNTER — Ambulatory Visit: Payer: Medicare Other | Admitting: Primary Care

## 2019-02-08 ENCOUNTER — Other Ambulatory Visit: Payer: Self-pay

## 2019-02-08 ENCOUNTER — Ambulatory Visit (INDEPENDENT_AMBULATORY_CARE_PROVIDER_SITE_OTHER): Payer: Medicare Other | Admitting: Primary Care

## 2019-02-08 ENCOUNTER — Encounter: Payer: Self-pay | Admitting: Primary Care

## 2019-02-08 DIAGNOSIS — J449 Chronic obstructive pulmonary disease, unspecified: Secondary | ICD-10-CM | POA: Diagnosis not present

## 2019-02-08 MED ORDER — PREDNISONE 10 MG PO TABS: ORAL_TABLET | ORAL | 0 refills | Status: DC

## 2019-02-08 NOTE — Patient Instructions (Addendum)
-   RX low dose prednisone taper (10mg  x 1 week; 5 mg x 1 week)  - Continue Symbicort twice daily and Spiriva daily  - Continue Fasenra injections every 2 months   - Discussed with Dr. Halford Chessman, agreeing with above recommendations (if no improvement with prednisone needs further cardiac work up)  - Follow up in 3 months  25

## 2019-02-08 NOTE — Progress Notes (Signed)
Virtual Visit via Telephone Note  I connected with Mary [name] on 02/08/19 at 11:00 AM EDT by telephone and verified that I am speaking with the correct person using two identifiers.  Location: Patient: Home Provider: Office   I discussed the limitations, risks, security and privacy concerns of performing an evaluation and management service by telephone and the availability of in person appointments. I also discussed with the patient that there may be a patient responsible charge related to this service. The patient expressed understanding and agreed to proceed.   History of Present Illness: 68 year old female, former smoker. PMH significant for COPD with eosinophilic asthma, chronic bronchitis, allergic rhinitis. Patient of Dr. Halford Chessman, last seen on 05/07/18. Started on Daliresp during last office visit. Maintained on Symbicort, Spiriva. She has been getting fasenra injections since sept 2018.   Previous [place] encounters: 01/10/2019 Patient called today for an acute visit with complaints of shortness of breath x 2-3 weeks. Associated back pain and fatigue. Feels her asthma is actually better. Experiencing more labored breathing. Right back and chest wall discomfort. She does not have a constant cough. Continues Symbicort and spiriva as prescribed. Last Fasenra injection end of April. Takes mucinex daily to keep from being congested. States that as long as she exercised her breathing was ok. States that it keeps her functioning. Prior to [place] she was going to the gym/bikes/weights. Goal now walking 4-5 miles day. Constantly waking up at night, states that she can not get comfortable in bed to sleep. Not snoring or waking up short of breath. Saw PCP on Monday for annual exam, states that she had a CXR and labs this week. Denies shortness of breath or wheezing.   02/08/2019 Patient called today for follow-up visit. Patient called 7/6 with shortness of breath. States that the breathing symptoms  she was experiencing were not her typical shortness of breath symptoms. Described as more labored breathing. Some wheezing. Using nebulizer helps. Last fasenra injection on 01/22/19. Maintained on Symbicort, Spiriva and daliresp. Labs showed improvement in Eosinophils. She is very reluctant to take prednisone. States that she is seeing Urology on 7/20 for kidney stone which could likely have been causing her back pain.    Observations/Objective: - No significant shortness of breath, wheezing or cough noted during phone conversation  Significant testing reviewed: 02/04/19- IgE 344 02/04/19- BNP 10 02/04/19- Eos absolute 0 (0.6 - 2 years ago) 01/11/19- Ddimer negative  02/03/17 - RAST>> dog, cat, dust mite, mold, tree bark, IgE 333  02/03/17 Spirometry >> FEV1 1.2 (61%), FEV1% 54  Assessment and Plan: Patient continues to have mild shortness of breath and wheezing symptoms. She is relucant to take prednisone but has agreed to try low dose. If symptoms do not improve with oral steriod recommend further cardiac work up.   COPD with eosinophilic asthma - Eos absolute 0; IGE 344, BNP normal - RX low dose prednisone taper (10mg  x 1 week; 5 mg x 1 week) - Continue Symbicort 160 2 bid and Spiriva (could consider changing to Pulmicort and brovana if needed in the future) - Continue Fasenra 30mg  q 2 months (changing to Xolair could be an option but not recommended at this time) - Discussed with Dr. Halford Chessman, agreeing with recommendations  Follow Up Instructions:  FU in 3 months    I discussed the assessment and treatment plan with the patient. The patient was provided an opportunity to ask questions and all were answered. The patient agreed with the plan and demonstrated  an understanding of the instructions.   The patient was advised to call back or seek an in-person evaluation if the symptoms worsen or if the condition fails to improve as anticipated.  I provided 25 minutes of non-face-to-face time during  this encounter.   Glenford BayleyElizabeth W Bliss Tsang, NP

## 2019-02-13 ENCOUNTER — Telehealth: Payer: Self-pay | Admitting: *Deleted

## 2019-02-25 ENCOUNTER — Other Ambulatory Visit: Payer: Self-pay

## 2019-02-25 ENCOUNTER — Ambulatory Visit
Admission: RE | Admit: 2019-02-25 | Discharge: 2019-02-25 | Disposition: A | Discharge status: Home / Self Care | Payer: Medicare Other | Source: Ambulatory Visit | Attending: Family Medicine | Admitting: Family Medicine

## 2019-02-25 DIAGNOSIS — Z1231 Encounter for screening mammogram for malignant neoplasm of breast: Secondary | ICD-10-CM

## 2019-03-11 ENCOUNTER — Telehealth: Payer: Self-pay | Admitting: Pulmonary Disease

## 2019-03-11 NOTE — Telephone Encounter (Signed)
Berna Bue Order: 30mg  #1 prefilled syringe Ordered date: 03/11/19 Expected date of arrival: 03/12/19 Ordered by: Tonna Corner [facility]

## 2019-03-12 NOTE — Telephone Encounter (Signed)
Fasenra Shipment Received:  30mg  #1 prefilled syringe Medication arrival date: 03/12/19 Lot #: [id number] Exp date: 05/01/2020 Received by: Tonna Corner

## 2019-03-15 ENCOUNTER — Encounter: Payer: Self-pay | Admitting: Pulmonary Disease

## 2019-03-15 ENCOUNTER — Other Ambulatory Visit: Payer: Self-pay

## 2019-03-15 ENCOUNTER — Ambulatory Visit (INDEPENDENT_AMBULATORY_CARE_PROVIDER_SITE_OTHER): Payer: Medicare Other | Admitting: Pulmonary Disease

## 2019-03-15 VITALS — BP 110/70 | HR 66 | Ht 65.0 in | Wt 179.0 lb

## 2019-03-15 DIAGNOSIS — J8283 Eosinophilic asthma: Secondary | ICD-10-CM

## 2019-03-15 DIAGNOSIS — J3089 Other allergic rhinitis: Secondary | ICD-10-CM | POA: Diagnosis not present

## 2019-03-15 DIAGNOSIS — J449 Chronic obstructive pulmonary disease, unspecified: Secondary | ICD-10-CM

## 2019-03-15 DIAGNOSIS — J82 Pulmonary eosinophilia, not elsewhere classified: Secondary | ICD-10-CM

## 2019-03-15 NOTE — Progress Notes (Signed)
[facility]  Chief Complaint  Patient presents with  . Follow-up    COPD with Asthma    Constitutional:  BP 110/70 (BP Location: Right Arm, Cuff Size: Normal)   Pulse 66   Ht 5\' 5"  (1.651 m)   Wt 179 lb (81.2 kg)   SpO2 98%   BMI 29.79 kg/m   Past Medical History:  PNA, Osteoporosis, Hypothyroidism, GERD, Esophageal stricture, Colon polyp  Brief Summary:  Mary [name] is a 68 y.o. female former smoker with COPD/asthma with eosinophilic phenotype, allergic rhinitis, and exercise induced bronchoconstriction.  She was changed from theophylline to daliresp several months ago.  She feels her breathing got worse after this change.  Didn't feel like she could get enough air in.  Wasn't having cough, wheeze, sputum, or chest congestion.  She has been seen by cardiology.  She changed back to theophylline two days ago.  Her breathing has improved.    Physical Exam:   Appearance - well kempt   ENMT - clear nasal mucosa, midline nasal  septum, no oral exudates, no LAN, trachea midline  Respiratory - normal chest wall, normal respiratory effort, no accessory muscle use, no wheeze/rales  CV - s1s2 regular rate and rhythm, no murmurs, no peripheral edema, radial pulses symmetric  GI - soft, non tender, no masses  Lymph - no adenopathy noted in neck and axillary areas  MSK - normal gait  Ext - no cyanosis, clubbing, or joint inflammation noted  Skin - no rashes, lesions, or ulcers  Neuro - normal strength, oriented x 3  Psych - normal mood and affect   Assessment/Plan:   COPD with asthma and eosinophilic phenotype, and chronic bronchitis. - seems like theophylline is better option for her compared to daliresp; will continue theo 24 and she will call with dose - continue spiriva, symbicort, singulair - continue fasenra - prn albuterol, mucinex - she will get flu and pneumovax vaccinations after cardiology assessment  complete  Allergic rhinitis. - flonase, singulair - continue fasenra  Patient Instructions  Call with the dose of your Theo-24  You will need to get a booster for pneumovax pneumonia vaccine  Follow up in 2 months    Coralyn HellingVineet Emarion Toral, MD [facility] Pager: [phone] 03/15/2019, 11:57 AM  Flow Sheet     Pulmonary tests:  RAST 02/28/08 >> IgE 327.6, multiple allergens PFT 02/28/08 >>FEV1 1.44(58%), FEV1% 48, TLC 5.25(99%), DLCO 76%  PFT 02/05/10 >>FEV1 1.44(60%), FEV1% 51, TLC 5.22(99%), DLCO 78%, +BD  Started xolair 08/14/08 >> stopped 09/10 (pt intolerant of medication) Spirometry 09/12/11 >> FEV1 1.28(60%), FEV1% 55 Spirometry 12/04/14 >> FEV1 1.22 (62%), FEV1% 52 RAST 02/03/17 >> dog, cat, dust mite, mold, tree bark, IgE 333 Spirometry 02/03/17 >> FEV1 1.2 (61%), FEV1% 54 IgE 02/04/19 >> 344  Chest imaging:  CT chest 04/18/14 >> centrilobular emphysema  Medications:   Allergies as of 03/15/2019   No Known Allergies     Medication List       Accurate as of March 15, 2019 11:57 AM. If you have any questions, ask your nurse or doctor.        STOP taking these medications   predniSONE 10 MG tablet Commonly known as: DELTASONE Stopped by: Coralyn HellingVineet Lindzey Zent, MD   Roflumilast 250 MCG Tabs Commonly known as: Daliresp Stopped by: Coralyn HellingVineet Zamora Colton, MD   roflumilast 500 MCG Tabs tablet Commonly known as: DALIRESP Stopped by: Coralyn HellingVineet Josilynn Losh, MD     TAKE these medications  albuterol 108 (90 Base) MCG/ACT inhaler Commonly known as: ProAir HFA Inhale 2 puffs into the lungs every 6 (six) hours as needed.   albuterol (2.5 MG/3ML) 0.083% nebulizer solution Commonly known as: PROVENTIL Take 3 mLs (2.5 mg total) by nebulization every 6 (six) hours as needed for wheezing or shortness of breath.   CULTURELLE PO Take by mouth.   Fasenra 30 MG/ML Sosy Generic drug: Benralizumab Inject 30 mg into the skin every 8 (eight) weeks.   fluticasone 50 MCG/ACT  nasal spray Commonly known as: FLONASE Place 2 sprays into both nostrils daily.   guaiFENesin 600 MG 12 hr tablet Commonly known as: MUCINEX Take 1,200 mg by mouth daily.   levothyroxine 75 MCG tablet Commonly known as: SYNTHROID Take 1 tablet by mouth daily.   Magnesium 100 MG Caps Take by mouth.   montelukast 10 MG tablet Commonly known as: SINGULAIR TAKE 1 TABLET BY MOUTH EVERY NIGHT AT BEDTIME   omeprazole 20 MG capsule Commonly known as: PRILOSEC TAKE 1 CAPSULE BY MOUTH TWICE DAILY 30 MINUTES BEFORE FIRST AND LAST MEAL   simvastatin 10 MG tablet Commonly known as: ZOCOR Take 10 mg by mouth daily.   Spiriva HandiHaler 18 MCG inhalation capsule Generic drug: tiotropium INHALE 1 CAPSULE VIA INHALER EVERY DAY   Symbicort 160-4.5 MCG/ACT inhaler Generic drug: budesonide-formoterol INHALE 2 PUFFS BY MOUTH TWICE DAILY       Past Surgical History:  She  has a past surgical history that includes Esophageal dilation and Tubal ligation.  Family History:  Her family history is not on file.  Social History:  She  reports that she quit smoking about 39 years ago. Her smoking use included cigarettes. She has a 10.00 pack-year smoking history. She has never used smokeless tobacco. She reports current alcohol use of about 1.0 standard drinks of alcohol per week. She reports that she does not use drugs.

## 2019-03-15 NOTE — Patient Instructions (Signed)
Call with the dose of your Theo-24  You will need to get a booster for pneumovax pneumonia vaccine  Follow up in 2 months

## 2019-03-19 ENCOUNTER — Telehealth: Payer: Self-pay | Admitting: Pulmonary Disease

## 2019-03-19 NOTE — Telephone Encounter (Signed)
Returned call to Patient.  Covid screen questions given.  All negative answers.  Patient stated she does have a Epipen, and was bringing to injection 03/20/19.  Nothing further at this time.

## 2019-03-19 NOTE — Telephone Encounter (Signed)
Called and spoke with pt. Pt said the med that needed to be added to her current medication list is Theo 24. I went back and reviewed pt's med history and saw that she had been on Theo 24 100mg . I have added this to pt's med list. Nothing further needed.

## 2019-03-20 ENCOUNTER — Other Ambulatory Visit: Payer: Self-pay

## 2019-03-20 ENCOUNTER — Ambulatory Visit (INDEPENDENT_AMBULATORY_CARE_PROVIDER_SITE_OTHER): Payer: Medicare Other

## 2019-03-20 DIAGNOSIS — J4550 Severe persistent asthma, uncomplicated: Secondary | ICD-10-CM

## 2019-03-20 MED ORDER — BENRALIZUMAB 30 MG/ML ~~LOC~~ SOSY
30.0000 mg | PREFILLED_SYRINGE | Freq: Once | SUBCUTANEOUS | Status: AC
  Administered 2019-03-20: 30 mg via SUBCUTANEOUS

## 2019-03-20 NOTE — Progress Notes (Signed)
Reviewed and agree with assessment/plan.   Linton Stolp, MD [facility] 07/27/2016, 12:24 PM Pager:  [phone]  

## 2019-03-20 NOTE — Progress Notes (Signed)
Have you been hospitalized within the last 10 days?  No Do you have a fever?  No Do you have a cough?  No Do you have a headache or sore throat? No Do you have your Epi Pen visible and is it within date?  Yes 

## 2019-03-23 NOTE — Progress Notes (Signed)
Reviewed and agree with assessment/plan.   Galadriel Shroff, MD [facility] 07/27/2016, 12:24 PM Pager:  [phone]  

## 2019-04-11 ENCOUNTER — Telehealth: Payer: Self-pay | Admitting: Pulmonary Disease

## 2019-04-11 NOTE — Telephone Encounter (Addendum)
Noted. Will call back later to inform her of the scheduled appts with [place]: 10/14 for injection and 10/16 with VS.

## 2019-04-15 NOTE — Telephone Encounter (Signed)
Spoke with pt. She is aware of her pending appointments with our office. Nothing further was needed.

## 2019-05-06 ENCOUNTER — Telehealth: Payer: Self-pay | Admitting: Pulmonary Disease

## 2019-05-06 NOTE — Telephone Encounter (Signed)
Fasenra Order: 30mg #1 prefilled syringe Ordered date: 05/06/19 Expected date of arrival: 05/07/19 Ordered by: Teirra Carapia,LPN [facility] 

## 2019-05-07 NOTE — Telephone Encounter (Signed)
Fasenra Shipment Received:  30mg #1 prefilled syringe Medication arrival date: 05/07/19 Lot #: [id number] Exp date: 06/01/2020 Received by: Hazelyn Kallen,LPN 

## 2019-05-15 ENCOUNTER — Other Ambulatory Visit: Payer: Self-pay

## 2019-05-15 ENCOUNTER — Ambulatory Visit (INDEPENDENT_AMBULATORY_CARE_PROVIDER_SITE_OTHER): Payer: Medicare Other

## 2019-05-15 DIAGNOSIS — J4550 Severe persistent asthma, uncomplicated: Secondary | ICD-10-CM | POA: Diagnosis not present

## 2019-05-15 MED ORDER — EPINEPHRINE 0.3 MG/0.3ML IJ SOAJ: 0.3000 mg | Freq: Once | INTRAMUSCULAR | 6 refills | Status: AC

## 2019-05-15 MED ORDER — BENRALIZUMAB 30 MG/ML ~~LOC~~ SOSY
30.0000 mg | PREFILLED_SYRINGE | Freq: Once | SUBCUTANEOUS | Status: AC
  Administered 2019-05-15: 30 mg via SUBCUTANEOUS

## 2019-05-15 NOTE — Progress Notes (Signed)
Have you been hospitalized within the last 10 days?  No Do you have a fever?  No Do you have a cough?  No Do you have a headache or sore throat? No Do you have your Epi Pen visible and is it within date?  Yes   New Epipen prescription sent to [facility] Per Patient request.

## 2019-05-17 ENCOUNTER — Other Ambulatory Visit: Payer: Self-pay

## 2019-05-17 ENCOUNTER — Ambulatory Visit: Payer: Medicare Other | Admitting: Pulmonary Disease

## 2019-05-17 ENCOUNTER — Encounter: Payer: Self-pay | Admitting: Pulmonary Disease

## 2019-05-17 VITALS — BP 116/70 | HR 74 | Temp 97.6°F | Ht 65.0 in | Wt 181.2 lb

## 2019-05-17 DIAGNOSIS — J3089 Other allergic rhinitis: Secondary | ICD-10-CM

## 2019-05-17 DIAGNOSIS — J449 Chronic obstructive pulmonary disease, unspecified: Secondary | ICD-10-CM | POA: Diagnosis not present

## 2019-05-17 MED ORDER — ALBUTEROL SULFATE HFA 108 (90 BASE) MCG/ACT IN AERS: 2.0000 | INHALATION_SPRAY | Freq: Four times a day (QID) | RESPIRATORY_TRACT | 3 refills | Status: DC | PRN

## 2019-05-17 MED ORDER — BUDESONIDE-FORMOTEROL FUMARATE 160-4.5 MCG/ACT IN AERO: 2.0000 | INHALATION_SPRAY | Freq: Two times a day (BID) | RESPIRATORY_TRACT | 3 refills | Status: DC

## 2019-05-17 MED ORDER — MONTELUKAST SODIUM 10 MG PO TABS: 10.0000 mg | ORAL_TABLET | Freq: Every day | ORAL | 3 refills | Status: DC

## 2019-05-17 MED ORDER — SPIRIVA HANDIHALER 18 MCG IN CAPS: ORAL_CAPSULE | RESPIRATORY_TRACT | 3 refills | Status: DC

## 2019-05-17 MED ORDER — THEOPHYLLINE ER 100 MG PO CP24: 100.0000 mg | ORAL_CAPSULE | Freq: Every day | ORAL | 3 refills | Status: DC

## 2019-05-17 NOTE — Patient Instructions (Signed)
Follow up in 6 months 

## 2019-05-17 NOTE — Addendum Note (Signed)
Addended by: Nena Polio on: 05/17/2019 10:42 AM   Modules accepted: Orders

## 2019-05-17 NOTE — Progress Notes (Signed)
[facility]  Chief Complaint  Patient presents with  . COPD with asthma    Doing better now that she is back to taking Theo -24    Constitutional:  BP 116/70 (BP Location: Left Arm, Patient Position: Sitting, Cuff Size: Normal)   Pulse 74   Temp 97.6 F (36.4 C)   Ht 5\' 5"  (1.651 m)   Wt 181 lb 3.2 oz (82.2 kg)   SpO2 98% Comment: on room air  BMI 30.15 kg/m   Past Medical History:  PNA, Osteoporosis, Hypothyroidism, GERD, Esophageal stricture, Colon polyp, Nephrolithiasis, b/l hydronephrosis  Brief Summary:  Mary [name] is a 68 y.o. female former smoker with COPD/asthma with eosinophilic phenotype, allergic rhinitis, and exercise induced bronchoconstriction.  She was switched back to theophylline from daliresp.  Breathing is much better.  Has intermittent cough with clear sputum.  Using mucinex and this helps.  Not having sinus congestion, sore throat, wheeze, chest pain, skin rash, or leg swelling.  Not needing to use albuterol much.  Was seen recently by urology for hydronephrosis.  Was advised only needed follow up and monitoring.   Physical Exam:   Appearance - well kempt   ENMT - no sinus tenderness, no nasal discharge, no oral exudate  Neck - no masses, trachea midline, no thyromegaly, no elevation in JVP  Respiratory - normal appearance of chest wall, normal respiratory effort w/o accessory muscle use, no dullness on percussion, no wheezing or rales  CV - s1s2 regular rate and rhythm, no murmurs, no peripheral edema, radial pulses symmetric  GI - soft, non tender  Lymph - no adenopathy noted in neck and axillary areas  MSK - normal gait, mild kyphosis  Ext - no cyanosis, clubbing, or joint inflammation noted  Skin - no rashes, lesions, or ulcers  Neuro - normal strength, oriented x 3  Psych - normal mood and affect    Assessment/Plan:   COPD with asthma and eosinophilic phenotype, and chronic  bronchitis. - intolerant of xolair in 2010 - started fasenra in September 2019 - daliresp ineffective - feels that theophylline is more beneficial; will continue this - continue spiriva, symbicort, singulair - prn mucinex, and albuterol  Allergic rhinitis. - continue flonase, singulair, fasenra   Patient Instructions  Follow up in 6 months   Chesley Mires, MD [facility] Pager: [phone] 05/17/2019, 10:25 AM  Flow Sheet     Pulmonary tests:  RAST 02/28/08 >> IgE 327.6, multiple allergens PFT 02/28/08 >>FEV1 1.44(58%), FEV1% 48, TLC 5.25(99%), DLCO 76%  PFT 02/05/10 >>FEV1 1.44(60%), FEV1% 51, TLC 5.22(99%), DLCO 78%, +BD  Started xolair 08/14/08 >> stopped 09/10 (pt intolerant of medication) Spirometry 09/12/11 >> FEV1 1.28(60%), FEV1% 55 Spirometry 12/04/14 >> FEV1 1.22 (62%), FEV1% 52 RAST 02/03/17 >> dog, cat, dust mite, mold, tree bark, IgE 333 Spirometry 02/03/17 >> FEV1 1.2 (61%), FEV1% 54 IgE 02/04/19 >> 344  Chest imaging:  CT chest 04/18/14 >> centrilobular emphysema  Medications:   Allergies as of 05/17/2019   No Known Allergies     Medication List       Accurate as of May 17, 2019 10:25 AM. If you have any questions, ask your nurse or doctor.        albuterol (2.5 MG/3ML) 0.083% nebulizer solution Commonly known as: PROVENTIL Take 3 mLs (2.5 mg total) by nebulization every 6 (six) hours as needed for wheezing or shortness of breath.   albuterol 108 (90 Base) MCG/ACT inhaler Commonly known  as: ProAir HFA Inhale 2 puffs into the lungs every 6 (six) hours as needed.   budesonide-formoterol 160-4.5 MCG/ACT inhaler Commonly known as: Symbicort Inhale 2 puffs into the lungs 2 (two) times daily.   CULTURELLE PO Take by mouth.   Fasenra 30 MG/ML Sosy Generic drug: Benralizumab Inject 30 mg into the skin every 8 (eight) weeks.   fluticasone 50 MCG/ACT nasal spray Commonly known as: FLONASE Place 2 sprays into both  nostrils daily.   guaiFENesin 600 MG 12 hr tablet Commonly known as: MUCINEX Take 1,200 mg by mouth daily.   levothyroxine 75 MCG tablet Commonly known as: SYNTHROID Take 1 tablet by mouth daily.   Magnesium 100 MG Caps Take by mouth.   montelukast 10 MG tablet Commonly known as: SINGULAIR Take 1 tablet (10 mg total) by mouth at bedtime.   omeprazole 20 MG capsule Commonly known as: PRILOSEC TAKE 1 CAPSULE BY MOUTH TWICE DAILY 30 MINUTES BEFORE FIRST AND LAST MEAL   simvastatin 10 MG tablet Commonly known as: ZOCOR Take 10 mg by mouth daily.   Spiriva HandiHaler 18 MCG inhalation capsule Generic drug: tiotropium INHALE 1 CAPSULE VIA INHALER EVERY DAY   theophylline 100 MG 24 hr capsule Commonly known as: THEO-24 Take 1 capsule (100 mg total) by mouth daily.       Past Surgical History:  She  has a past surgical history that includes Esophageal dilation and Tubal ligation.  Family History:  Her family history is not on file.  Social History:  She  reports that she quit smoking about 39 years ago. Her smoking use included cigarettes. She has a 10.00 pack-year smoking history. She has never used smokeless tobacco. She reports current alcohol use of about 1.0 standard drinks of alcohol per week. She reports that she does not use drugs.

## 2019-06-19 ENCOUNTER — Telehealth: Payer: Self-pay | Admitting: Pulmonary Disease

## 2019-06-19 NOTE — Telephone Encounter (Signed)
Called and spoke with Patient. Patient cancelled 07/10/19 injection.  Patient stated she would be out of town until 08/02/19.  Patient scheduled 08/05/2019 at 1000.  Nothing further at this time.

## 2019-07-10 ENCOUNTER — Ambulatory Visit: Payer: Medicare Other

## 2019-07-29 ENCOUNTER — Telehealth: Payer: Self-pay | Admitting: Pulmonary Disease

## 2019-07-29 NOTE — Telephone Encounter (Signed)
Fasenra Order: 30mg #1 prefilled syringe Ordered date: 07/29/19 Expected date of arrival: 07/30/19 Ordered by: Arriana Lohmann,LPN [facility] 

## 2019-07-30 NOTE — Telephone Encounter (Signed)
Fasenra Shipment Received:  30mg #1 prefilled syringe Medication arrival date: 07/30/2019 Lot #: [id number]  Exp date: 08/2020 Received by: Dorwin Fitzhenry, CMA   

## 2019-08-05 ENCOUNTER — Other Ambulatory Visit: Payer: Self-pay

## 2019-08-05 ENCOUNTER — Ambulatory Visit (INDEPENDENT_AMBULATORY_CARE_PROVIDER_SITE_OTHER): Payer: Medicare PPO

## 2019-08-05 DIAGNOSIS — J4550 Severe persistent asthma, uncomplicated: Secondary | ICD-10-CM | POA: Diagnosis not present

## 2019-08-05 DIAGNOSIS — J449 Chronic obstructive pulmonary disease, unspecified: Secondary | ICD-10-CM

## 2019-08-05 MED ORDER — BENRALIZUMAB 30 MG/ML ~~LOC~~ SOSY
30.0000 mg | PREFILLED_SYRINGE | Freq: Once | SUBCUTANEOUS | Status: AC
  Administered 2019-08-05: 30 mg via SUBCUTANEOUS

## 2019-08-05 NOTE — Progress Notes (Signed)
All questions were answered by the patient before medication was administered. Have you been hospitalized in the last 10 days? No Do you have a fever? No Do you have a cough? No Do you have a headache or sore throat? No  

## 2019-08-19 ENCOUNTER — Telehealth: Payer: Self-pay | Admitting: Pulmonary Disease

## 2019-08-19 MED ORDER — BUDESONIDE-FORMOTEROL FUMARATE 160-4.5 MCG/ACT IN AERO: 2.0000 | INHALATION_SPRAY | Freq: Two times a day (BID) | RESPIRATORY_TRACT | 3 refills | Status: DC

## 2019-08-19 NOTE — Telephone Encounter (Signed)
Rx for symbicort sent to pt's preferred pharmacy. Called and spoke with pt letting her know this had been done and also stated to pt that we are advising our patients to get the covid vaccine and she verbalized understanding.nothing further needed.

## 2019-09-02 IMAGING — MG DIGITAL SCREENING BILATERAL MAMMOGRAM WITH TOMO AND CAD
8 series · 8 of 24 positions shown · non-contrast
Comparison: Previous exam(s).

CLINICAL DATA: Screening.

EXAM:
DIGITAL SCREENING BILATERAL MAMMOGRAM WITH TOMO AND CAD

[L MLO synth-2D]
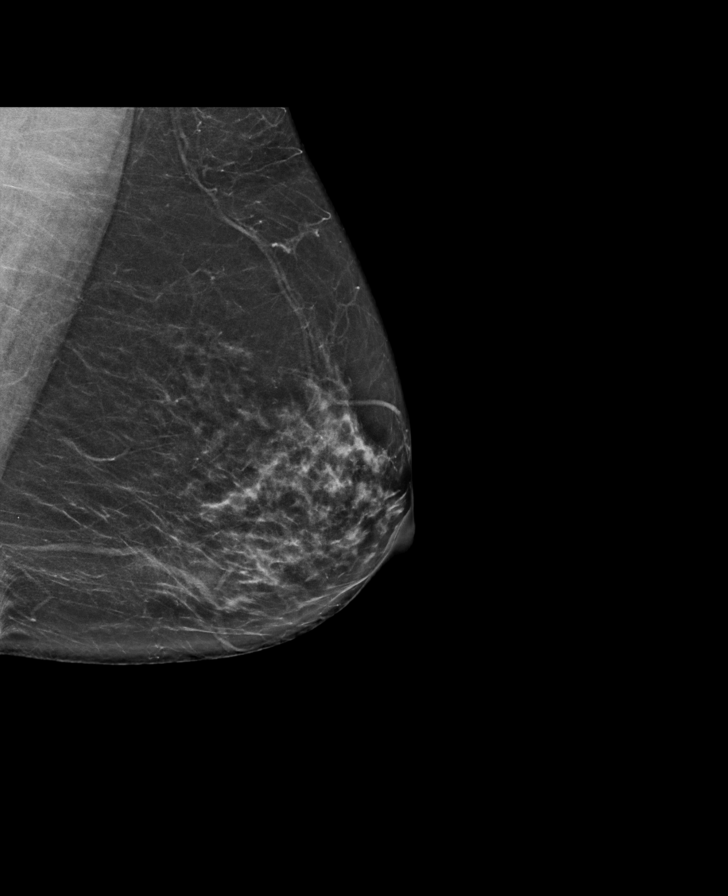

[R MLO synth-2D]
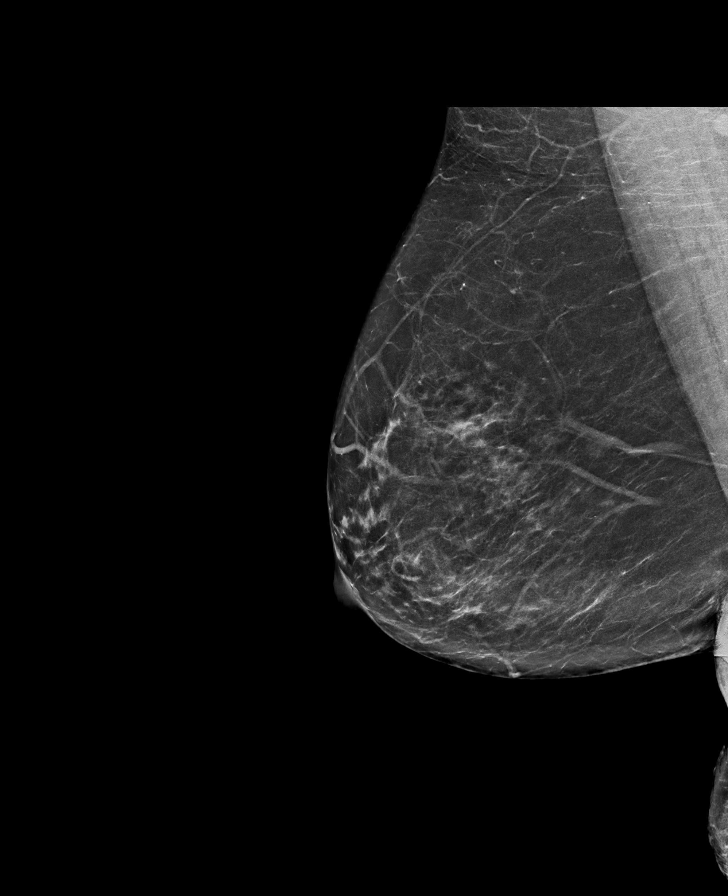

[R CC synth-2D]
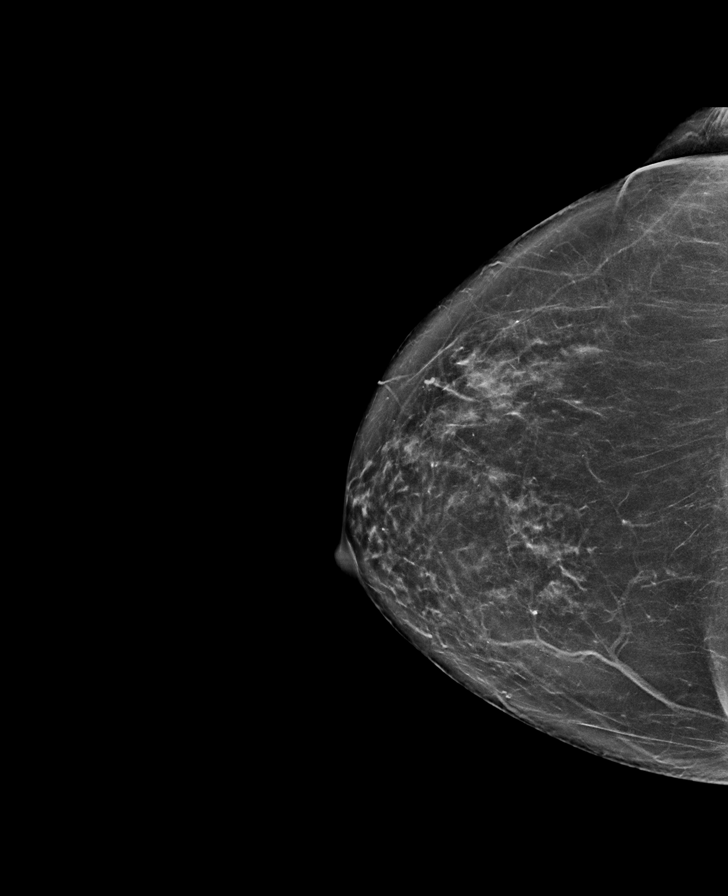

[L CC synth-2D]
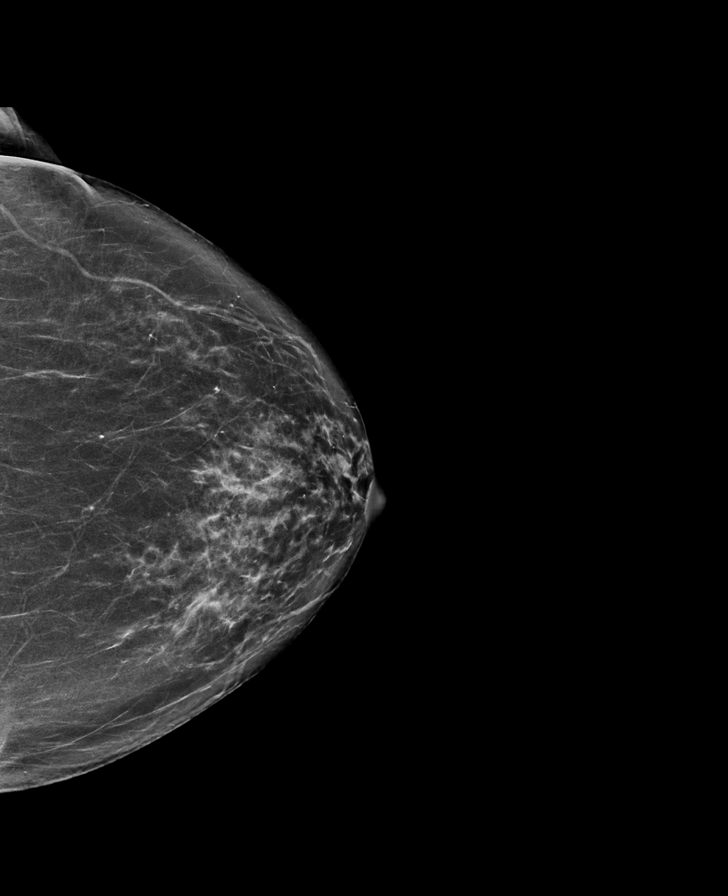

[L MLO tomo · tomo slice 36/71.0]
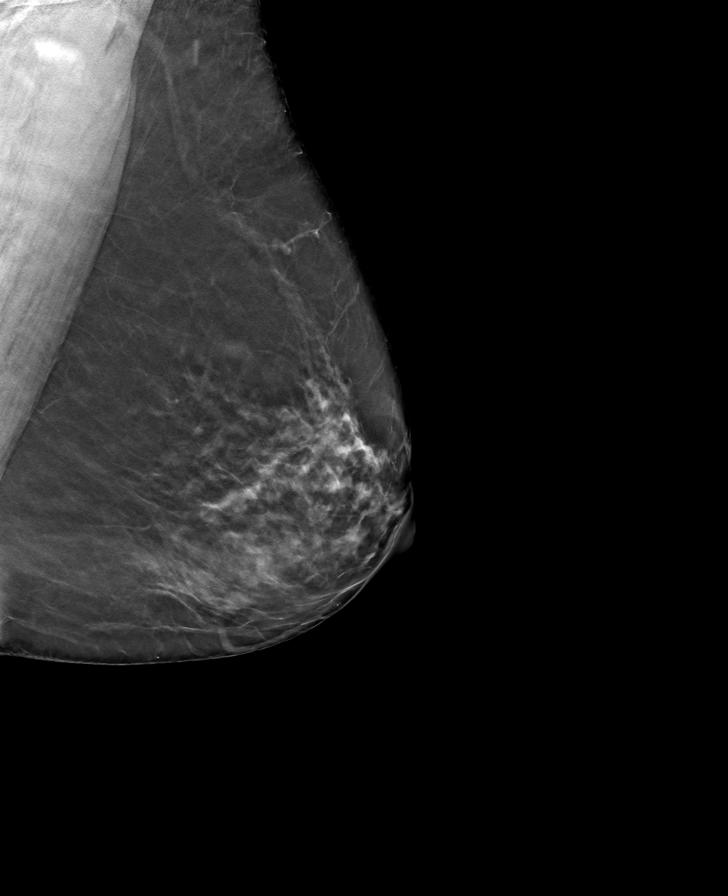

[R CC tomo · tomo slice 36/71.0]
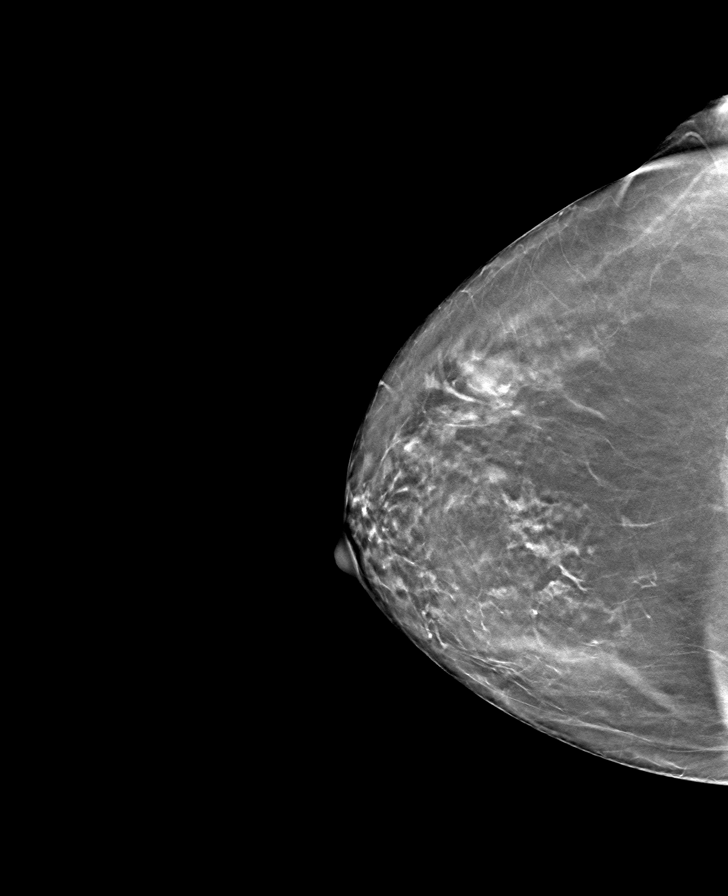

[R MLO tomo · tomo slice 37/73.0]
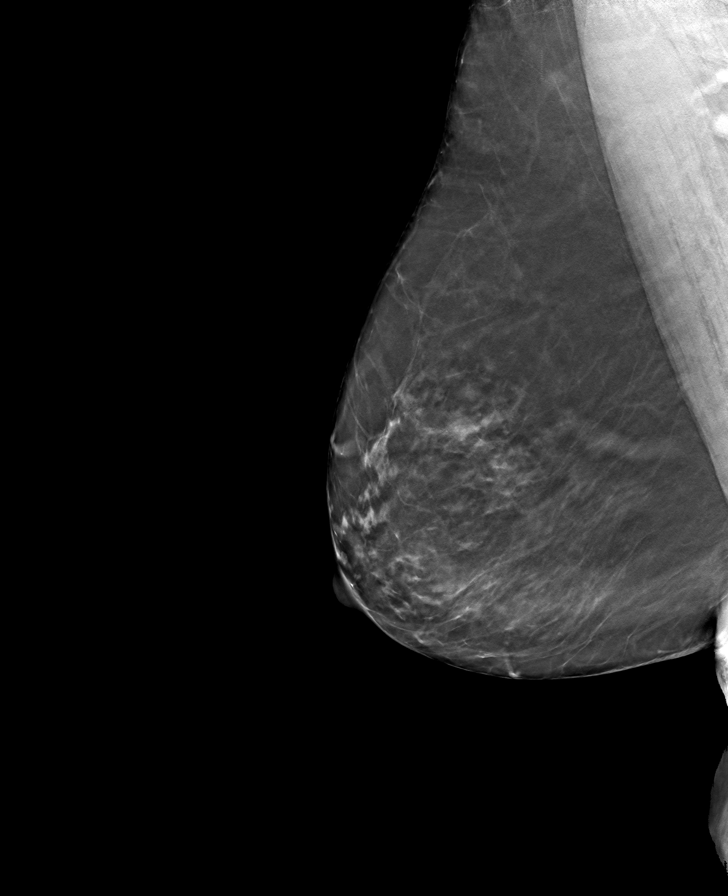

[L CC tomo · tomo slice 35/70.0]
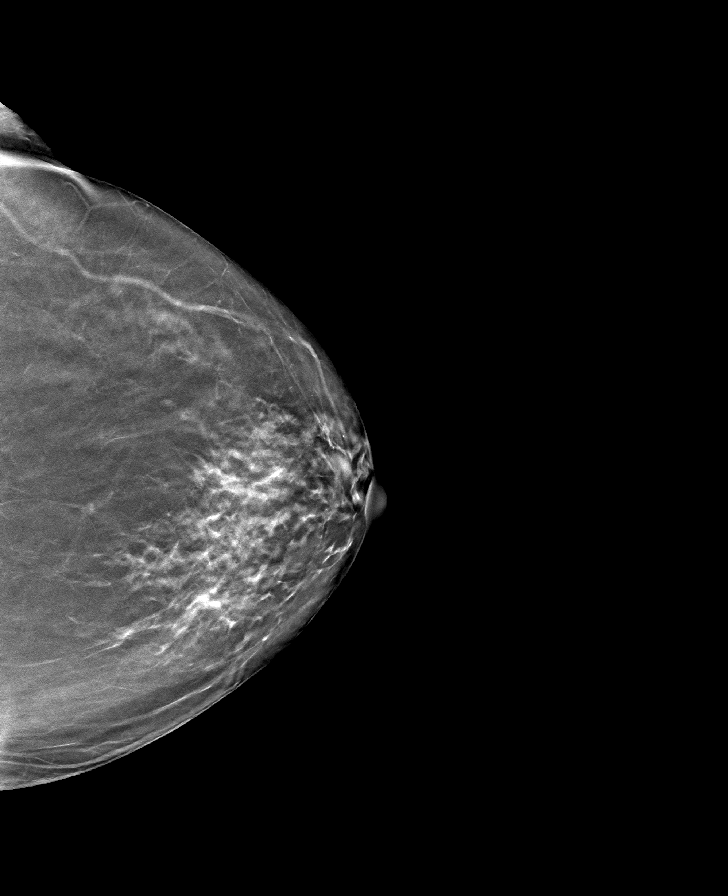

[8 of 24 positions shown; findings below may reference images not displayed]

ACR Breast Density Category b: There are scattered areas of
fibroglandular density.
FINDINGS: There are no findings suspicious for malignancy. Images were
processed with CAD.
IMPRESSION: No mammographic evidence of malignancy. A result letter of this
screening mammogram will be mailed directly to the patient.

RECOMMENDATION:
Screening mammogram in one year. (Code:[id number])

BI-RADS CATEGORY  1: Negative.

## 2019-09-08 ENCOUNTER — Ambulatory Visit: Payer: Medicare PPO

## 2019-09-23 ENCOUNTER — Telehealth: Payer: Self-pay | Admitting: Pulmonary Disease

## 2019-09-23 NOTE — Telephone Encounter (Signed)
Harrington Challenger Order: 30mg  #1 prefilled syringe Ordered date: 09/23/2019 Expected date of arrival: 09/24/2019 Ordered by: 09/26/2019, CMA  [facility]

## 2019-09-24 ENCOUNTER — Ambulatory Visit: Payer: Medicare Other

## 2019-09-24 NOTE — Telephone Encounter (Signed)
Fasenra Shipment Received:  30mg  #1 prefilled syringe Medication arrival date: 09/24/2019 Lot #: [id number] Exp date: 09/2020 Received by: 10/2020, CMA

## 2019-09-30 ENCOUNTER — Ambulatory Visit: Payer: Medicare Other

## 2019-09-30 ENCOUNTER — Ambulatory Visit (INDEPENDENT_AMBULATORY_CARE_PROVIDER_SITE_OTHER): Payer: Medicare PPO

## 2019-09-30 ENCOUNTER — Other Ambulatory Visit: Payer: Self-pay

## 2019-09-30 DIAGNOSIS — J449 Chronic obstructive pulmonary disease, unspecified: Secondary | ICD-10-CM | POA: Diagnosis not present

## 2019-09-30 MED ORDER — BENRALIZUMAB 30 MG/ML ~~LOC~~ SOSY
30.0000 mg | PREFILLED_SYRINGE | Freq: Once | SUBCUTANEOUS | Status: AC
  Administered 2019-09-30: 30 mg via SUBCUTANEOUS

## 2019-09-30 NOTE — Progress Notes (Signed)
All questions were answered by the patient before medication was administered. Have you been hospitalized in the last 10 days? No Do you have a fever? No Do you have a cough? No Do you have a headache or sore throat? No  

## 2019-11-18 ENCOUNTER — Telehealth: Payer: Self-pay | Admitting: Pulmonary Disease

## 2019-11-18 NOTE — Telephone Encounter (Signed)
Harrington Challenger Order: 30mg  #1 prefilled syringe Ordered date: 11/18/19 Expected date of arrival: 11/19/19 Ordered by: Kalob Bergen,LPN [facility]

## 2019-11-19 NOTE — Telephone Encounter (Signed)
Fasenra Shipment Received:  30mg  #1 prefilled syringe Medication arrival date: 11/19/19 Lot #: [id number] Exp date: 02/27/2021 Received by: 03/01/2021

## 2019-11-25 ENCOUNTER — Ambulatory Visit: Payer: Medicare PPO

## 2019-11-25 ENCOUNTER — Ambulatory Visit (INDEPENDENT_AMBULATORY_CARE_PROVIDER_SITE_OTHER): Payer: Medicare PPO

## 2019-11-25 ENCOUNTER — Other Ambulatory Visit: Payer: Self-pay

## 2019-11-25 DIAGNOSIS — J4550 Severe persistent asthma, uncomplicated: Secondary | ICD-10-CM | POA: Diagnosis not present

## 2019-11-25 MED ORDER — BENRALIZUMAB 30 MG/ML ~~LOC~~ SOSY
30.0000 mg | PREFILLED_SYRINGE | Freq: Once | SUBCUTANEOUS | Status: AC
  Administered 2019-11-25: 30 mg via SUBCUTANEOUS

## 2019-11-25 NOTE — Progress Notes (Signed)
All questions were answered by the patient before medication was administered. Have you been hospitalized in the last 10 days? No Do you have a fever? No Do you have a cough? No Do you have a headache or sore throat? No  

## 2020-01-01 ENCOUNTER — Ambulatory Visit: Payer: Medicare PPO | Admitting: Pulmonary Disease

## 2020-01-13 ENCOUNTER — Telehealth: Payer: Self-pay | Admitting: Pulmonary Disease

## 2020-01-13 NOTE — Telephone Encounter (Signed)
Harrington Challenger Order: 30mg  #1 prefilled syringe Ordered date: 01/13/2020 Expected date of arrival: 01/14/2020 Ordered by: 01/16/2020, CMA  [facility]

## 2020-01-14 NOTE — Telephone Encounter (Signed)
Fasenra Shipment Received:  30mg #1 prefilled syringe Medication arrival date: 01/14/2020 Lot #: [id number] Exp date: 04/2021 Received by: Baker Moronta, CMA   

## 2020-01-15 ENCOUNTER — Other Ambulatory Visit: Payer: Self-pay | Admitting: Family Medicine

## 2020-01-15 DIAGNOSIS — Z1231 Encounter for screening mammogram for malignant neoplasm of breast: Secondary | ICD-10-CM

## 2020-01-20 ENCOUNTER — Other Ambulatory Visit: Payer: Self-pay

## 2020-01-20 ENCOUNTER — Ambulatory Visit (INDEPENDENT_AMBULATORY_CARE_PROVIDER_SITE_OTHER): Payer: Medicare PPO

## 2020-01-20 DIAGNOSIS — J4550 Severe persistent asthma, uncomplicated: Secondary | ICD-10-CM

## 2020-01-20 MED ORDER — BENRALIZUMAB 30 MG/ML ~~LOC~~ SOSY
30.0000 mg | PREFILLED_SYRINGE | Freq: Once | SUBCUTANEOUS | Status: AC
  Administered 2020-01-20: 30 mg via SUBCUTANEOUS

## 2020-01-20 NOTE — Progress Notes (Signed)
Have you been hospitalized within the last 10 days?  No Do you have a fever?  No Do you have a cough?  No Do you have a headache or sore throat? No Do you have your Epi Pen visible and is it within date?  Yes 

## 2020-02-14 ENCOUNTER — Ambulatory Visit: Payer: Medicare PPO | Admitting: Pulmonary Disease

## 2020-02-14 ENCOUNTER — Encounter: Payer: Self-pay | Admitting: Pulmonary Disease

## 2020-02-14 ENCOUNTER — Other Ambulatory Visit: Payer: Self-pay

## 2020-02-14 VITALS — BP 106/70 | HR 67 | Temp 97.7°F | Ht 64.0 in | Wt 184.8 lb

## 2020-02-14 DIAGNOSIS — J4550 Severe persistent asthma, uncomplicated: Secondary | ICD-10-CM | POA: Diagnosis not present

## 2020-02-14 DIAGNOSIS — J8283 Eosinophilic asthma: Secondary | ICD-10-CM

## 2020-02-14 DIAGNOSIS — J449 Chronic obstructive pulmonary disease, unspecified: Secondary | ICD-10-CM | POA: Diagnosis not present

## 2020-02-14 MED ORDER — SPIRIVA HANDIHALER 18 MCG IN CAPS: ORAL_CAPSULE | RESPIRATORY_TRACT | 3 refills | Status: DC

## 2020-02-14 MED ORDER — ALBUTEROL SULFATE HFA 108 (90 BASE) MCG/ACT IN AERS: 2.0000 | INHALATION_SPRAY | Freq: Four times a day (QID) | RESPIRATORY_TRACT | 3 refills | Status: DC | PRN

## 2020-02-14 NOTE — Patient Instructions (Signed)
Call in few weeks after restarting spiriva if your breathing symptoms don't improve  Follow up in 6 months

## 2020-02-14 NOTE — Progress Notes (Signed)
Mary [name], Critical Care, and Sleep Medicine  Chief Complaint  Patient presents with  . Follow-up    has been out of spiriva for months.  labored breathing anytime.  productive cough with clear phlem.    Constitutional:  BP 106/70 (BP Location: Left Arm, Cuff Size: Normal)   Pulse 67   Temp 97.7 F (36.5 C) (Oral)   Ht 5\' 4"  (1.626 m)   Wt 184 lb 12.8 oz (83.8 kg)   SpO2 100%   BMI 31.72 kg/m   Past Medical History:  PNA, Osteoporosis, Hypothyroidism, GERD, Esophageal stricture, Colon polyp, Nephrolithiasis, b/l hydronephrosis  Brief Summary:  Mary [name] is a 69 y.o. female former smoker with COPD/asthma with eosinophilic phenotype, allergic rhinitis, and exercise induced bronchoconstriction.  Subjective:  She ran out of spiriva.  She doesn't remember how long it has been since she ran out.  She has noticed feeling more labored breathing.  She is able to walk 5 miles per day, but does feel more tired and winded compared to before.  Not having cough, wheeze, sputum, sinus congestion, sore throat, chest pain, or leg swelling.   Physical Exam:   Appearance - well kempt   ENMT - no sinus tenderness, no oral exudate, no LAN, Mallampati 2 airway, no stridor  Respiratory - equal breath sounds bilaterally, no wheezing or rales  CV - s1s2 regular rate and rhythm, no murmurs  Ext - no clubbing, no edema  Skin - no rashes  Psych - normal mood and affect    Assessment/Plan:   COPD with asthma and eosinophilic phenotype, and chronic bronchitis. - intolerant of xolair in 2010 - daliresp ineffective - started fasenra September 2019 and has tolerated this well - will resume spiriva - continue symbicort, singulair - prn albuterol, mucinex - if respiratory symptoms don't improve after restarting spiriva, then she would need additional testing  Allergic rhinitis. - improved with use of fasenra - continue singulair - prn flonase  A total of  24 minutes  spent addressing patient care issues on day of visit.   Follow up:   Patient Instructions  Call in few weeks after restarting spiriva if your breathing symptoms don't improve  Follow up in 6 months   Signature:  October 2019, MD [place] [name]/Critical Care Pager: [phone] 02/14/2020, 11:05 AM  Flow Sheet     [name] tests:   RAST 02/28/08 >> IgE 327.6, multiple allergens  PFT 02/28/08 >>FEV1 1.44(58%), FEV1% 48, TLC 5.25(99%), DLCO 76%   PFT 02/05/10 >>FEV1 1.44(60%), FEV1% 51, TLC 5.22(99%), DLCO 78%, +BD   Started xolair 08/14/08 >> stopped 09/10 (pt intolerant of medication)  Spirometry 09/12/11 >> FEV1 1.28(60%), FEV1% 55  Spirometry 12/04/14 >> FEV1 1.22 (62%), FEV1% 52  RAST 02/03/17 >> dog, cat, dust mite, mold, tree bark, IgE 333  Spirometry 02/03/17 >> FEV1 1.2 (61%), FEV1% 54  IgE 02/04/19 >> 344  Chest imaging:   CT chest 04/18/14 >> centrilobular emphysema  Medications:   Allergies as of 02/14/2020   No Known Allergies     Medication List       Accurate as of February 14, 2020 11:05 AM. If you have any questions, ask your nurse or doctor.        albuterol 108 (90 Base) MCG/ACT inhaler Commonly known as: ProAir HFA Inhale 2 puffs into the lungs every 6 (six) hours as needed. What changed: Another medication with the same name was removed. Continue taking this medication, and follow the directions you see here.  Changed by: Coralyn Helling, MD   budesonide-formoterol 160-4.5 MCG/ACT inhaler Commonly known as: Symbicort Inhale 2 puffs into the lungs 2 (two) times daily.   CULTURELLE PO Take by mouth.   Fasenra 30 MG/ML Sosy Generic drug: Benralizumab Inject 30 mg into the skin every 8 (eight) weeks.   fluticasone 50 MCG/ACT nasal spray Commonly known as: FLONASE Place 2 sprays into both nostrils daily.   guaiFENesin 600 MG 12 hr tablet Commonly known as: MUCINEX Take 1,200 mg by mouth daily.   levothyroxine 75 MCG  tablet Commonly known as: SYNTHROID Take 1 tablet by mouth daily.   Magnesium 100 MG Caps Take by mouth.   montelukast 10 MG tablet Commonly known as: SINGULAIR Take 1 tablet (10 mg total) by mouth at bedtime.   omeprazole 20 MG capsule Commonly known as: PRILOSEC TAKE 1 CAPSULE BY MOUTH TWICE DAILY 30 MINUTES BEFORE FIRST AND LAST MEAL   simvastatin 10 MG tablet Commonly known as: ZOCOR Take 10 mg by mouth daily.   Spiriva HandiHaler 18 MCG inhalation capsule Generic drug: tiotropium INHALE 1 CAPSULE VIA INHALER EVERY DAY   theophylline 100 MG 24 hr capsule Commonly known as: THEO-24 Take 1 capsule (100 mg total) by mouth daily.       Past Surgical History:  She  has a past surgical history that includes Esophageal dilation and Tubal ligation.  Family History:  Her family history is not on file.  Social History:  She  reports that she quit smoking about 40 years ago. Her smoking use included cigarettes. She has a 10.00 pack-year smoking history. She has never used smokeless tobacco. She reports current alcohol use of about 1.0 standard drink of alcohol per week. She reports that she does not use drugs.

## 2020-02-27 ENCOUNTER — Other Ambulatory Visit: Payer: Self-pay

## 2020-02-27 ENCOUNTER — Ambulatory Visit
Admission: RE | Admit: 2020-02-27 | Discharge: 2020-02-27 | Disposition: A | Discharge status: Home / Self Care | Payer: Medicare PPO | Source: Ambulatory Visit | Attending: Family Medicine | Admitting: Family Medicine

## 2020-02-27 DIAGNOSIS — Z1231 Encounter for screening mammogram for malignant neoplasm of breast: Secondary | ICD-10-CM

## 2020-03-09 ENCOUNTER — Telehealth: Payer: Self-pay | Admitting: Pulmonary Disease

## 2020-03-09 NOTE — Telephone Encounter (Signed)
Harrington Challenger Order: 30mg  #1 prefilled syringe Ordered date: 03/09/2020 Expected date of arrival: 03/10/2020 Ordered by: 05/10/2020, CMA  [facility]

## 2020-03-10 NOTE — Telephone Encounter (Signed)
Fasenra Shipment Received:  30mg  #1 prefilled syringe Medication arrival date: 03/10/2020 Lot #: [id number] Exp date: 04/2021 Received by: 05/2021, CMA

## 2020-03-16 ENCOUNTER — Other Ambulatory Visit: Payer: Self-pay

## 2020-03-16 ENCOUNTER — Ambulatory Visit (INDEPENDENT_AMBULATORY_CARE_PROVIDER_SITE_OTHER): Payer: Medicare PPO

## 2020-03-16 DIAGNOSIS — J4550 Severe persistent asthma, uncomplicated: Secondary | ICD-10-CM

## 2020-03-16 MED ORDER — BENRALIZUMAB 30 MG/ML ~~LOC~~ SOSY
30.0000 mg | PREFILLED_SYRINGE | Freq: Once | SUBCUTANEOUS | Status: AC
  Administered 2020-03-16: 30 mg via SUBCUTANEOUS

## 2020-03-16 NOTE — Progress Notes (Signed)
Have you been hospitalized within the last 10 days?  No Do you have a fever?  No Do you have a cough?  No Do you have a headache or sore throat? No Do you have your Epi Pen visible and is it within date?  Yes 

## 2020-04-27 ENCOUNTER — Telehealth: Payer: Self-pay | Admitting: Pulmonary Disease

## 2020-04-27 NOTE — Telephone Encounter (Signed)
Harrington Challenger Order: 30mg  #1 prefilled syringe Ordered date: 04/27/20 Expected date of arrival: 04/29/20 Ordered by: Jenne Sellinger,LPN Specialty Pharmacy: 05/01/20

## 2020-04-30 NOTE — Telephone Encounter (Signed)
Fasenra Shipment Received:  30mg  #1 prefilled syringe Medication arrival date: 04/30/20 Lot #: [id number] Exp date: 06/30/2021 Received by: 07/02/2021

## 2020-05-01 DIAGNOSIS — F339 Major depressive disorder, recurrent, unspecified: Secondary | ICD-10-CM | POA: Insufficient documentation

## 2020-05-01 DIAGNOSIS — R251 Tremor, unspecified: Secondary | ICD-10-CM | POA: Insufficient documentation

## 2020-05-01 DIAGNOSIS — E559 Vitamin D deficiency, unspecified: Secondary | ICD-10-CM | POA: Insufficient documentation

## 2020-05-01 DIAGNOSIS — E785 Hyperlipidemia, unspecified: Secondary | ICD-10-CM | POA: Insufficient documentation

## 2020-05-01 DIAGNOSIS — I700 Atherosclerosis of aorta: Secondary | ICD-10-CM | POA: Insufficient documentation

## 2020-05-08 ENCOUNTER — Telehealth: Payer: Self-pay | Admitting: Pulmonary Disease

## 2020-05-11 NOTE — Telephone Encounter (Signed)
Pt calling back to reschedule injection . Pt can be reached at [phone]

## 2020-05-11 NOTE — Telephone Encounter (Signed)
Patient rescheduled for 05/14/20 at 1045.

## 2020-05-12 ENCOUNTER — Ambulatory Visit: Payer: Medicare PPO

## 2020-05-14 ENCOUNTER — Ambulatory Visit (INDEPENDENT_AMBULATORY_CARE_PROVIDER_SITE_OTHER): Payer: Medicare PPO

## 2020-05-14 ENCOUNTER — Other Ambulatory Visit: Payer: Self-pay

## 2020-05-14 DIAGNOSIS — J4550 Severe persistent asthma, uncomplicated: Secondary | ICD-10-CM

## 2020-05-14 MED ORDER — BENRALIZUMAB 30 MG/ML ~~LOC~~ SOSY
30.0000 mg | PREFILLED_SYRINGE | Freq: Once | SUBCUTANEOUS | Status: AC
Start: 2020-05-14 — End: 2020-05-14
  Administered 2020-05-14: 30 mg via SUBCUTANEOUS

## 2020-05-14 NOTE — Progress Notes (Signed)
Have you been hospitalized within the last 10 days?  No Do you have a fever?  No Do you have a cough?  No Do you have a headache or sore throat? No Do you have your Epi Pen visible and is it within date?  Yes 

## 2020-05-15 ENCOUNTER — Telehealth: Payer: Self-pay | Admitting: Pulmonary Disease

## 2020-05-15 NOTE — Telephone Encounter (Signed)
Due to power outage 05/14/20.  Patient could not schedule her next Fasenra injection. Patient's next fasenra injection is due 07/09/20. ATC Patient x's 2 to schedule injection.

## 2020-05-15 NOTE — Telephone Encounter (Signed)
Patient returned call. Patient scheduled 07/09/20 at 1000.

## 2020-05-29 ENCOUNTER — Other Ambulatory Visit: Payer: Self-pay | Admitting: Pulmonary Disease

## 2020-05-29 NOTE — Telephone Encounter (Signed)
Dr Craige Cotta- Are you wanting her to continue on the Theo-24?  I did not see it mentioned in last ov notes  Please advise, thanks!

## 2020-06-01 ENCOUNTER — Telehealth: Payer: Self-pay | Admitting: Pulmonary Disease

## 2020-06-01 MED ORDER — MONTELUKAST SODIUM 10 MG PO TABS: 10.0000 mg | ORAL_TABLET | Freq: Every day | ORAL | 3 refills | Status: DC

## 2020-06-01 NOTE — Telephone Encounter (Signed)
Rx for montelukast has been sent to preferred pharmacy for pt. Called and spoke with pt letting her know this had been done and she verbalized understanding. Nothing further needed.

## 2020-06-29 ENCOUNTER — Telehealth: Payer: Self-pay | Admitting: Pulmonary Disease

## 2020-06-29 NOTE — Telephone Encounter (Signed)
Harrington Challenger Order: 30mg  #1 prefilled syringe Ordered date: 06/29/20 Expected date of arrival: 06/30/20 Ordered by: Gael Delude,LPN Specialty Pharmacy: 07/02/20

## 2020-07-02 NOTE — Telephone Encounter (Signed)
Fasenra Shipment Received:  30mg  #1 prefilled syringe Medication arrival date: 06/30/20 Lot #: [id number] Exp date: 08/31/2021 Received by: 09/02/2021

## 2020-07-09 ENCOUNTER — Telehealth: Payer: Self-pay | Admitting: Pulmonary Disease

## 2020-07-09 ENCOUNTER — Ambulatory Visit: Payer: Medicare PPO

## 2020-07-09 ENCOUNTER — Other Ambulatory Visit: Payer: Self-pay

## 2020-07-09 DIAGNOSIS — E538 Deficiency of other specified B group vitamins: Secondary | ICD-10-CM | POA: Insufficient documentation

## 2020-07-09 DIAGNOSIS — G9332 Myalgic encephalomyelitis/chronic fatigue syndrome: Secondary | ICD-10-CM | POA: Insufficient documentation

## 2020-07-09 DIAGNOSIS — J4550 Severe persistent asthma, uncomplicated: Secondary | ICD-10-CM

## 2020-07-09 DIAGNOSIS — R7303 Prediabetes: Secondary | ICD-10-CM | POA: Insufficient documentation

## 2020-07-09 MED ORDER — BENRALIZUMAB 30 MG/ML ~~LOC~~ SOSY
30.0000 mg | PREFILLED_SYRINGE | Freq: Once | SUBCUTANEOUS | Status: AC
  Administered 2020-07-09: 30 mg via SUBCUTANEOUS

## 2020-07-09 MED ORDER — EPINEPHRINE 0.3 MG/0.3ML IJ SOAJ
0.3000 mg | Freq: Once | INTRAMUSCULAR | 11 refills | Status: AC
Start: 2020-07-09 — End: 2020-07-09

## 2020-07-09 NOTE — Progress Notes (Signed)
Have you been hospitalized within the last 10 days?  No Do you have a fever?  No Do you have a cough?  No Do you have a headache or sore throat? No Patient current Epipen expired. New Epipen prescription sent to [facility] per Patient request.

## 2020-07-09 NOTE — Telephone Encounter (Signed)
Spoke with Patient. Patient stated she was unable to make her 1000 injection appointment, but could be here around 2 pm.  Patient scheduled 2pm for Fasenra injection.  Nothing further at time.

## 2020-07-14 ENCOUNTER — Other Ambulatory Visit: Payer: Self-pay | Admitting: Pulmonary Disease

## 2020-07-24 ENCOUNTER — Other Ambulatory Visit: Payer: Self-pay | Admitting: Pulmonary Disease

## 2020-07-27 ENCOUNTER — Other Ambulatory Visit: Payer: Self-pay | Admitting: Pulmonary Disease

## 2020-07-27 ENCOUNTER — Telehealth: Payer: Self-pay | Admitting: Pulmonary Disease

## 2020-07-27 MED ORDER — SPIRIVA HANDIHALER 18 MCG IN CAPS: ORAL_CAPSULE | RESPIRATORY_TRACT | 3 refills | Status: DC

## 2020-07-27 NOTE — Telephone Encounter (Signed)
Pt called, states her pharmacy requested a refill of Spiriva from our office but it was denied- pt was last seen in 01/2020, no reason to deny this refill.  rx sent to pharmacy as requested.  Nothing further needed at this time- will close encounter.

## 2020-08-24 ENCOUNTER — Telehealth: Payer: Self-pay | Admitting: Pulmonary Disease

## 2020-08-24 NOTE — Telephone Encounter (Signed)
Harrington Challenger Order: 30mg  #1 prefilled syringe Ordered date: 08/24/20 Expected date of arrival: 08/25/20 Ordered by: Gradie Ohm,LPN Specialty Pharmacy: 08/27/20

## 2020-09-03 ENCOUNTER — Ambulatory Visit: Payer: Medicare PPO

## 2020-09-08 ENCOUNTER — Telehealth: Payer: Self-pay | Admitting: Pulmonary Disease

## 2020-09-08 NOTE — Telephone Encounter (Signed)
Called and spoke with Patient.  Patient scheduled Fasenra injection 09/10/20.

## 2020-09-10 ENCOUNTER — Ambulatory Visit: Payer: Medicare PPO

## 2020-09-10 ENCOUNTER — Other Ambulatory Visit: Payer: Self-pay

## 2020-09-10 DIAGNOSIS — J4550 Severe persistent asthma, uncomplicated: Secondary | ICD-10-CM

## 2020-09-10 MED ORDER — BENRALIZUMAB 30 MG/ML ~~LOC~~ SOSY
30.0000 mg | PREFILLED_SYRINGE | Freq: Once | SUBCUTANEOUS | Status: AC
  Administered 2020-09-10: 30 mg via SUBCUTANEOUS

## 2020-09-10 NOTE — Progress Notes (Signed)
Have you been hospitalized within the last 10 days?  No Do you have a fever?  No Do you have a cough?  No Do you have a headache or sore throat? No  

## 2020-09-22 ENCOUNTER — Telehealth: Payer: Self-pay | Admitting: Pulmonary Disease

## 2020-09-22 NOTE — Telephone Encounter (Signed)
Called and spoke with pt who states she has had a cough now x2 weeks which she said seems to be in upper throat area, not her chest.  Pt is coughing up clear phlegm. Denies any complaints of wheezing. Pt also has complaints of hoarseness.  Pt denies any complaints of fever, has not checked temp but does not feel feverish.  Pt is unsure if this is coming from her COPD or what could be causing her symptoms.  Pt has not had a covid test recently. Pt said that she has not been around anyone that has had covid.  Pt said that she does feel fine other than the cough and constant clearing of her throat.  Pt has not had to use rescue inhaler. Pt is using her spiriva and symbicort inhalers daily as prescribed.  Pt is also taking guaifenesin daily.  Pt wants to know if there is anything that could be recommended to help with symptoms. Dr. Craige Cotta, please advise.

## 2020-09-23 MED ORDER — FLUTICASONE PROPIONATE 50 MCG/ACT NA SUSP: 2.0000 | Freq: Every day | NASAL | 5 refills | Status: DC

## 2020-09-23 NOTE — Telephone Encounter (Signed)
Might be having allergies.  Please make sure she is using flonase daily.  She should also use sinus rinse daily (such as neil med sinus rinse, neti pot, etc).  She can also use OTC xyzal 5 mg daily.  She should schedule an appointment if her symptoms don't improve.

## 2020-09-23 NOTE — Telephone Encounter (Signed)
Called and spoke with patient. She stated that she takes Allegra daily but has not been taking Flonase. She has requested to have this sent to [facility] for her. I advised her to try this for a few days and to call us back if she does not notice any improvement. She verbalized understanding.   Nothing further needed at time of call.

## 2020-09-27 ENCOUNTER — Other Ambulatory Visit (HOSPITAL_COMMUNITY): Payer: Self-pay | Admitting: Pharmacy Technician

## 2020-09-27 DIAGNOSIS — J4550 Severe persistent asthma, uncomplicated: Secondary | ICD-10-CM | POA: Insufficient documentation

## 2020-09-30 ENCOUNTER — Telehealth: Payer: Self-pay | Admitting: Pharmacy Technician

## 2020-09-30 NOTE — Telephone Encounter (Signed)
Submitted a Prior Authorization request to [facility] for [facility] (medical benefit) via Cover My Meds. Will update once we receive a response.   KEY# [id number]

## 2020-09-30 NOTE — Telephone Encounter (Signed)
Nadja from [facility] would like to know if Harrington Challenger is buy in bill or pharmacy. Nadja phone number is [phone].

## 2020-10-01 NOTE — Telephone Encounter (Signed)
Received notification from [facility] regarding a prior authorization for [facility]. Authorization has been APPROVED from 09/30/20 to 07/31/21.   Authorization # [id number] Phone # [phone]

## 2020-10-25 ENCOUNTER — Other Ambulatory Visit: Payer: Self-pay | Admitting: Pulmonary Disease

## 2020-11-05 ENCOUNTER — Ambulatory Visit: Payer: Medicare PPO

## 2020-11-10 ENCOUNTER — Ambulatory Visit (INDEPENDENT_AMBULATORY_CARE_PROVIDER_SITE_OTHER): Payer: Medicare PPO

## 2020-11-10 ENCOUNTER — Other Ambulatory Visit: Payer: Self-pay

## 2020-11-10 VITALS — BP 124/76 | HR 87 | Temp 97.8°F | Resp 16

## 2020-11-10 DIAGNOSIS — J4550 Severe persistent asthma, uncomplicated: Secondary | ICD-10-CM | POA: Diagnosis not present

## 2020-11-10 MED ORDER — DIPHENHYDRAMINE HCL 50 MG/ML IJ SOLN: 50.0000 mg | Freq: Once | INTRAMUSCULAR | Status: DC | PRN

## 2020-11-10 MED ORDER — EPINEPHRINE 0.3 MG/0.3ML IJ SOAJ: 0.3000 mg | Freq: Once | INTRAMUSCULAR | Status: DC | PRN

## 2020-11-10 MED ORDER — BENRALIZUMAB 30 MG/ML ~~LOC~~ SOSY
30.0000 mg | PREFILLED_SYRINGE | Freq: Once | SUBCUTANEOUS | Status: AC
  Administered 2020-11-10: 30 mg via SUBCUTANEOUS

## 2020-11-10 MED ORDER — ALBUTEROL SULFATE HFA 108 (90 BASE) MCG/ACT IN AERS: 2.0000 | INHALATION_SPRAY | Freq: Once | RESPIRATORY_TRACT | Status: DC | PRN

## 2020-11-10 MED ORDER — METHYLPREDNISOLONE SODIUM SUCC 125 MG IJ SOLR: 125.0000 mg | Freq: Once | INTRAMUSCULAR | Status: DC | PRN

## 2020-11-10 MED ORDER — FAMOTIDINE IN NACL 20-0.9 MG/50ML-% IV SOLN: 20.0000 mg | Freq: Once | INTRAVENOUS | Status: DC | PRN

## 2020-11-10 MED ORDER — SODIUM CHLORIDE 0.9 % IV SOLN: Freq: Once | INTRAVENOUS | Status: DC | PRN

## 2020-11-10 NOTE — Progress Notes (Signed)
Diagnosis: Asthma  Provider:  Praveen Mannam, MD  Procedure: Injection  Fasenra (Benralizumab), Dose: 30 mg, Site: subcutaneous  Discharge: Condition: Good, Destination: Home . AVS provided to patient.   Performed by:  Oneta Sigman, RN       

## 2020-11-18 DIAGNOSIS — I739 Peripheral vascular disease, unspecified: Secondary | ICD-10-CM | POA: Insufficient documentation

## 2020-11-18 DIAGNOSIS — I071 Rheumatic tricuspid insufficiency: Secondary | ICD-10-CM | POA: Insufficient documentation

## 2020-11-18 DIAGNOSIS — I6529 Occlusion and stenosis of unspecified carotid artery: Secondary | ICD-10-CM | POA: Insufficient documentation

## 2021-01-11 ENCOUNTER — Ambulatory Visit (INDEPENDENT_AMBULATORY_CARE_PROVIDER_SITE_OTHER): Payer: Medicare PPO

## 2021-01-11 ENCOUNTER — Other Ambulatory Visit: Payer: Self-pay

## 2021-01-11 VITALS — BP 120/65 | HR 80 | Temp 98.6°F | Resp 16

## 2021-01-11 DIAGNOSIS — J4550 Severe persistent asthma, uncomplicated: Secondary | ICD-10-CM | POA: Diagnosis not present

## 2021-01-11 MED ORDER — EPINEPHRINE 0.3 MG/0.3ML IJ SOAJ: 0.3000 mg | Freq: Once | INTRAMUSCULAR | Status: DC | PRN

## 2021-01-11 MED ORDER — SODIUM CHLORIDE 0.9 % IV SOLN: Freq: Once | INTRAVENOUS | Status: DC | PRN

## 2021-01-11 MED ORDER — DIPHENHYDRAMINE HCL 50 MG/ML IJ SOLN: 50.0000 mg | Freq: Once | INTRAMUSCULAR | Status: DC | PRN

## 2021-01-11 MED ORDER — ALBUTEROL SULFATE HFA 108 (90 BASE) MCG/ACT IN AERS: 2.0000 | INHALATION_SPRAY | Freq: Once | RESPIRATORY_TRACT | Status: DC | PRN

## 2021-01-11 MED ORDER — FAMOTIDINE IN NACL 20-0.9 MG/50ML-% IV SOLN: 20.0000 mg | Freq: Once | INTRAVENOUS | Status: DC | PRN

## 2021-01-11 MED ORDER — METHYLPREDNISOLONE SODIUM SUCC 125 MG IJ SOLR: 125.0000 mg | Freq: Once | INTRAMUSCULAR | Status: DC | PRN

## 2021-01-11 MED ORDER — BENRALIZUMAB 30 MG/ML ~~LOC~~ SOSY
30.0000 mg | PREFILLED_SYRINGE | Freq: Once | SUBCUTANEOUS | Status: AC
  Administered 2021-01-11: 30 mg via SUBCUTANEOUS
  Filled 2021-01-11: qty 1

## 2021-01-11 NOTE — Progress Notes (Signed)
Diagnosis: Asthma  Provider:  Praveen Mannam, MD  Procedure: Injection  Fasenra (Benralizumab), Dose: 30 mg, Site: subcutaneous, right arm  Discharge: Condition: Good, Destination: Home . AVS provided to patient.   Performed by:  Caitlyne Ingham, RN        

## 2021-01-26 ENCOUNTER — Other Ambulatory Visit: Payer: Self-pay | Admitting: Family Medicine

## 2021-01-26 DIAGNOSIS — Z1231 Encounter for screening mammogram for malignant neoplasm of breast: Secondary | ICD-10-CM

## 2021-02-09 ENCOUNTER — Telehealth: Payer: Self-pay | Admitting: Pulmonary Disease

## 2021-02-09 MED ORDER — BUDESONIDE-FORMOTEROL FUMARATE 160-4.5 MCG/ACT IN AERO: 2.0000 | INHALATION_SPRAY | Freq: Two times a day (BID) | RESPIRATORY_TRACT | 2 refills | Status: DC

## 2021-02-09 MED ORDER — MONTELUKAST SODIUM 10 MG PO TABS: 10.0000 mg | ORAL_TABLET | Freq: Every day | ORAL | 2 refills | Status: DC

## 2021-02-09 MED ORDER — THEOPHYLLINE ER 100 MG PO CP24: 100.0000 mg | ORAL_CAPSULE | Freq: Every day | ORAL | 0 refills | Status: DC

## 2021-02-09 NOTE — Telephone Encounter (Signed)
Call returned to patient, confirmed DOB. Patient requesting refills be sent to pharmacy she has switched to. Confirmed medications and pharmacy. Refill sent.   Nothing further needed at this time.

## 2021-02-12 ENCOUNTER — Ambulatory Visit (INDEPENDENT_AMBULATORY_CARE_PROVIDER_SITE_OTHER): Payer: Medicare PPO

## 2021-02-12 ENCOUNTER — Ambulatory Visit: Payer: Medicare PPO | Admitting: Pulmonary Disease

## 2021-02-12 ENCOUNTER — Other Ambulatory Visit: Payer: Self-pay

## 2021-02-12 ENCOUNTER — Encounter: Payer: Self-pay | Admitting: Pulmonary Disease

## 2021-02-12 VITALS — BP 116/70 | HR 61 | Temp 97.7°F | Ht 64.0 in | Wt 179.4 lb

## 2021-02-12 DIAGNOSIS — R059 Cough, unspecified: Secondary | ICD-10-CM

## 2021-02-12 DIAGNOSIS — J449 Chronic obstructive pulmonary disease, unspecified: Secondary | ICD-10-CM | POA: Diagnosis not present

## 2021-02-12 NOTE — Progress Notes (Signed)
[place] [facility], Critical [facility], and Sleep Medicine  Chief Complaint  Patient presents with   Follow-up    Patient does not have any concerns.     Constitutional:  BP 116/70 (BP Location: Right Arm, Patient Position: Sitting, Cuff Size: Normal)   Pulse 61   Temp 97.7 F (36.5 C) (Oral)   Ht 5\' 4"  (1.626 m)   Wt 179 lb 6.4 oz (81.4 kg)   SpO2 99%   BMI 30.79 kg/m   Past Medical History:  PNA, Osteoporosis, Hypothyroidism, GERD, Esophageal stricture, Colon polyp, Nephrolithiasis, b/l hydronephrosis  Past Surgical History:  She  has a past surgical history that includes Esophageal dilation and Tubal ligation.  Brief Summary:  Mary [name] is a 70 y.o. female with former smoker with COPD/asthma with eosinophilic phenotype, allergic rhinitis, and exercise induced bronchoconstriction.      Subjective:   She did better after starting spiriva again.  More recently she has been getting more cough with chest congestion.  Bringing up clear sputum.  Gets some wheeze and chest tightness, especially at night.  Hasn't been using albuterol much.  Uses mucinex on regular basis.  No fever, hemoptysis, or sinus congestion.  Physical Exam:   Appearance - well kempt   ENMT - no sinus tenderness, no oral exudate, no LAN, Mallampati 2 airway, no stridor  Respiratory - equal breath sounds bilaterally, no wheezing or rales  CV - s1s2 regular rate and rhythm, no murmurs  Ext - no clubbing, no edema  Skin - no rashes  Psych - normal mood and affect   Pulmonary testing:  RAST 02/28/08 >> IgE 327.6, multiple allergens PFT 02/28/08 >> FEV1 1.44(58%), FEV1% 48, TLC 5.25(99%), DLCO 76%   PFT 02/05/10 >> FEV1 1.44(60%), FEV1% 51, TLC 5.22(99%), DLCO 78%, +BD   Started xolair 08/14/08 >> stopped 09/10 (pt intolerant of medication) Spirometry 09/12/11 >> FEV1 1.28(60%), FEV1% 55 Spirometry 12/04/14 >> FEV1 1.22 (62%), FEV1% 52 RAST 02/03/17 >> dog, cat, dust mite, mold, tree bark,  IgE 333 Spirometry 02/03/17 >> FEV1 1.2 (61%), FEV1% 54 IgE 02/04/19 >> 344  Chest Imaging:  CT chest 04/18/14 >> centrilobular emphysema  Social History:  She  reports that she quit smoking about 41 years ago. Her smoking use included cigarettes. She has a 10.00 pack-year smoking history. She has never used smokeless tobacco. She reports current alcohol use of about 1.0 standard drink of alcohol per week. She reports that she does not use drugs.  Family History:  Her family history is not on file.     Assessment/Plan:   COPD with asthma and eosinophilic phenotype, and chronic bronchitis. - intolerant of xolair in 2010 - daliresp ineffective - started fasenra September 2019 and has tolerated this well - continue spiriva, symbicort, singulair - advised her to try using albuterol more often when she is having symptoms - prn mucinex - will arrange for flutter valve - chest xray today   Allergic rhinitis. - continue fasenra, singulair - prn flonase  Time Spent Involved in Patient Care on Day of Examination:  24 minutes  Follow up:   Patient Instructions  Chest xray today  Will arrange for flutter valve - can use this twice per day as needed to help loosen chest congestion  Follow up in 1 year  Medication List:   Allergies as of 02/12/2021       Reactions   Zoledronic Acid Other (See Comments)   Other reaction(s): Tachycardia, patient provider will decide if she had  a true allergy.         Medication List        Accurate as of February 12, 2021  4:27 PM. If you have any questions, ask your nurse or doctor.          albuterol 108 (90 Base) MCG/ACT inhaler Commonly known as: ProAir HFA Inhale 2 puffs into the lungs every 6 (six) hours as needed.   budesonide-formoterol 160-4.5 MCG/ACT inhaler Commonly known as: SYMBICORT Inhale 2 puffs into the lungs 2 (two) times daily.   CULTURELLE PO Take by mouth.   escitalopram 5 MG tablet Commonly known as:  LEXAPRO Take 5 mg by mouth daily.   Fasenra 30 MG/ML Sosy Generic drug: Benralizumab Inject 30 mg into the skin every 8 (eight) weeks.   fluticasone 50 MCG/ACT nasal spray Commonly known as: FLONASE Place 2 sprays into both nostrils daily.   guaiFENesin 600 MG 12 hr tablet Commonly known as: MUCINEX Take 1,200 mg by mouth daily.   levothyroxine 75 MCG tablet Commonly known as: SYNTHROID Take 1 tablet by mouth daily.   Magnesium 100 MG Caps Take by mouth.   montelukast 10 MG tablet Commonly known as: SINGULAIR Take 1 tablet (10 mg total) by mouth at bedtime.   omeprazole 20 MG capsule Commonly known as: PRILOSEC TAKE 1 CAPSULE BY MOUTH TWICE DAILY 30 MINUTES BEFORE FIRST AND LAST MEAL   simvastatin 10 MG tablet Commonly known as: ZOCOR Take 10 mg by mouth daily.   Spiriva HandiHaler 18 MCG inhalation capsule Generic drug: tiotropium INHALE 1 CAPSULE VIA INHALER EVERY DAY   theophylline 100 MG 24 hr capsule Commonly known as: Theo-24 Take 1 capsule (100 mg total) by mouth daily.        Signature:  Coralyn Helling, MD [facility]/Critical Care Pager - [phone] 02/12/2021, 4:27 PM

## 2021-02-12 NOTE — Patient Instructions (Signed)
Chest xray today  Will arrange for flutter valve - can use this twice per day as needed to help loosen chest congestion  Follow up in 1 year

## 2021-02-12 NOTE — Addendum Note (Signed)
Addended by: Jacquiline Doe on: 02/12/2021 04:36 PM   Modules accepted: Orders

## 2021-02-18 ENCOUNTER — Telehealth: Payer: Self-pay | Admitting: Pulmonary Disease

## 2021-02-18 MED ORDER — SPIRIVA HANDIHALER 18 MCG IN CAPS: ORAL_CAPSULE | RESPIRATORY_TRACT | 3 refills | Status: DC

## 2021-02-18 NOTE — Telephone Encounter (Signed)
Pt returning a phone call. Pt can be reached at [phone]

## 2021-02-18 NOTE — Telephone Encounter (Signed)
Called and spoke with patient, patient states that her pharmacy received the prescription for the symbicort, but not the spiriva.  She needs the Spiriva handihaler sent to [facility].  Advised I would sent the script to her pharmacy.  She verbalized understanding.  Nothing further needed.

## 2021-02-18 NOTE — Telephone Encounter (Signed)
Called pt and there was no answer-LMTCB °

## 2021-03-08 ENCOUNTER — Other Ambulatory Visit: Payer: Self-pay

## 2021-03-08 ENCOUNTER — Ambulatory Visit (INDEPENDENT_AMBULATORY_CARE_PROVIDER_SITE_OTHER): Payer: Medicare PPO

## 2021-03-08 VITALS — BP 108/63 | HR 82 | Temp 97.8°F | Resp 16

## 2021-03-08 DIAGNOSIS — J4550 Severe persistent asthma, uncomplicated: Secondary | ICD-10-CM

## 2021-03-08 MED ORDER — FAMOTIDINE IN NACL 20-0.9 MG/50ML-% IV SOLN: 20.0000 mg | Freq: Once | INTRAVENOUS | Status: DC | PRN

## 2021-03-08 MED ORDER — ALBUTEROL SULFATE HFA 108 (90 BASE) MCG/ACT IN AERS: 2.0000 | INHALATION_SPRAY | Freq: Once | RESPIRATORY_TRACT | Status: DC | PRN

## 2021-03-08 MED ORDER — BENRALIZUMAB 30 MG/ML ~~LOC~~ SOSY
30.0000 mg | PREFILLED_SYRINGE | Freq: Once | SUBCUTANEOUS | Status: AC
  Administered 2021-03-08: 30 mg via SUBCUTANEOUS
  Filled 2021-03-08: qty 1

## 2021-03-08 MED ORDER — SODIUM CHLORIDE 0.9 % IV SOLN: Freq: Once | INTRAVENOUS | Status: DC | PRN

## 2021-03-08 MED ORDER — EPINEPHRINE 0.3 MG/0.3ML IJ SOAJ: 0.3000 mg | Freq: Once | INTRAMUSCULAR | Status: DC | PRN

## 2021-03-08 MED ORDER — METHYLPREDNISOLONE SODIUM SUCC 125 MG IJ SOLR: 125.0000 mg | Freq: Once | INTRAMUSCULAR | Status: DC | PRN

## 2021-03-08 MED ORDER — DIPHENHYDRAMINE HCL 50 MG/ML IJ SOLN: 50.0000 mg | Freq: Once | INTRAMUSCULAR | Status: DC | PRN

## 2021-03-08 NOTE — Progress Notes (Signed)
Diagnosis: Asthma  Provider:  Praveen Mannam, MD  Procedure: Injection  Fasenra (Benralizumab), Dose: 30 mg, Site: subcutaneous left arm  Discharge: Condition: Good, Destination: Home . AVS provided to patient.   Performed by:  Javiel Canepa E, LPN        

## 2021-03-10 DIAGNOSIS — M51369 Other intervertebral disc degeneration, lumbar region without mention of lumbar back pain or lower extremity pain: Secondary | ICD-10-CM | POA: Insufficient documentation

## 2021-03-10 DIAGNOSIS — M1990 Unspecified osteoarthritis, unspecified site: Secondary | ICD-10-CM | POA: Insufficient documentation

## 2021-03-10 DIAGNOSIS — M5416 Radiculopathy, lumbar region: Secondary | ICD-10-CM | POA: Insufficient documentation

## 2021-03-12 DIAGNOSIS — M5441 Lumbago with sciatica, right side: Secondary | ICD-10-CM | POA: Insufficient documentation

## 2021-03-19 ENCOUNTER — Encounter: Payer: Self-pay | Admitting: Pulmonary Disease

## 2021-03-19 ENCOUNTER — Ambulatory Visit
Admission: RE | Admit: 2021-03-19 | Discharge: 2021-03-19 | Disposition: A | Discharge status: Home / Self Care | Payer: Medicare PPO | Source: Ambulatory Visit | Attending: Family Medicine | Admitting: Family Medicine

## 2021-03-19 ENCOUNTER — Other Ambulatory Visit: Payer: Self-pay

## 2021-03-19 DIAGNOSIS — Z1231 Encounter for screening mammogram for malignant neoplasm of breast: Secondary | ICD-10-CM

## 2021-03-25 DIAGNOSIS — F321 Major depressive disorder, single episode, moderate: Secondary | ICD-10-CM | POA: Insufficient documentation

## 2021-03-25 DIAGNOSIS — F330 Major depressive disorder, recurrent, mild: Secondary | ICD-10-CM | POA: Insufficient documentation

## 2021-04-22 ENCOUNTER — Ambulatory Visit: Payer: Medicare PPO | Attending: Internal Medicine

## 2021-04-22 DIAGNOSIS — Z23 Encounter for immunization: Secondary | ICD-10-CM

## 2021-04-22 NOTE — Progress Notes (Signed)
   Covid-19 Vaccination Clinic  Name:  Mary [name]    MRN: [id number] DOB: [birth year]  04/22/2021  [name] was observed post Covid-19 immunization for 15 minutes without incident. She was provided with Vaccine Information Sheet and instruction to access the V-Safe system.   [name] was instructed to call 911 with any severe reactions post vaccine: Difficulty breathing  Swelling of face and throat  A fast heartbeat  A bad rash all over body  Dizziness and weakness

## 2021-04-28 ENCOUNTER — Encounter: Payer: Self-pay | Admitting: Pulmonary Disease

## 2021-04-28 ENCOUNTER — Other Ambulatory Visit (HOSPITAL_BASED_OUTPATIENT_CLINIC_OR_DEPARTMENT_OTHER): Payer: Self-pay

## 2021-04-28 MED ORDER — COVID-19MRNA BIVAL VACC PFIZER 30 MCG/0.3ML IM SUSP
INTRAMUSCULAR | 0 refills | Status: DC
  Filled 2021-04-28: qty 0.3, 1d supply, fill #0

## 2021-05-03 ENCOUNTER — Ambulatory Visit (INDEPENDENT_AMBULATORY_CARE_PROVIDER_SITE_OTHER): Payer: Medicare PPO

## 2021-05-03 ENCOUNTER — Other Ambulatory Visit: Payer: Self-pay

## 2021-05-03 VITALS — BP 112/72 | HR 73 | Temp 98.9°F | Resp 18 | Ht 64.0 in | Wt 183.8 lb

## 2021-05-03 DIAGNOSIS — J4550 Severe persistent asthma, uncomplicated: Secondary | ICD-10-CM

## 2021-05-03 MED ORDER — EPINEPHRINE 0.3 MG/0.3ML IJ SOAJ: 0.3000 mg | Freq: Once | INTRAMUSCULAR | Status: DC | PRN

## 2021-05-03 MED ORDER — DIPHENHYDRAMINE HCL 50 MG/ML IJ SOLN: 50.0000 mg | Freq: Once | INTRAMUSCULAR | Status: DC | PRN

## 2021-05-03 MED ORDER — SODIUM CHLORIDE 0.9 % IV SOLN: Freq: Once | INTRAVENOUS | Status: DC | PRN

## 2021-05-03 MED ORDER — BENRALIZUMAB 30 MG/ML ~~LOC~~ SOSY
30.0000 mg | PREFILLED_SYRINGE | Freq: Once | SUBCUTANEOUS | Status: AC
  Administered 2021-05-03: 30 mg via SUBCUTANEOUS

## 2021-05-03 MED ORDER — METHYLPREDNISOLONE SODIUM SUCC 125 MG IJ SOLR: 125.0000 mg | Freq: Once | INTRAMUSCULAR | Status: DC | PRN

## 2021-05-03 MED ORDER — FAMOTIDINE IN NACL 20-0.9 MG/50ML-% IV SOLN: 20.0000 mg | Freq: Once | INTRAVENOUS | Status: DC | PRN

## 2021-05-03 MED ORDER — ALBUTEROL SULFATE HFA 108 (90 BASE) MCG/ACT IN AERS: 2.0000 | INHALATION_SPRAY | Freq: Once | RESPIRATORY_TRACT | Status: DC | PRN

## 2021-05-03 NOTE — Progress Notes (Signed)
Diagnosis: Asthma  Provider:  Praveen Mannam, MD  Procedure: Injection  Fasenra (Benralizumab), Dose: 30 mg, Site: subcutaneous  Discharge: Condition: Good, Destination: Home . AVS provided to patient.   Performed by:  Kadiatou Oplinger, RN        

## 2021-05-26 ENCOUNTER — Other Ambulatory Visit: Payer: Self-pay | Admitting: Pulmonary Disease

## 2021-06-11 ENCOUNTER — Telehealth: Payer: Self-pay | Admitting: Pulmonary Disease

## 2021-06-11 MED ORDER — BENZONATATE 200 MG PO CAPS: 200.0000 mg | ORAL_CAPSULE | Freq: Three times a day (TID) | ORAL | 1 refills | Status: DC | PRN

## 2021-06-11 NOTE — Telephone Encounter (Signed)
LMTCB   Will confirm pharmacy then send medication.

## 2021-06-11 NOTE — Telephone Encounter (Signed)
Please send script for benzonatate 200 mg tid prn, #30 with 1 refill.  If her cough persists, then she needs an ROV with me or NP.

## 2021-06-11 NOTE — Telephone Encounter (Signed)
Call made to patient, made aware of VS recommendations. Voiced understanding.   Nothing further needed at this time.  

## 2021-06-11 NOTE — Telephone Encounter (Signed)
Call made to patient, confirmed DOB. Patient voices having developed a cough for 3 weeks. She has not taken anything OTC for cough. She reports having a pinched nerve and she is taking gabapentin for that. She states when she is not sleeping she is coughing. She confirms she is using her Symbicort daily and albuterol as needed. She has not been using her Flonase. Denies fever, runny nose, congestion, chills, sweats, or body aches. She states very seldom can she cough anything up and when she does it is a minimal amount.   VS please advise. Thanks :)

## 2021-06-28 ENCOUNTER — Other Ambulatory Visit: Payer: Self-pay

## 2021-06-28 ENCOUNTER — Ambulatory Visit (INDEPENDENT_AMBULATORY_CARE_PROVIDER_SITE_OTHER): Payer: Medicare PPO

## 2021-06-28 VITALS — BP 126/77 | HR 77 | Temp 97.9°F | Ht 64.0 in | Wt 181.6 lb

## 2021-06-28 DIAGNOSIS — J4550 Severe persistent asthma, uncomplicated: Secondary | ICD-10-CM | POA: Diagnosis not present

## 2021-06-28 MED ORDER — DIPHENHYDRAMINE HCL 50 MG/ML IJ SOLN: 50.0000 mg | Freq: Once | INTRAMUSCULAR | Status: DC | PRN

## 2021-06-28 MED ORDER — SODIUM CHLORIDE 0.9 % IV SOLN: Freq: Once | INTRAVENOUS | Status: DC | PRN

## 2021-06-28 MED ORDER — EPINEPHRINE 0.3 MG/0.3ML IJ SOAJ: 0.3000 mg | Freq: Once | INTRAMUSCULAR | Status: DC | PRN

## 2021-06-28 MED ORDER — ALBUTEROL SULFATE HFA 108 (90 BASE) MCG/ACT IN AERS: 2.0000 | INHALATION_SPRAY | Freq: Once | RESPIRATORY_TRACT | Status: DC | PRN

## 2021-06-28 MED ORDER — FAMOTIDINE IN NACL 20-0.9 MG/50ML-% IV SOLN: 20.0000 mg | Freq: Once | INTRAVENOUS | Status: DC | PRN

## 2021-06-28 MED ORDER — BENRALIZUMAB 30 MG/ML ~~LOC~~ SOSY
30.0000 mg | PREFILLED_SYRINGE | Freq: Once | SUBCUTANEOUS | Status: AC
  Administered 2021-06-28: 13:00:00 30 mg via SUBCUTANEOUS

## 2021-06-28 MED ORDER — METHYLPREDNISOLONE SODIUM SUCC 125 MG IJ SOLR: 125.0000 mg | Freq: Once | INTRAMUSCULAR | Status: DC | PRN

## 2021-06-28 NOTE — Progress Notes (Signed)
Diagnosis: Asthma  Provider:  Praveen Mannam, MD  Procedure: Injection  Fasenra (Benralizumab), Dose: 30 mg, Site: subcutaneous  Discharge: Condition: Good, Destination: Home . AVS provided to patient.   Performed by:  Anthonia Monger, RN        

## 2021-07-08 ENCOUNTER — Telehealth: Payer: Self-pay | Admitting: Pharmacy Technician

## 2021-07-08 ENCOUNTER — Telehealth: Payer: Self-pay | Admitting: Pulmonary Disease

## 2021-07-08 NOTE — Telephone Encounter (Signed)
Primary Pulmonologist: Sood Last office visit and with whom: 02/12/2021 [facility] What do we see them for (pulmonary problems): cough, COPD with Asthma Last OV assessment/plan:  Assessment/Plan:    COPD with asthma and eosinophilic phenotype, and chronic bronchitis. - intolerant of xolair in 2010 - daliresp ineffective - started fasenra September 2019 and has tolerated this well - continue spiriva, symbicort, singulair - advised her to try using albuterol more often when she is having symptoms - prn mucinex - will arrange for flutter valve - chest xray today   Allergic rhinitis. - continue fasenra, singulair - prn flonase   Time Spent Involved in Patient Care on Day of Examination:  24 minutes   Follow up:    Patient Instructions  Chest xray today   Will arrange for flutter valve - can use this twice per day as needed to help loosen chest congestion   Follow up in 1 year   Medication List:    Allergies as of 02/12/2021         Reactions    Zoledronic Acid Other (See Comments)    Other reaction(s): Tachycardia, patient provider will decide if she had a true allergy.             Medication List           Accurate as of February 12, 2021  4:27 PM. If you have any questions, ask your nurse or doctor.              albuterol 108 (90 Base) MCG/ACT inhaler Commonly known as: ProAir HFA Inhale 2 puffs into the lungs every 6 (six) hours as needed.    budesonide-formoterol 160-4.5 MCG/ACT inhaler Commonly known as: SYMBICORT Inhale 2 puffs into the lungs 2 (two) times daily.    CULTURELLE PO Take by mouth.    escitalopram 5 MG tablet Commonly known as: LEXAPRO Take 5 mg by mouth daily.    Fasenra 30 MG/ML Sosy Generic drug: Benralizumab Inject 30 mg into the skin every 8 (eight) weeks.    fluticasone 50 MCG/ACT nasal spray Commonly known as: FLONASE Place 2 sprays into both nostrils daily.    guaiFENesin 600 MG 12 hr tablet Commonly known as: MUCINEX Take  1,200 mg by mouth daily.    levothyroxine 75 MCG tablet Commonly known as: SYNTHROID Take 1 tablet by mouth daily.    Magnesium 100 MG Caps Take by mouth.    montelukast 10 MG tablet Commonly known as: SINGULAIR Take 1 tablet (10 mg total) by mouth at bedtime.    omeprazole 20 MG capsule Commonly known as: PRILOSEC TAKE 1 CAPSULE BY MOUTH TWICE DAILY 30 MINUTES BEFORE FIRST AND LAST MEAL    simvastatin 10 MG tablet Commonly known as: ZOCOR Take 10 mg by mouth daily.    Spiriva HandiHaler 18 MCG inhalation capsule Generic drug: tiotropium INHALE 1 CAPSULE VIA INHALER EVERY DAY    theophylline 100 MG 24 hr capsule Commonly known as: Theo-24 Take 1 capsule (100 mg total) by mouth daily.    Was appointment offered to patient (explain)?  no   Reason for call:   Called and spoke with patient, she states that her tongue is sore like she has bitten it, but she has not.  She brought this to the attention of her dentist and she was told that it was likely her inhalers.  She has been drinking water after using her Symbicort and Spiriva, but has not been rinsing, gargling and spitting.  She has used  warm salt water rinses, but was asking if there is anything else she can be doing.  Educated her on rinsing, gargling and spitting after using each inhaler.  Advised I would make Dr. Craige Cotta aware.  Dr. Craige Cotta, please advise.  Thank you.  (examples of things to ask: : When did symptoms start? Fever? Cough? Productive? Color to sputum? More sputum than usual? Wheezing? Have you needed increased oxygen? Are you taking your respiratory medications? What over the counter measures have you tried?)  Allergies  Allergen Reactions   Zoledronic Acid Other (See Comments)    Other reaction(s): Tachycardia, patient provider will decide if she had a true allergy.     Immunization History  Administered Date(s) Administered   Fluad Quad(high Dose 65+) 03/15/2019   Influenza Split 04/02/2011, 04/01/2015    Influenza Whole 04/01/1997, 05/01/2012, 03/15/2014   Influenza, High Dose Seasonal PF 04/05/2017, 03/07/2018, 03/15/2019   Influenza,inj,Quad PF,6+ Mos 03/07/2016   Influenza-Unspecified 04/05/2017   Moderna Sars-Covid-2 Vaccination 09/02/2019, 09/30/2019   Pfizer Covid-19 Vaccine Bivalent Booster 52yrs & up 04/22/2021   Pneumococcal Conjugate-13 06/03/2015   Pneumococcal Polysaccharide-23 05/04/2007   Td 04/01/1997

## 2021-07-08 NOTE — Telephone Encounter (Signed)
Called and spoke with Patient. Dr. Sood's recommendations given. Understanding stated. Nothing further at this time. 

## 2021-07-08 NOTE — Telephone Encounter (Signed)
Agree she needs to rinse mouth consistently after using her inhalers.  She can try biotene mouth rinse.

## 2021-07-08 NOTE — Telephone Encounter (Addendum)
Auth Submission: PA RENEWAL - PENDING Payer: [employer] Medication & CPT/J Code(s) submitted: Harrington Challenger (Benralizumab) [phone] Route of submission (phone, fax, portal): COVER MY MEDS Auth type: Buy/Bill Units/visits requested: 30 MG Q56D Reference number: [id number]: [id number]   Will update once we receive a response.

## 2021-07-09 NOTE — Telephone Encounter (Signed)
Auth Submission: APPROVED Payer: [employer] Medication & CPT/J Code(s) submitted: Fasenra (Benralizumab) [phone] Route of submission (phone, fax, portal): COVER MY MEDS Auth type: Buy/Bill Units/visits requested: 30 MG Q56D Reference number: [id number] Approval from: 09/30/20 to 07/31/22

## 2021-07-27 ENCOUNTER — Telehealth: Payer: Self-pay

## 2021-07-27 ENCOUNTER — Ambulatory Visit: Payer: Medicare PPO

## 2021-07-28 ENCOUNTER — Other Ambulatory Visit: Payer: Self-pay | Admitting: Pharmacy Technician

## 2021-07-29 ENCOUNTER — Ambulatory Visit: Payer: Medicare PPO

## 2021-07-30 ENCOUNTER — Ambulatory Visit (INDEPENDENT_AMBULATORY_CARE_PROVIDER_SITE_OTHER): Payer: Medicare PPO

## 2021-07-30 ENCOUNTER — Other Ambulatory Visit: Payer: Self-pay

## 2021-07-30 VITALS — BP 121/72 | HR 88 | Temp 98.2°F | Resp 18 | Ht 64.0 in | Wt 186.4 lb

## 2021-07-30 DIAGNOSIS — J4550 Severe persistent asthma, uncomplicated: Secondary | ICD-10-CM | POA: Diagnosis not present

## 2021-07-30 MED ORDER — EPINEPHRINE 0.3 MG/0.3ML IJ SOAJ: 0.3000 mg | Freq: Once | INTRAMUSCULAR | Status: DC | PRN

## 2021-07-30 MED ORDER — BENRALIZUMAB 30 MG/ML ~~LOC~~ SOSY
30.0000 mg | PREFILLED_SYRINGE | Freq: Once | SUBCUTANEOUS | Status: AC
  Administered 2021-07-30: 11:00:00 30 mg via SUBCUTANEOUS

## 2021-07-30 MED ORDER — METHYLPREDNISOLONE SODIUM SUCC 125 MG IJ SOLR: 125.0000 mg | Freq: Once | INTRAMUSCULAR | Status: DC | PRN

## 2021-07-30 MED ORDER — SODIUM CHLORIDE 0.9 % IV SOLN: Freq: Once | INTRAVENOUS | Status: DC | PRN

## 2021-07-30 MED ORDER — ALBUTEROL SULFATE HFA 108 (90 BASE) MCG/ACT IN AERS: 2.0000 | INHALATION_SPRAY | Freq: Once | RESPIRATORY_TRACT | Status: DC | PRN

## 2021-07-30 MED ORDER — DIPHENHYDRAMINE HCL 50 MG/ML IJ SOLN: 50.0000 mg | Freq: Once | INTRAMUSCULAR | Status: DC | PRN

## 2021-07-30 MED ORDER — FAMOTIDINE IN NACL 20-0.9 MG/50ML-% IV SOLN: 20.0000 mg | Freq: Once | INTRAVENOUS | Status: DC | PRN

## 2021-07-30 NOTE — Progress Notes (Signed)
Diagnosis: Asthma  Provider:  Praveen Mannam, MD  Procedure: Injection  Fasenra (Benralizumab), Dose: 30 mg, Site: subcutaneous, Number of injections: 1  Discharge: Condition: Good, Destination: Home . AVS provided to patient.   Performed by:  Charleston Hankin, RN       

## 2021-08-12 ENCOUNTER — Other Ambulatory Visit: Payer: Self-pay | Admitting: *Deleted

## 2021-08-12 MED ORDER — MONTELUKAST SODIUM 10 MG PO TABS: 10.0000 mg | ORAL_TABLET | Freq: Every day | ORAL | 3 refills | Status: DC

## 2021-08-17 ENCOUNTER — Other Ambulatory Visit: Payer: Self-pay | Admitting: *Deleted

## 2021-08-17 MED ORDER — SPIRIVA HANDIHALER 18 MCG IN CAPS: ORAL_CAPSULE | RESPIRATORY_TRACT | 3 refills | Status: DC

## 2021-08-20 IMAGING — DX DG CHEST 2V
2 series · 2 of 2 positions shown · non-contrast
Comparison: 07/11/2018

CLINICAL DATA: Cough, COPD, asthma

EXAM:
CHEST - 2 VIEW

[chest pa]
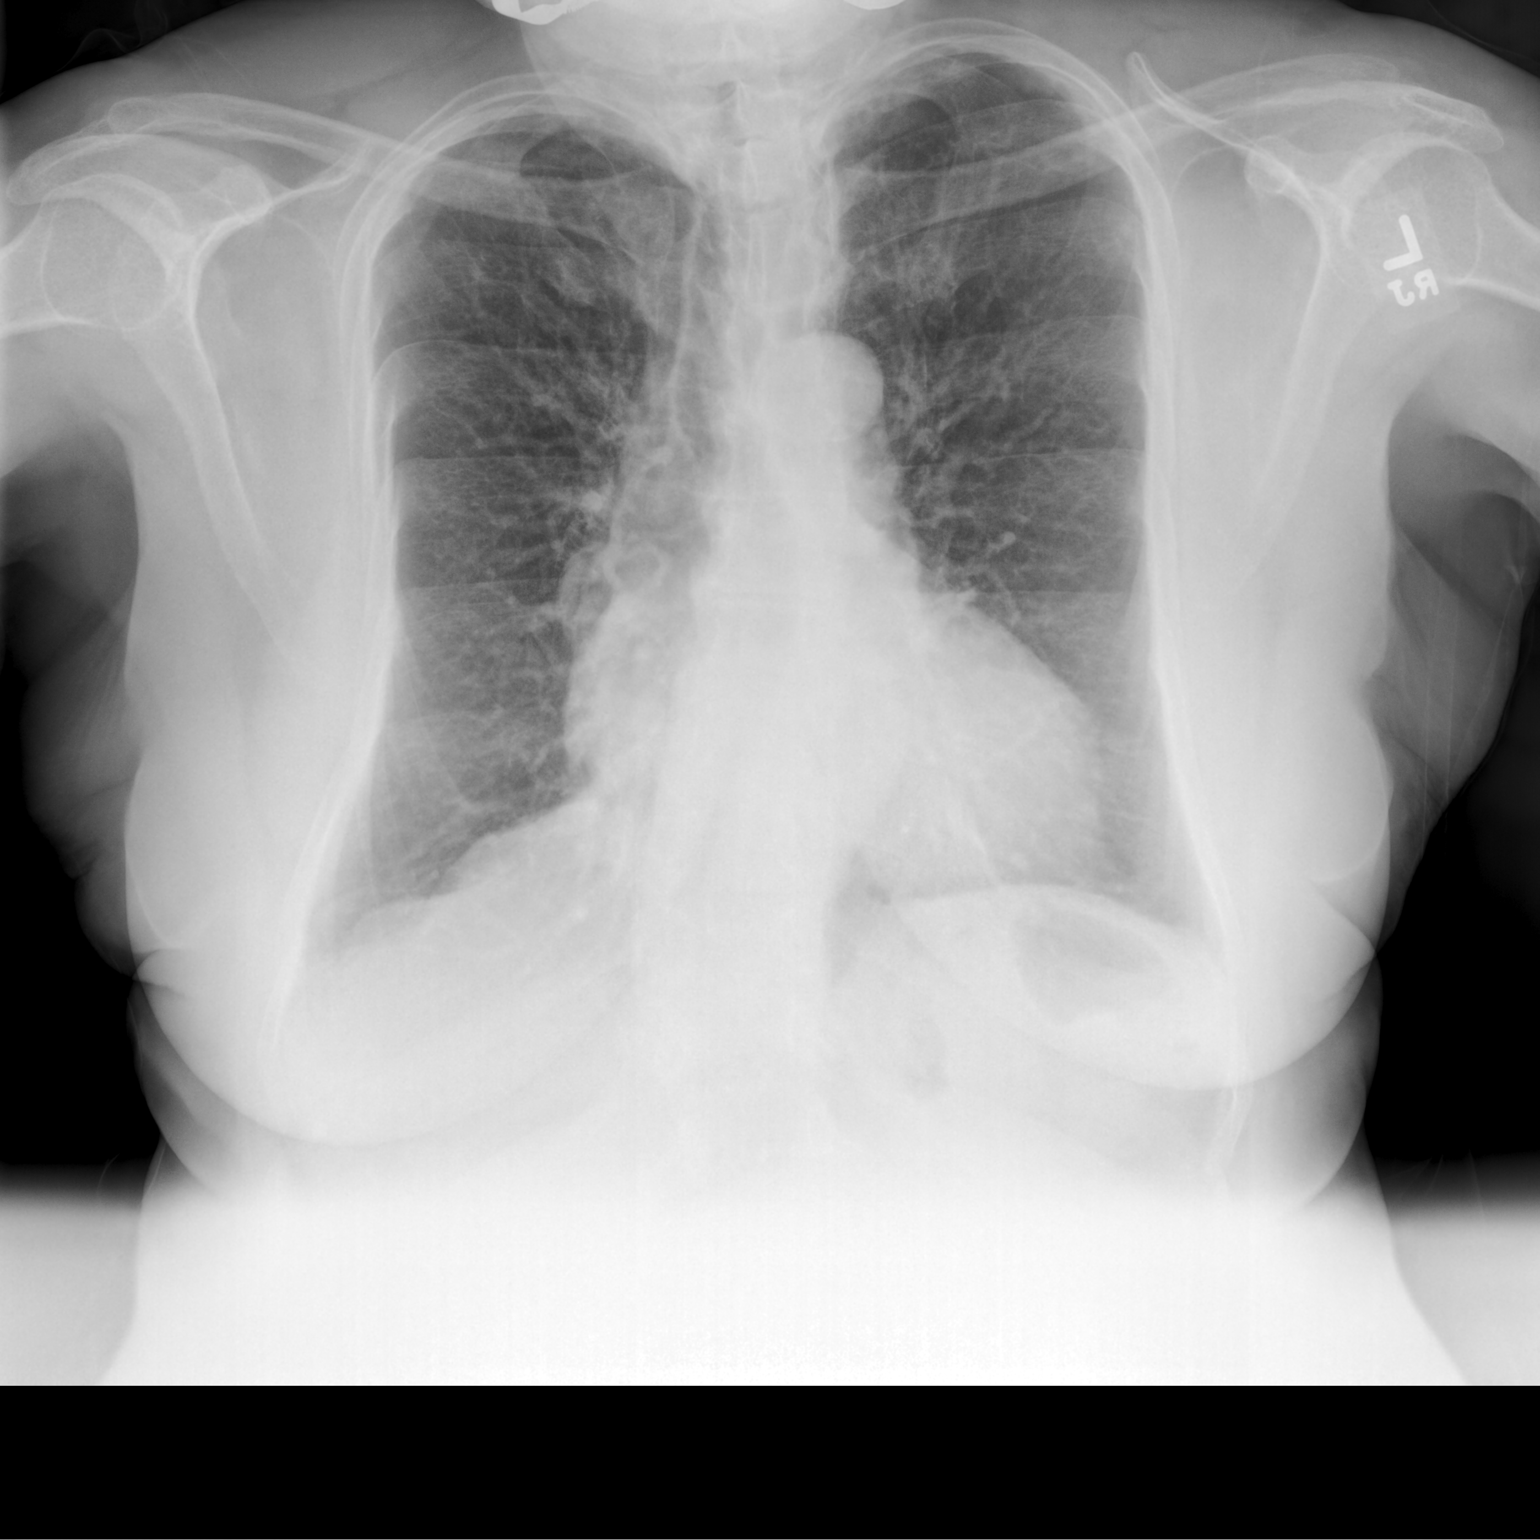

[chest lat]
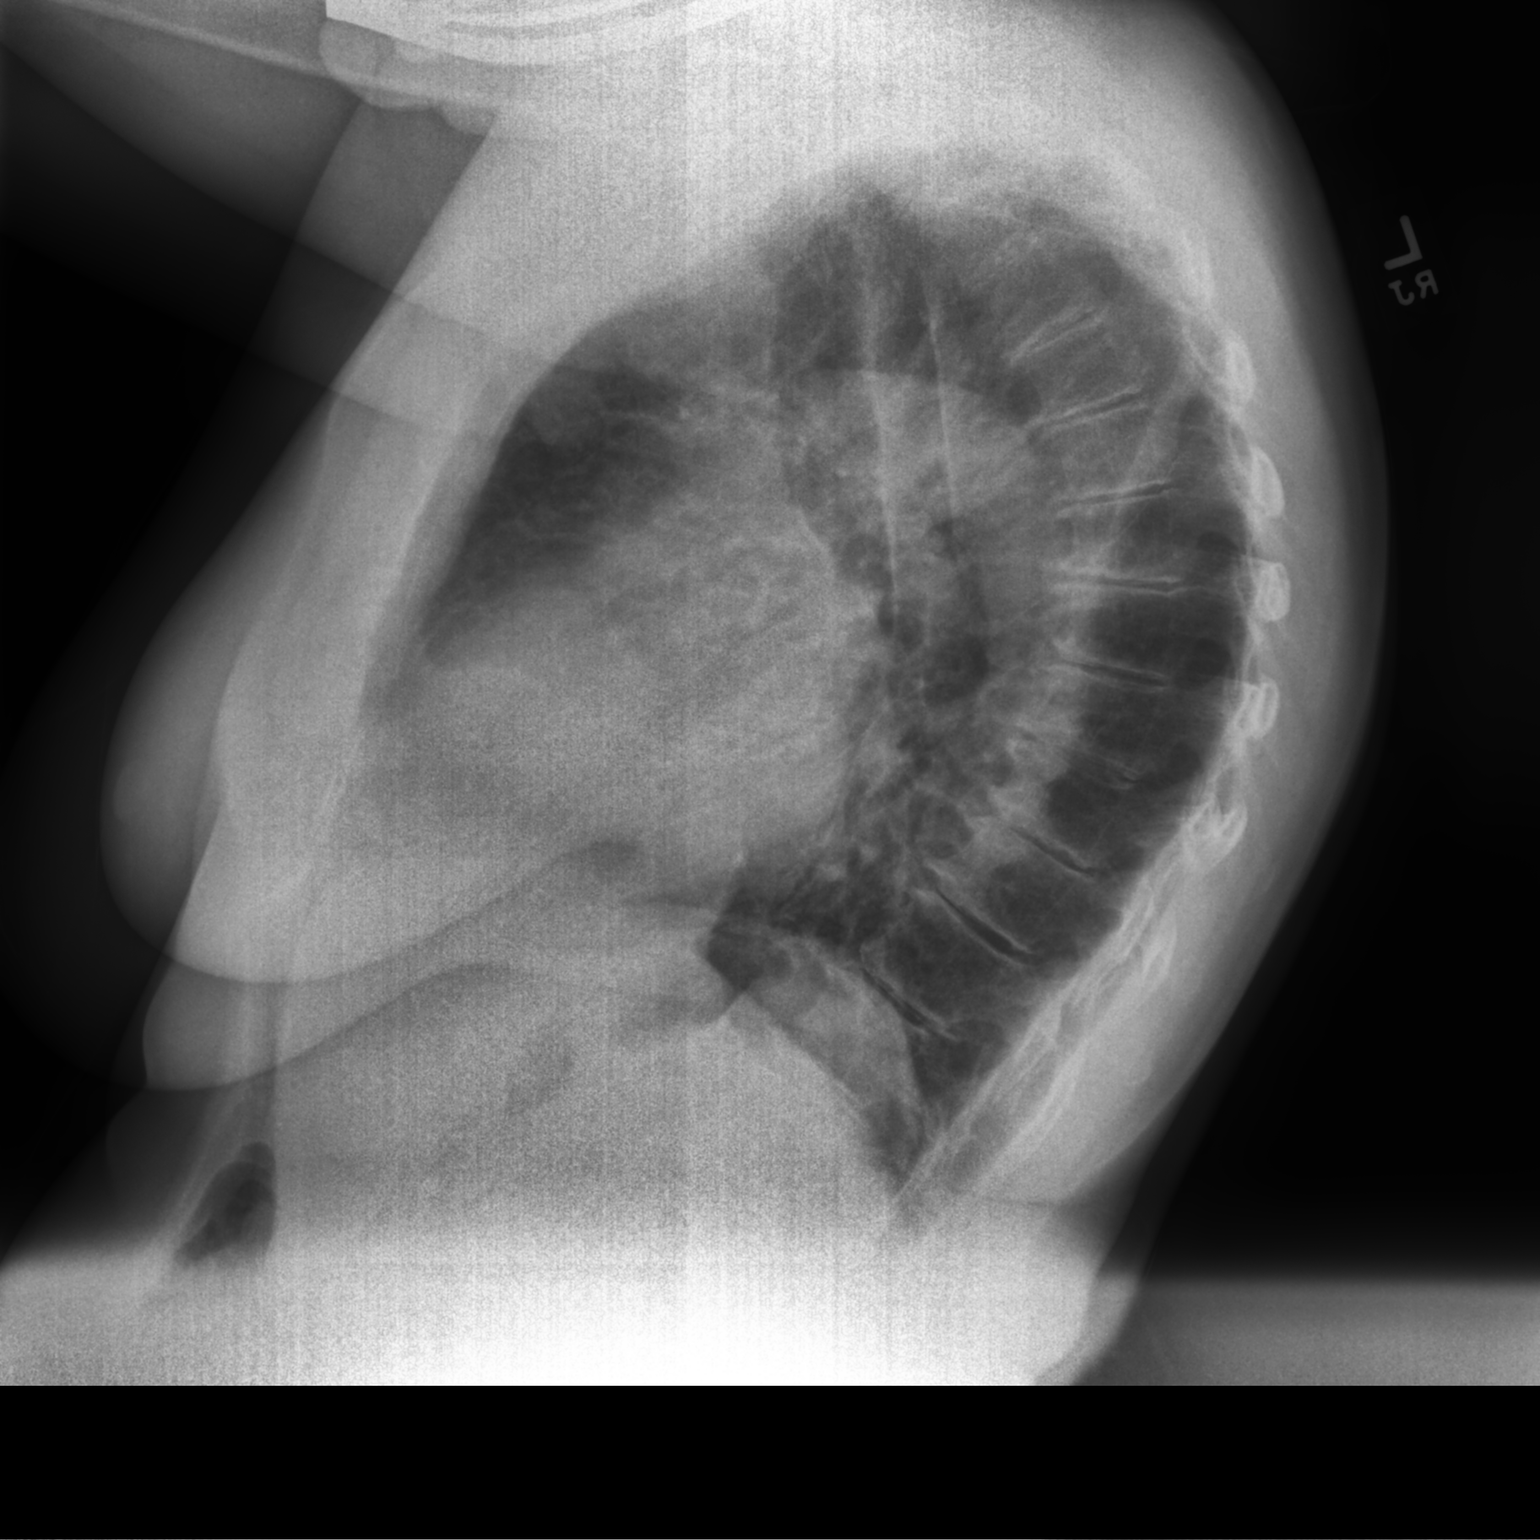

[2 of 2 positions shown; findings below may reference images not displayed]

FINDINGS: Stable cardiomegaly with central vascular congestion. Chronic apical
scarring. No focal pneumonia, collapse or consolidation. Negative
for edema, effusion or pneumothorax. Trachea midline. Degenerative
changes of the spine. Aorta atherosclerotic tortuous.
IMPRESSION: Cardiomegaly without acute chest process.  Stable exam.

## 2021-08-30 ENCOUNTER — Other Ambulatory Visit: Payer: Self-pay | Admitting: Pharmacy Technician

## 2021-09-14 ENCOUNTER — Telehealth: Payer: Self-pay | Admitting: Pulmonary Disease

## 2021-09-14 NOTE — Telephone Encounter (Signed)
Called and spoke to pt. Pt states she has had an increase in prod cough with white mucus x 2 weeks. Pt denies ShOB, f/c/s, CP/tightness. Pt last seen in 01/2021 by Dr. Halford Chessman for asthma. First available appt with our office in [place] in 2/16. Pt states she is comfortable waiting until that appt to be seen. She felt because she wasn't ShOB she didn't think she needed an OV. Advised pt to call our office if she has any new or worsening s/s. Appt has been schedule with Roxan Diesel, NP, for 2/16.

## 2021-09-16 ENCOUNTER — Encounter: Payer: Self-pay | Admitting: Nurse Practitioner

## 2021-09-16 ENCOUNTER — Other Ambulatory Visit: Payer: Self-pay

## 2021-09-16 ENCOUNTER — Ambulatory Visit: Payer: Medicare PPO | Admitting: Nurse Practitioner

## 2021-09-16 ENCOUNTER — Ambulatory Visit (INDEPENDENT_AMBULATORY_CARE_PROVIDER_SITE_OTHER): Payer: Medicare PPO

## 2021-09-16 VITALS — BP 110/70 | HR 72 | Temp 98.1°F | Ht 64.0 in | Wt 181.0 lb

## 2021-09-16 DIAGNOSIS — J3089 Other allergic rhinitis: Secondary | ICD-10-CM | POA: Diagnosis not present

## 2021-09-16 DIAGNOSIS — J449 Chronic obstructive pulmonary disease, unspecified: Secondary | ICD-10-CM

## 2021-09-16 DIAGNOSIS — J45901 Unspecified asthma with (acute) exacerbation: Secondary | ICD-10-CM

## 2021-09-16 DIAGNOSIS — J441 Chronic obstructive pulmonary disease with (acute) exacerbation: Secondary | ICD-10-CM | POA: Diagnosis not present

## 2021-09-16 LAB — NITRIC OXIDE: Nitric Oxide: 12

## 2021-09-16 MED ORDER — PREDNISONE 20 MG PO TABS: 20.0000 mg | ORAL_TABLET | Freq: Every day | ORAL | 0 refills | Status: AC

## 2021-09-16 MED ORDER — AZITHROMYCIN 250 MG PO TABS: ORAL_TABLET | ORAL | 0 refills | Status: DC

## 2021-09-16 MED ORDER — BENZONATATE 200 MG PO CAPS: 200.0000 mg | ORAL_CAPSULE | Freq: Three times a day (TID) | ORAL | 1 refills | Status: DC | PRN

## 2021-09-16 MED ORDER — FLUTICASONE PROPIONATE 50 MCG/ACT NA SUSP: 2.0000 | Freq: Every day | NASAL | 3 refills | Status: DC

## 2021-09-16 NOTE — Progress Notes (Signed)
Lungs clear. Normal exam.

## 2021-09-16 NOTE — Patient Instructions (Addendum)
Continue Symbicort 2 puffs Twice daily. Brush tongue and rinse mouth afterwards Continue Spiriva 1 puff daily  Continue Albuterol inhaler 2 puffs every 6 hours as needed for shortness of breath or wheezing. Notify if symptoms persist despite rescue inhaler/neb use. Restart flonase nasal spray 2 sprays each nostril daily Continue mucinex 1200 mg Twice daily for chest congestion Continue singulair 10 mg At bedtime  Continue prilosec 20 mg Twice daily   -Prednisone 20 mg for 5 days. Take in AM with food. -Z pack (azithromycin). Take 2 tabs on day one followed by 1 tab for four additional days.  Chest x ray today. We will notify you of any abnormal results  Asthma Action Plan in place Rinse mouth after inhaled corticosteroid use.  Avoid triggers, when able.  Exercise encouraged. Notify if worsening symptoms upon exertion.  Notify and seek help if symptoms unrelieved by rescue inhaler.  Follow up in one month with Dr. Craige Cotta or Philis Nettle. If symptoms do not improve or worsen, please contact office for sooner follow up or seek emergency care.

## 2021-09-16 NOTE — Assessment & Plan Note (Addendum)
Worsening symptoms; possible URI vs allergies. COVID neg. Restart flonase nasal spray. Continue singulair, OTC antihistamine, and Fasenra.

## 2021-09-16 NOTE — Progress Notes (Signed)
@Patient  ID: Mary [name], female    DOB: [birth year], 71 y.o.   MRN: [id number]  Chief Complaint  Patient presents with   Acute Visit    Strangling cough with white mucous for 2-3 weeks.  The tessalon has been helping.  Has had chills but no fever.  3 negative covid tests, last one last pm.  Cough sounds congestion.  Has nasal congestion.    Referring provider: Glendon Axe, MD  HPI: 71 year old female, former smoker (10 pack years) followed for COPD/asthma overlap with eosinophilic phenotype, allergic rhinitis, exercise induced bronchoconstriction, and upper airway cough syndrome. She is a patient of Dr. Juanetta Gosling and last seen in office 02/09/2021. Past medical history significant for hypothyroidism, esophageal stricture, GERd, HLD, osteoporosis, colon polyp, nephrolithiasis, b/l hydronephrosis.   TEST/EVENTS:  02/28/2008 RAST: IgE 327.6, multiple allergens 02/28/2008 PFTs: FEV1 1.44 (58), FEV1% 48, TLC 5.25 (99%), DLCO 76% 02/05/2010 PFTs: FEV1 1.44 (60), FEV1% 51, TLC 5.22 (99), DLCO 78%, positive BD 09/12/2011 spirometry: FEV1 1.28 (60%), FEV1% 55 04/18/2014 CT chest: Centrilobular emphysema 12/04/2014 spirometry: FEV1 1.22 (62), FEV1% 52 02/03/2017 RAST: Dog, cat, dust mite, mold, tree bark; IgE 333 02/03/2017 spirometry: FEV1 1.2 (61), FEV1% 54 02/04/2019 IgE: 344 02/12/2021 CXR 2 view: Apical scarring.  Stable cardiomegaly with central vascular congestion.  Atherosclerosis.  No acute process.  02/12/2021: OV with Dr. Halford Chessman. Improved after starting Spiriva. Recently getting more chest congestion with cough. Previously intolerant of Xolair in 2010. Daliresp ineffective. Started Fasenra in Sept. 2019 and tolerated well. Continued on Spiriva, Symbicort and Singulair. PRN mucinex and flutter valve. CXR with chronic apical scarring and central vascular congestion without other significant findings.   09/16/2021: Today-acute visit Patient presents today for persistent cough and increased shortness  of breath over the last 2 to 3 weeks.  She describes her cough as productive with white sputum production.  She has had some associated nasal congestion, increased shortness of breath with exertion and occasional wheeze.  She denies any recent fever but has had some chills.  She has taken 3 COVID test which is all been negative most recently last night. She denies orthopnea, PND, chest pain or lower extremity swelling. She denies any recent sick exposures. She continues on Symbicort Twice daily and Spiriva daily, singulair, and Fasenra injections. She uses over the counter allergy medicine daily. She felt some relief in her cough with tessalon perles but has run out. She continues on mucinex.   Allergies  Allergen Reactions   Zoledronic Acid Other (See Comments)    Other reaction(s): Tachycardia, patient provider will decide if she had a true allergy.     Immunization History  Administered Date(s) Administered   Fluad Quad(high Dose 65+) 03/15/2019, 04/30/2021   Influenza Split 04/02/2011, 04/01/2015   Influenza Whole 04/01/1997, 05/01/2012, 03/15/2014   Influenza, High Dose Seasonal PF 04/05/2017, 03/07/2018, 03/15/2019   Influenza,inj,Quad PF,6+ Mos 03/07/2016   Influenza-Unspecified 04/05/2017   Moderna Sars-Covid-2 Vaccination 09/02/2019, 09/30/2019   Pfizer Covid-19 Vaccine Bivalent Booster 39yrs & up 04/22/2021   Pneumococcal Conjugate-13 06/03/2015   Pneumococcal Polysaccharide-23 05/04/2007   Td 04/01/1997    Past Medical History:  Diagnosis Date   Allergic rhinitis    Chronic obstructive asthma    Colon polyp    Esophageal stricture    Exercise-induced bronchoconstriction    GERD (gastroesophageal reflux disease)    Hypothyroidism    Osteoporosis    Pneumonia     Tobacco History: Social History   Tobacco Use  Smoking  Status Former   Packs/day: 1.00   Years: 10.00   Pack years: 10.00   Types: Cigarettes   Quit date: 08/02/1979   Years since quitting: 42.1   Smokeless Tobacco Never   Counseling given: Not Answered   Outpatient Medications Prior to Visit  Medication Sig Dispense Refill   albuterol (PROAIR HFA) 108 (90 Base) MCG/ACT inhaler Inhale 2 puffs into the lungs every 6 (six) hours as needed. 18 g 3   Benralizumab (FASENRA) 30 MG/ML SOSY Inject 30 mg into the skin every 8 (eight) weeks.     COVID-19 mRNA bivalent vaccine, Pfizer, injection Inject into the muscle. 0.3 mL 0   escitalopram (LEXAPRO) 5 MG tablet Take 5 mg by mouth daily.     guaiFENesin (MUCINEX) 600 MG 12 hr tablet Take 1,200 mg by mouth daily.      Lactobacillus Rhamnosus, GG, (CULTURELLE PO) Take by mouth.     levothyroxine (SYNTHROID, LEVOTHROID) 75 MCG tablet Take 1 tablet by mouth daily.  3   Magnesium 100 MG CAPS Take by mouth.     montelukast (SINGULAIR) 10 MG tablet Take 1 tablet (10 mg total) by mouth at bedtime. 90 tablet 3   omeprazole (PRILOSEC) 20 MG capsule TAKE 1 CAPSULE BY MOUTH TWICE DAILY 30 MINUTES BEFORE FIRST AND LAST MEAL 60 capsule 0   pregabalin (LYRICA) 75 MG capsule Take 150 mg by mouth 2 (two) times daily.     simvastatin (ZOCOR) 10 MG tablet Take 10 mg by mouth daily.     theophylline (THEO-24) 100 MG 24 hr capsule TAKE ONE CAPSULE BY MOUTH DAILY 90 capsule 1   tiotropium (SPIRIVA HANDIHALER) 18 MCG inhalation capsule INHALE 1 CAPSULE VIA INHALER EVERY DAY 90 capsule 3   fluticasone (FLONASE) 50 MCG/ACT nasal spray Place 2 sprays into both nostrils daily. 16 g 5   budesonide-formoterol (SYMBICORT) 160-4.5 MCG/ACT inhaler Inhale 2 puffs into the lungs 2 (two) times daily. 30.6 g 2   benzonatate (TESSALON) 200 MG capsule Take 1 capsule (200 mg total) by mouth 3 (three) times daily as needed for cough. (Patient not taking: Reported on 09/16/2021) 30 capsule 1   No facility-administered medications prior to visit.     Review of Systems:   Constitutional: No weight loss or gain, night sweats, fevers, chills, fatigue, or lassitude. HEENT: No  headaches, difficulty swallowing, tooth/dental problems, or sore throat. No sneezing, itching, ear ache. +nasal congestion CV:  No chest pain, orthopnea, PND, swelling in lower extremities, anasarca, dizziness, palpitations, syncope Resp: +shortness of breath with exertion/coughing spells; productive cough; occasional wheeze. No excess mucus or change in color of mucus. No hemoptysis. No chest wall deformity GI:  No heartburn, indigestion, abdominal pain, nausea, vomiting, diarrhea, change in bowel habits, loss of appetite, bloody stools.  GU: No dysuria, change in color of urine, urgency or frequency.  No flank pain, no hematuria  Skin: No rash, lesions, ulcerations MSK:  No joint pain or swelling.  No decreased range of motion.  No back pain. Neuro: No dizziness or lightheadedness.  Psych: No depression or anxiety. Mood stable.     Physical Exam:  BP 110/70 (BP Location: Right Arm, Patient Position: Sitting, Cuff Size: Normal)    Pulse 72    Temp 98.1 F (36.7 C) (Oral)    Ht 5\' 4"  (1.626 m)    Wt 181 lb (82.1 kg)    SpO2 97%    BMI 31.07 kg/m   GEN: Pleasant, interactive, well-kempt; obese; in no acute  distress. HEENT:  Normocephalic and atraumatic. EACs patent bilaterally. TM pearly gray with present light reflex bilaterally. PERRLA. Sclera white. Nasal turbinates pink, moist and patent bilaterally. No rhinorrhea present. Oropharynx erythematous and moist, without exudate or edema. No lesions, ulcerations NECK:  Supple w/ fair ROM. No JVD present. Normal carotid impulses w/o bruits. Thyroid symmetrical with no goiter or nodules palpated. No lymphadenopathy.   CV: RRR, no m/r/g, no peripheral edema. Pulses intact, +2 bilaterally. No cyanosis, pallor or clubbing. PULMONARY:  Unlabored, regular breathing. Scattered rhonchi bilaterally A&P. Bronchitic, paroxysmal cough. No accessory muscle use. No dullness to percussion. GI: BS present and normoactive. Soft, non-tender to palpation. No  organomegaly or masses detected. No CVA tenderness. MSK: No erythema, warmth or tenderness. Cap refil <2 sec all extrem. No deformities or joint swelling noted.  Neuro: A/Ox3. No focal deficits noted.   Skin: Warm, no lesions or rashe Psych: Normal affect and behavior. Judgement and thought content appropriate.     Lab Results:  CBC    Component Value Date/Time   WBC 9.7 02/04/2019 1626   RBC 4.08 02/04/2019 1626   HGB 12.7 02/04/2019 1626   HCT 38.4 02/04/2019 1626   PLT 329.0 02/04/2019 1626   MCV 94.1 02/04/2019 1626   MCH 29.4 07/11/2018 1902   MCHC 33.1 02/04/2019 1626   RDW 13.8 02/04/2019 1626   LYMPHSABS 3.6 02/04/2019 1626   MONOABS 0.7 02/04/2019 1626   EOSABS 0.0 02/04/2019 1626   BASOSABS 0.1 02/04/2019 1626    BMET    Component Value Date/Time   NA 138 07/11/2018 1902   K 3.9 07/11/2018 1902   CL 106 07/11/2018 1902   CO2 24 07/11/2018 1902   GLUCOSE 109 (H) 07/11/2018 1902   BUN 14 07/11/2018 1902   CREATININE 0.82 07/11/2018 1902   CALCIUM 9.6 07/11/2018 1902   GFRNONAA >60 07/11/2018 1902   GFRAA >60 07/11/2018 1902    BNP No results found for: BNP   Imaging:  No results found.  Benralizumab SOSY 30 mg     Date Action Dose Route User   07/30/2021 1032 Given 30 mg Subcutaneous (Right Arm) Ranabhat, Sabitri, RN       No flowsheet data found.  Lab Results  Component Value Date   NITRICOXIDE 12 09/16/2021        Assessment & Plan:   COPD with asthma ([place]) Acute flare in symptoms. FeNO 12 ppb today. Will obtain CXR. Prednisone burst and z pack.   Patient Instructions  Continue Symbicort 2 puffs Twice daily. Brush tongue and rinse mouth afterwards Continue Spiriva 1 puff daily  Continue Albuterol inhaler 2 puffs every 6 hours as needed for shortness of breath or wheezing. Notify if symptoms persist despite rescue inhaler/neb use. Restart flonase nasal spray 2 sprays each nostril daily Continue mucinex 1200 mg Twice daily for  chest congestion Continue singulair 10 mg At bedtime  Continue prilosec 20 mg Twice daily   -Prednisone 20 mg for 5 days. Take in AM with food. -Z pack (azithromycin). Take 2 tabs on day one followed by 1 tab for four additional days.  Chest x ray today. We will notify you of any abnormal results  Asthma Action Plan in place Rinse mouth after inhaled corticosteroid use.  Avoid triggers, when able.  Exercise encouraged. Notify if worsening symptoms upon exertion.  Notify and seek help if symptoms unrelieved by rescue inhaler.  Follow up in one month with Dr. Halford Chessman or Alanson Aly. If symptoms do not improve  or worsen, please contact office for sooner follow up or seek emergency care.    Allergic rhinitis Worsening symptoms; possible URI vs allergies. COVID neg. Restart flonase nasal spray. Continue singulair, OTC antihistamine, and Fasenra.    Clayton Bibles, NP 09/16/2021  Pt aware and understands NP's role.

## 2021-09-16 NOTE — Progress Notes (Signed)
Reviewed and agree with assessment/plan.   Coralyn Helling, MD [facility] 09/16/2021, 12:55 PM Pager:  [phone]

## 2021-09-16 NOTE — Assessment & Plan Note (Addendum)
Acute flare in symptoms. FeNO 12 ppb today. Will obtain CXR. Prednisone burst and z pack.   Patient Instructions  Continue Symbicort 2 puffs Twice daily. Brush tongue and rinse mouth afterwards Continue Spiriva 1 puff daily  Continue Albuterol inhaler 2 puffs every 6 hours as needed for shortness of breath or wheezing. Notify if symptoms persist despite rescue inhaler/neb use. Restart flonase nasal spray 2 sprays each nostril daily Continue mucinex 1200 mg Twice daily for chest congestion Continue singulair 10 mg At bedtime  Continue prilosec 20 mg Twice daily   -Prednisone 20 mg for 5 days. Take in AM with food. -Z pack (azithromycin). Take 2 tabs on day one followed by 1 tab for four additional days.  Chest x ray today. We will notify you of any abnormal results  Asthma Action Plan in place Rinse mouth after inhaled corticosteroid use.  Avoid triggers, when able.  Exercise encouraged. Notify if worsening symptoms upon exertion.  Notify and seek help if symptoms unrelieved by rescue inhaler.  Follow up in one month with Dr. Craige Cotta or Philis Nettle. If symptoms do not improve or worsen, please contact office for sooner follow up or seek emergency care.

## 2021-09-24 ENCOUNTER — Ambulatory Visit: Payer: Medicare PPO

## 2021-09-27 ENCOUNTER — Ambulatory Visit (INDEPENDENT_AMBULATORY_CARE_PROVIDER_SITE_OTHER): Payer: Medicare PPO | Admitting: *Deleted

## 2021-09-27 ENCOUNTER — Other Ambulatory Visit: Payer: Self-pay

## 2021-09-27 VITALS — BP 132/76 | HR 66 | Temp 98.1°F | Resp 18 | Ht 64.0 in | Wt 182.4 lb

## 2021-09-27 DIAGNOSIS — J4550 Severe persistent asthma, uncomplicated: Secondary | ICD-10-CM | POA: Diagnosis not present

## 2021-09-27 MED ORDER — ALBUTEROL SULFATE HFA 108 (90 BASE) MCG/ACT IN AERS: 2.0000 | INHALATION_SPRAY | Freq: Once | RESPIRATORY_TRACT | Status: DC | PRN

## 2021-09-27 MED ORDER — DIPHENHYDRAMINE HCL 50 MG/ML IJ SOLN: 50.0000 mg | Freq: Once | INTRAMUSCULAR | Status: DC | PRN

## 2021-09-27 MED ORDER — BENRALIZUMAB 30 MG/ML ~~LOC~~ SOSY
30.0000 mg | PREFILLED_SYRINGE | Freq: Once | SUBCUTANEOUS | Status: AC
  Administered 2021-09-27: 30 mg via SUBCUTANEOUS
  Filled 2021-09-27: qty 1

## 2021-09-27 MED ORDER — METHYLPREDNISOLONE SODIUM SUCC 125 MG IJ SOLR: 125.0000 mg | Freq: Once | INTRAMUSCULAR | Status: DC | PRN

## 2021-09-27 MED ORDER — EPINEPHRINE 0.3 MG/0.3ML IJ SOAJ: 0.3000 mg | Freq: Once | INTRAMUSCULAR | Status: DC | PRN

## 2021-09-27 MED ORDER — SODIUM CHLORIDE 0.9 % IV SOLN: Freq: Once | INTRAVENOUS | Status: DC | PRN

## 2021-09-27 MED ORDER — FAMOTIDINE IN NACL 20-0.9 MG/50ML-% IV SOLN: 20.0000 mg | Freq: Once | INTRAVENOUS | Status: DC | PRN

## 2021-09-27 NOTE — Progress Notes (Signed)
Diagnosis: Asthma  Provider:  Praveen Mannam, MD  Procedure: Injection  Fasenra (Benralizumab), Dose: 30 mg, Site: subcutaneous, Number of injections: 1  Discharge: Condition: Good, Destination: Home . AVS provided to patient.   Performed by:  Rachid Parham A, RN       

## 2021-09-29 ENCOUNTER — Ambulatory Visit: Payer: Medicare PPO

## 2021-10-14 ENCOUNTER — Ambulatory Visit: Payer: Medicare PPO | Admitting: Nurse Practitioner

## 2021-10-18 ENCOUNTER — Ambulatory Visit: Payer: Medicare PPO | Admitting: Nurse Practitioner

## 2021-10-18 ENCOUNTER — Other Ambulatory Visit: Payer: Self-pay

## 2021-10-18 ENCOUNTER — Encounter: Payer: Self-pay | Admitting: Nurse Practitioner

## 2021-10-18 VITALS — BP 120/78 | HR 74 | Temp 98.3°F | Ht 64.0 in | Wt 184.0 lb

## 2021-10-18 DIAGNOSIS — J449 Chronic obstructive pulmonary disease, unspecified: Secondary | ICD-10-CM

## 2021-10-18 DIAGNOSIS — J3089 Other allergic rhinitis: Secondary | ICD-10-CM

## 2021-10-18 DIAGNOSIS — R052 Subacute cough: Secondary | ICD-10-CM

## 2021-10-18 DIAGNOSIS — B370 Candidal stomatitis: Secondary | ICD-10-CM | POA: Diagnosis not present

## 2021-10-18 DIAGNOSIS — R058 Other specified cough: Secondary | ICD-10-CM

## 2021-10-18 LAB — CBC WITH DIFFERENTIAL/PLATELET
Basophils Absolute: 0 10*3/uL (ref 0.0–0.1)
Basophils Relative: 0.1 % (ref 0.0–3.0)
Eosinophils Absolute: 0 10*3/uL (ref 0.0–0.7)
Eosinophils Relative: 0 % (ref 0.0–5.0)
HCT: 33.6 % — ABNORMAL LOW (ref 36.0–46.0)
Hemoglobin: 10.9 g/dL — ABNORMAL LOW (ref 12.0–15.0)
Lymphocytes Relative: 30.2 % (ref 12.0–46.0)
Lymphs Abs: 3.6 10*3/uL (ref 0.7–4.0)
MCHC: 32.4 g/dL (ref 30.0–36.0)
MCV: 90 fl (ref 78.0–100.0)
Monocytes Absolute: 0.9 10*3/uL (ref 0.1–1.0)
Monocytes Relative: 7.9 % (ref 3.0–12.0)
Neutro Abs: 7.3 10*3/uL (ref 1.4–7.7)
Neutrophils Relative %: 61.8 % (ref 43.0–77.0)
Platelets: 363 10*3/uL (ref 150.0–400.0)
RBC: 3.74 Mil/uL — ABNORMAL LOW (ref 3.87–5.11)
RDW: 15 % (ref 11.5–15.5)
WBC: 11.8 10*3/uL — ABNORMAL HIGH (ref 4.0–10.5)

## 2021-10-18 LAB — NITRIC OXIDE: Nitric Oxide: 14

## 2021-10-18 MED ORDER — LEVOCETIRIZINE DIHYDROCHLORIDE 5 MG PO TABS: 5.0000 mg | ORAL_TABLET | Freq: Every evening | ORAL | 5 refills | Status: AC

## 2021-10-18 MED ORDER — NYSTATIN 100000 UNIT/ML MT SUSP: 5.0000 mL | Freq: Four times a day (QID) | OROMUCOSAL | 0 refills | Status: AC

## 2021-10-18 NOTE — Assessment & Plan Note (Signed)
Trigger prevention with step up to Xyzal. Continue postnasal drainage and cough control.  ?

## 2021-10-18 NOTE — Assessment & Plan Note (Addendum)
Continues to experience cough and occasional wheeze. FeNO nl today. No significant bronchospasm on exam; minimal end expiratory wheeze. Pt preferred to not repeat prednisone at this time. Will check CBC with diff to assess eos and IgE. If elevated, may need to consider changing biologic therapy. Continue current triple therapy regimen and Singulair. PFTs for further eval. May need further imaging if symptoms persist. ? ?Patient Instructions  ?Continue Symbicort 2 puffs Twice daily. Brush tongue and rinse mouth afterwards ?Continue Spiriva 1 puff daily  ?Continue Albuterol inhaler 2 puffs every 6 hours as needed for shortness of breath or wheezing. Notify if symptoms persist despite rescue inhaler/neb use. ?Restart flonase nasal spray 2 sprays each nostril daily ?Continue mucinex 1200 mg Twice daily for chest congestion ?Continue singulair 10 mg At bedtime  ?Continue prilosec 20 mg Twice daily  ?Continue Fasenra inj every 8 weeks  ? ?-Xyzal 5 mg daily for allergies ?-Nystatin oral rinses 5 mL four times a day for 10 days. Swish and swallow ? ?Labs today - CBC with diff and IgE ? ?Pulmonary function testing scheduled today ?  ?Follow up after PFTs with Dr. Craige Cotta or Philis Nettle. If symptoms do not improve or worsen, please contact office for sooner follow up or seek emergency care. ? ? ?

## 2021-10-18 NOTE — Assessment & Plan Note (Signed)
Evidence of thrush on exam. Oral hygiene discussed. Rx for nystatin rinses x 10 days. ?

## 2021-10-18 NOTE — Patient Instructions (Addendum)
Continue Symbicort 2 puffs Twice daily. Brush tongue and rinse mouth afterwards ?Continue Spiriva 1 puff daily  ?Continue Albuterol inhaler 2 puffs every 6 hours as needed for shortness of breath or wheezing. Notify if symptoms persist despite rescue inhaler/neb use. ?Restart flonase nasal spray 2 sprays each nostril daily ?Continue mucinex 1200 mg Twice daily for chest congestion ?Continue singulair 10 mg At bedtime  ?Continue prilosec 20 mg Twice daily  ?Continue Fasenra inj every 8 weeks  ? ?-Xyzal 5 mg daily for allergies ?-Nystatin oral rinses 5 mL four times a day for 10 days. Swish and swallow ? ?Labs today - CBC with diff and IgE ? ?Pulmonary function testing scheduled today ?  ?Follow up after PFTs with Dr. Craige Cotta or Philis Nettle. If symptoms do not improve or worsen, please contact office for sooner follow up or seek emergency care. ?

## 2021-10-18 NOTE — Progress Notes (Signed)
Reviewed and agree with assessment/plan. ? ? ?Allie Gerhold, MD ?[facility] ?10/18/2021, 3:32 PM ?Pager:  [phone] ? ?

## 2021-10-18 NOTE — Progress Notes (Signed)
? ?@Patient  ID: Mary [name], female    DOB: [birth year], 71 y.o.   MRN: [id number] ? ?Chief Complaint  ?Patient presents with  ? Follow-up  ?  She is doing better and still has some clear sputum at times that gets her strangles.  ?  ? ? ?Referring provider: ?Glendon Axe, MD ? ?HPI: ?71 year old female, former smoker (10 pack years) followed for COPD/asthma overlap with eosinophilic phenotype, allergic rhinitis, exercise-induced bronchoconstriction, upper airway cough syndrome.  She is a patient Dr. Juanetta Gosling and was last seen in office on 09/16/2021 by [facility] NP.  Past medical history significant for hypothyroidism, esophageal stricture, GERD, HLD, osteoporosis, colon polyp, nephrolithiasis, bilateral hydronephrosis. ? ?TEST/EVENTS:  ?02/28/2008 RAST: IgE 327.6, multiple allergens ?02/28/2008 PFTs: FEV1 1.44 (58), FEV1% 48, TLC 5.25 (99%), DLCO 76% ?02/05/2010 PFTs: FEV1 1.44 (60), FEV1% 51, TLC 5.22 (99), DLCO 78%, positive BD ?09/12/2011 spirometry: FEV1 1.28 (60%), FEV1% 55 ?04/18/2014 CT chest: Centrilobular emphysema ?12/04/2014 spirometry: FEV1 1.22 (62), FEV1% 52 ?02/03/2017 RAST: Dog, cat, dust mite, mold, tree bark; IgE 333 ?02/03/2017 spirometry: FEV1 1.2 (61), FEV1% 54 ?02/04/2019 IgE: 344 ?09/16/2021 CXR 2 view: both lungs clear. No active cardiopulmonary disease ? ?02/12/2021: OV with Dr. Halford Chessman. Improved after starting Spiriva. Recently getting more chest congestion with cough. Previously intolerant of Xolair in 2010. Daliresp ineffective. Started Fasenra in Sept 2019 and tolerated well. Continued on Spiriva, Symbicort, and singulair. PRN mucinex and flutter valve. CXR with chronic apical scarring and central vascular congestion w/o other significant findings.  ? ?09/16/2021. OV with Sindy Mccune NP for persistent cough, wheezing and increased SOB over the past 2-3 weeks. Cough was occasionally productive with white sputum. Worsened nasal symptoms as well. COVID neg. CXR clear. FeNO 12 ppb. Tx with prednisone burst and z pack.   ? ?10/18/2021: Today - follow up ?Patient presents today for follow up. She reports feeling somewhat better but still has a persistent cough and occasional wheeze at night. She did not notice a huge difference with the z pack and prednisone; maybe slight improvement. Cough is more so dry and hacky but occasionally has minimal sputum production - clear to white. She does still have some allergy type symptoms. She denies orthopnea, PND, lower extremity swelling, hemoptysis. No fevers or interim sick exposures. Previous CXR was clear. She continues on Symbicort, Spiriva, and Singulair. She is getting her fasenra injections as scheduled every 8 weeks.  ? ?FeNO 14 ppb ? ?Allergies  ?Allergen Reactions  ? Zoledronic Acid Other (See Comments)  ?  Other reaction(s): Tachycardia, patient provider will decide if she had a true allergy.   ? ? ?Immunization History  ?Administered Date(s) Administered  ? Fluad Quad(high Dose 65+) 03/15/2019, 04/30/2021  ? Influenza Split 04/02/2011, 04/01/2015  ? Influenza Whole 04/01/1997, 05/01/2012, 03/15/2014  ? Influenza, High Dose Seasonal PF 04/05/2017, 03/07/2018, 03/15/2019  ? Influenza,inj,Quad PF,6+ Mos 03/07/2016  ? Influenza-Unspecified 04/05/2017, 03/21/2020  ? Moderna Sars-Covid-2 Vaccination 09/02/2019, 09/30/2019  ? Pension scheme manager 25yrs & up 04/22/2021  ? Pneumococcal Conjugate-13 06/03/2015  ? Pneumococcal Polysaccharide-23 05/04/2007  ? Td 04/01/1997  ? ? ?Past Medical History:  ?Diagnosis Date  ? Allergic rhinitis   ? Chronic obstructive asthma   ? Colon polyp   ? Esophageal stricture   ? Exercise-induced bronchoconstriction   ? GERD (gastroesophageal reflux disease)   ? Hypothyroidism   ? Osteoporosis   ? Pneumonia   ? ? ?Tobacco History: ?Social History  ? ?Tobacco Use  ?Smoking Status Former  ? Packs/day:  1.00  ? Years: 10.00  ? Pack years: 10.00  ? Types: Cigarettes  ? Quit date: 08/02/1979  ? Years since quitting: 42.2  ?Smokeless Tobacco Never   ? ?Counseling given: Not Answered ? ? ?Outpatient Medications Prior to Visit  ?Medication Sig Dispense Refill  ? albuterol (PROAIR HFA) 108 (90 Base) MCG/ACT inhaler Inhale 2 puffs into the lungs every 6 (six) hours as needed. 18 g 3  ? Benralizumab (FASENRA) 30 MG/ML SOSY Inject 30 mg into the skin every 8 (eight) weeks.    ? benzonatate (TESSALON) 200 MG capsule Take 1 capsule (200 mg total) by mouth 3 (three) times daily as needed for cough. 30 capsule 1  ? escitalopram (LEXAPRO) 5 MG tablet Take 5 mg by mouth daily.    ? fluticasone (FLONASE) 50 MCG/ACT nasal spray Place 2 sprays into both nostrils daily. 18.2 mL 3  ? guaiFENesin (MUCINEX) 600 MG 12 hr tablet Take 1,200 mg by mouth daily.     ? Lactobacillus Rhamnosus, GG, (CULTURELLE PO) Take by mouth.    ? levothyroxine (SYNTHROID, LEVOTHROID) 75 MCG tablet Take 1 tablet by mouth daily.  3  ? Magnesium 100 MG CAPS Take by mouth.    ? montelukast (SINGULAIR) 10 MG tablet Take 1 tablet (10 mg total) by mouth at bedtime. 90 tablet 3  ? omeprazole (PRILOSEC) 20 MG capsule TAKE 1 CAPSULE BY MOUTH TWICE DAILY 30 MINUTES BEFORE FIRST AND LAST MEAL 60 capsule 0  ? pregabalin (LYRICA) 75 MG capsule Take 150 mg by mouth 2 (two) times daily.    ? simvastatin (ZOCOR) 10 MG tablet Take 10 mg by mouth daily.    ? theophylline (THEO-24) 100 MG 24 hr capsule TAKE ONE CAPSULE BY MOUTH DAILY 90 capsule 1  ? tiotropium (SPIRIVA HANDIHALER) 18 MCG inhalation capsule INHALE 1 CAPSULE VIA INHALER EVERY DAY 90 capsule 3  ? budesonide-formoterol (SYMBICORT) 160-4.5 MCG/ACT inhaler Inhale 2 puffs into the lungs 2 (two) times daily. 30.6 g 2  ? azithromycin (ZITHROMAX) 250 MG tablet Take 2 tabs on day one followed by 1 tab for four additional days 6 tablet 0  ? COVID-19 mRNA bivalent vaccine, Pfizer, injection Inject into the muscle. (Patient not taking: Reported on 10/18/2021) 0.3 mL 0  ? ?No facility-administered medications prior to visit.  ? ? ? ?Review of Systems:   ? ?Constitutional: No weight loss or gain, night sweats, fevers, chills, fatigue, or lassitude. ?HEENT: No headaches, difficulty swallowing, tooth/dental problems, or sore throat. No itching, ear ache. +allergy symptoms ?CV:  No chest pain, orthopnea, PND, swelling in lower extremities, anasarca, dizziness, palpitations, syncope ?Resp: +cough (occasionally productive clear to white sputum); occasional wheeze. No shortness of breath with exertion or at rest. No excess mucus or change in color of mucus.No hemoptysis. No chest wall deformity ?Skin: No rash, lesions, ulcerations ?MSK:  No joint pain or swelling.  No decreased range of motion.  No back pain. ?Neuro: No dizziness or lightheadedness.  ?Psych: No depression or anxiety. Mood stable.  ? ? ? ?Physical Exam: ? ?BP 120/78 (BP Location: Left Arm, Patient Position: Sitting, Cuff Size: Normal)   Pulse 74   Temp 98.3 ?F (36.8 ?C) (Oral)   Ht 5\' 4"  (1.626 m)   Wt 184 lb (83.5 kg)   SpO2 96%   BMI 31.58 kg/m?  ? ?GEN: Pleasant, interactive, well-appearing; obese; in no acute distress. ?HEENT:  Normocephalic and atraumatic. PERRLA. Sclera white. Nasal turbinates pink, moist and patent bilaterally. Clear rhinorrhea present.  Oropharynx erythematous and moist, without exudate or edema. No lesions, ulcerations ?NECK:  Supple w/ fair ROM. No JVD present. Normal carotid impulses w/o bruits. Thyroid symmetrical with no goiter or nodules palpated. No lymphadenopathy.   ?CV: RRR, no m/r/g, no peripheral edema. Pulses intact, +2 bilaterally. No cyanosis, pallor or clubbing. ?PULMONARY:  Unlabored, regular breathing. Minimal end expiratory wheeze bilaterally posteriorly. No accessory muscle use. No dullness to percussion. ?GI: BS present and normoactive. Soft, non-tender to palpation. No organomegaly or masses detected. No CVA tenderness. ?MSK: No erythema, warmth or tenderness. Cap refil <2 sec all extrem. No deformities or joint swelling noted.  ?Neuro: A/Ox3. No focal  deficits noted.   ?Skin: Warm, no lesions or rashe ?Psych: Normal affect and behavior. Judgement and thought content appropriate.  ? ? ? ?Lab Results: ? ?CBC ?   ?Component Value Date/Time  ? WBC 9.7 02/04/2019 1626  ? RBC 4

## 2021-10-18 NOTE — Assessment & Plan Note (Signed)
Stop OTC allergy medicine. Trial step up to Xyzal. Will await labs to determine next steps for biologic therapy. ?

## 2021-10-19 LAB — IGE: IgE (Immunoglobulin E), Serum: 320 kU/L — ABNORMAL HIGH (ref <=114)

## 2021-10-20 NOTE — Progress Notes (Signed)
IgE elevated but stable over the past couple of years. Her eosinophils are 0, which is good. If she keeps having breakthrough symptoms, we may need to consider changing her Harrington Challenger but I would hold off for right now. She does have a slightly elevated WBC slightly elevated; could be r/t recent prednisone use but I would recommend rechecking in 3-4 weeks. Previous CXR was clear.  ? ?She does have an anemia, which is new. Would not contribute to her cough or wheezing but could worsen her SOB. Please advise her to follow up with her PCP for further workup and treatment regarding this. Thanks!

## 2021-11-06 ENCOUNTER — Other Ambulatory Visit: Payer: Self-pay | Admitting: Pulmonary Disease

## 2021-11-16 DIAGNOSIS — D649 Anemia, unspecified: Secondary | ICD-10-CM | POA: Insufficient documentation

## 2021-11-16 DIAGNOSIS — F8081 Childhood onset fluency disorder: Secondary | ICD-10-CM | POA: Insufficient documentation

## 2021-11-17 ENCOUNTER — Telehealth: Payer: Self-pay | Admitting: Nurse Practitioner

## 2021-11-17 DIAGNOSIS — J3089 Other allergic rhinitis: Secondary | ICD-10-CM

## 2021-11-17 DIAGNOSIS — J441 Chronic obstructive pulmonary disease with (acute) exacerbation: Secondary | ICD-10-CM

## 2021-11-17 NOTE — Telephone Encounter (Signed)
Okay to refill.  1 capsule 3 times daily as needed for cough

## 2021-11-17 NOTE — Telephone Encounter (Signed)
Called and spoke with patient who is calling to request refill on benzonatate (TESSALON) 200 MG. [name]  [id number] [place] ? ?Katie please advise ? ?

## 2021-11-18 MED ORDER — BENZONATATE 200 MG PO CAPS: 200.0000 mg | ORAL_CAPSULE | Freq: Three times a day (TID) | ORAL | 1 refills | Status: DC | PRN

## 2021-11-18 NOTE — Telephone Encounter (Signed)
Spoke with pt to notify her that Tessalon refill was sent in . Pt stated understanding. Nothing further needed at this time.  ?

## 2021-11-22 ENCOUNTER — Ambulatory Visit (INDEPENDENT_AMBULATORY_CARE_PROVIDER_SITE_OTHER): Payer: Medicare PPO | Admitting: *Deleted

## 2021-11-22 VITALS — BP 122/77 | HR 72 | Temp 98.6°F | Resp 18 | Ht 64.0 in | Wt 183.0 lb

## 2021-11-22 DIAGNOSIS — J4550 Severe persistent asthma, uncomplicated: Secondary | ICD-10-CM

## 2021-11-22 MED ORDER — BENRALIZUMAB 30 MG/ML ~~LOC~~ SOSY
30.0000 mg | PREFILLED_SYRINGE | Freq: Once | SUBCUTANEOUS | Status: AC
  Administered 2021-11-22: 30 mg via SUBCUTANEOUS
  Filled 2021-11-22: qty 1

## 2021-11-22 NOTE — Progress Notes (Signed)
Diagnosis: Asthma  Provider:  Praveen Mannam, MD  Procedure: Injection  Fasenra (Benralizumab), Dose: 30 mg, Site: subcutaneous, Number of injections: 1  Discharge: Condition: Good, Destination: Home . AVS provided to patient.   Performed by:  Andric Kerce A, RN       

## 2021-11-25 ENCOUNTER — Other Ambulatory Visit: Payer: Self-pay | Admitting: Pulmonary Disease

## 2021-11-26 ENCOUNTER — Telehealth: Payer: Self-pay | Admitting: Pulmonary Disease

## 2021-11-26 MED ORDER — BUDESONIDE-FORMOTEROL FUMARATE 160-4.5 MCG/ACT IN AERO: 2.0000 | INHALATION_SPRAY | Freq: Two times a day (BID) | RESPIRATORY_TRACT | 2 refills | Status: DC

## 2021-11-26 NOTE — Telephone Encounter (Signed)
Called and spoke with patient. She was calling to request a 90 day supply to be sent to [facility]. I advised her that it appears that the RX was sent to [facility] this morning around 8am but I would call to see if I could cancel the RX.  ? ?Called [facility] and spoke with [place]. She stated that she could not cancel the RX since it was already marked for shipping but she could cancel the other 2 refills.  ? ?Called and spoke with patient to let her know that the next 3 refills are on the way from [facility] but I would still send a 90 day for [name] to have on hand. She verbalized understanding.  ? ?Nothing further needed at time of call.  ?

## 2021-12-08 ENCOUNTER — Telehealth: Payer: Self-pay | Admitting: *Deleted

## 2021-12-08 ENCOUNTER — Ambulatory Visit: Payer: Medicare PPO | Admitting: Internal Medicine

## 2021-12-08 DIAGNOSIS — R052 Subacute cough: Secondary | ICD-10-CM

## 2021-12-08 DIAGNOSIS — J3089 Other allergic rhinitis: Secondary | ICD-10-CM

## 2021-12-08 MED ORDER — AZITHROMYCIN 250 MG PO TABS: ORAL_TABLET | ORAL | 0 refills | Status: DC

## 2021-12-08 MED ORDER — PREDNISONE 10 MG PO TABS: ORAL_TABLET | ORAL | 0 refills | Status: AC

## 2021-12-08 NOTE — Progress Notes (Signed)
Pt unable to perform due to excessive coughing due to sickness. The pt would like to see a provider, advised pt to stop at the front desk. Pt will need to reschedule PFT.  ?

## 2021-12-08 NOTE — Telephone Encounter (Signed)
Can send script for prednisone 10 mg pill >> 3 pills daily for 2 days, 2 pills daily for 2 days, 1 pill daily for 2 days. ? ?Please send script for Zpak. ? ?Please schedule ROV with me later this week.  Okay to double book visit. ?

## 2021-12-08 NOTE — Telephone Encounter (Signed)
Called and spoke with patient. She verbalized understanding. I attempted to get her scheduled for this week but she stated that she is leaving to go out of town tomorrow. I was able to get her scheduled for 12/17/21 at 230pm.  ? ?RXs have been sent in for her.  ? ?Nothing further needed at time of call.  ?

## 2021-12-08 NOTE — Telephone Encounter (Signed)
Called and spoke with patient. She had a PFT this morning and was under the impression that she would be seeing Dr. Craige Cotta afterwards. She was not able to complete the PFT today due to her coughing. She has been coughing more over the past 3 days. The cough has been productive at times with small amounts of clear phlegm. Increased wheezing especially with exertion. She tested herself for covid several times and all of the tests have been negative. Denied any fevers. No sick exposures.  ? ?She ran out of her Tessalon perles a few weeks ago and has not been able to get a refill. She is still using her Symbicort, Spiriva and albuterol.  ? ?[name].  ? ?Dr. Craige Cotta, can you please advise? Thanks!  ?

## 2021-12-08 NOTE — Patient Instructions (Signed)
Pt unable to perform due to excessive coughing due to sickness. The pt would like to see a provider, advised pt to stop at the front desk. Pt will need to reschedule PFT.  ?

## 2021-12-17 ENCOUNTER — Encounter: Payer: Self-pay | Admitting: Pulmonary Disease

## 2021-12-17 ENCOUNTER — Ambulatory Visit (INDEPENDENT_AMBULATORY_CARE_PROVIDER_SITE_OTHER): Payer: Medicare PPO | Admitting: Pulmonary Disease

## 2021-12-17 VITALS — BP 122/82 | HR 87 | Temp 97.8°F | Ht 64.0 in | Wt 187.4 lb

## 2021-12-17 DIAGNOSIS — J449 Chronic obstructive pulmonary disease, unspecified: Secondary | ICD-10-CM | POA: Diagnosis not present

## 2021-12-17 DIAGNOSIS — J441 Chronic obstructive pulmonary disease with (acute) exacerbation: Secondary | ICD-10-CM

## 2021-12-17 DIAGNOSIS — J8283 Eosinophilic asthma: Secondary | ICD-10-CM | POA: Diagnosis not present

## 2021-12-17 DIAGNOSIS — J45901 Unspecified asthma with (acute) exacerbation: Secondary | ICD-10-CM

## 2021-12-17 MED ORDER — AZITHROMYCIN 500 MG PO TABS: 500.0000 mg | ORAL_TABLET | Freq: Every day | ORAL | 0 refills | Status: DC

## 2021-12-17 MED ORDER — PREDNISONE 10 MG PO TABS: ORAL_TABLET | ORAL | 0 refills | Status: AC

## 2021-12-17 NOTE — Progress Notes (Signed)
[facility]  Chief Complaint  Patient presents with   Follow-up    Coughing and congestion for about 1 month    Constitutional:  BP 122/82 (BP Location: Left Arm, Patient Position: Sitting, Cuff Size: Normal)   Pulse 87   Temp 97.8 F (36.6 C) (Oral)   Ht 5\' 4"  (1.626 m)   Wt 187 lb 6.4 oz (85 kg)   SpO2 100%   BMI 32.17 kg/m   Past Medical History:  PNA, Osteoporosis, Hypothyroidism, GERD, Esophageal stricture, Colon polyp, Nephrolithiasis, b/l hydronephrosis  Past Surgical History:  She  has a past surgical history that includes Esophageal dilation and Tubal ligation.  Brief Summary:  Mary [name] is a 71 y.o. female with former smoker with COPD/asthma with eosinophilic phenotype, allergic rhinitis, and exercise induced bronchoconstriction.      Subjective:   She was seen by 63 several times since I last saw her.  Chest xray from 09/26/21 showed kyphosis.  CBC showed 0 eosinophils.  She has persistent cough and chest congestion.  Having more wheeze.  Albuterol nebulizer helps, but makes her jittery.  Not having fever, sinus congestion, trouble swallowing, skin rash, or leg swelling.  Has trouble with cough and wheeze when laying flat to sleep.  Physical Exam:   Appearance - well kempt   ENMT - no sinus tenderness, no oral exudate, no LAN, Mallampati 3 airway, no stridor  Respiratory - b/l expiratory wheezing  CV - s1s2 regular rate and rhythm, no murmurs  Ext - no clubbing, no edema  Skin - no rashes  Psych - normal mood and affect    Pulmonary testing:  RAST 02/28/08 >> IgE 327.6, multiple allergens PFT 02/28/08 >> FEV1 1.44(58%), FEV1% 48, TLC 5.25(99%), DLCO 76%   PFT 02/05/10 >> FEV1 1.44(60%), FEV1% 51, TLC 5.22(99%), DLCO 78%, +BD   Started xolair 08/14/08 >> stopped 09/10 (pt intolerant of medication) Spirometry 09/12/11 >> FEV1 1.28(60%), FEV1% 55 Spirometry 12/04/14 >> FEV1 1.22 (62%),  FEV1% 52 RAST 02/03/17 >> dog, cat, dust mite, mold, tree bark, IgE 333 Spirometry 02/03/17 >> FEV1 1.2 (61%), FEV1% 54 IgE 02/04/19 >> 344 IgE 10/18/21 >> 320  Chest Imaging:  CT chest 04/18/14 >> centrilobular emphysema  Social History:  She  reports that she quit smoking about 42 years ago. Her smoking use included cigarettes. She has a 10.00 pack-year smoking history. She has never used smokeless tobacco. She reports current alcohol use of about 1.0 standard drink per week. She reports that she does not use drugs.  Family History:  Her family history is not on file.     Assessment/Plan:   COPD with asthma and eosinophilic phenotype, and chronic bronchitis. - intolerant of xolair in 2010 - daliresp ineffective - she has been on fasenra since September 2019; she has persisted symptoms for past few months, so might need to consider alternative biologic agent if her symptoms don't get under better control - will give longer course of prednisone and zithromax - continue spiriva, symbicort, singulair, theo-24 - prn mucinex, flutter valve   Allergic rhinitis. - continue fasenra, singulair, xyzal, flonase   Time Spent Involved in Patient Care on Day of Examination:  35 minutes  Follow up:   Patient Instructions  Prednisone 10 mg pill >> 4 pills daily for 3 days, 3 pills daily for 3 days, 2 pills daily for 3 days, 1 pill daily for 3 days  Zithromax 500 mg pill daily for 7 days  Follow up in 4 weeks  Medication List:   Allergies as of 12/17/2021       Reactions   Zoledronic Acid Other (See Comments)   Other reaction(s): Tachycardia, patient provider will decide if she had a true allergy.         Medication List        Accurate as of Dec 17, 2021  3:04 PM. If you have any questions, ask your nurse or doctor.          albuterol 108 (90 Base) MCG/ACT inhaler Commonly known as: ProAir HFA Inhale 2 puffs into the lungs every 6 (six) hours as needed.   azithromycin  500 MG tablet Commonly known as: Zithromax Take 1 tablet (500 mg total) by mouth daily. What changed:  medication strength how much to take how to take this when to take this additional instructions Changed by: Coralyn Helling, MD   benzonatate 200 MG capsule Commonly known as: TESSALON Take 1 capsule (200 mg total) by mouth 3 (three) times daily as needed for cough.   budesonide-formoterol 160-4.5 MCG/ACT inhaler Commonly known as: Symbicort Inhale 2 puffs into the lungs 2 (two) times daily.   CULTURELLE PO Take by mouth.   escitalopram 5 MG tablet Commonly known as: LEXAPRO Take 5 mg by mouth daily.   Fasenra 30 MG/ML Sosy Generic drug: Benralizumab Inject 30 mg into the skin every 8 (eight) weeks.   fluticasone 50 MCG/ACT nasal spray Commonly known as: FLONASE Place 2 sprays into both nostrils daily.   guaiFENesin 600 MG 12 hr tablet Commonly known as: MUCINEX Take 1,200 mg by mouth daily.   levocetirizine 5 MG tablet Commonly known as: XYZAL Take 1 tablet (5 mg total) by mouth every evening.   levothyroxine 75 MCG tablet Commonly known as: SYNTHROID Take 1 tablet by mouth daily.   Magnesium 100 MG Caps Take by mouth.   montelukast 10 MG tablet Commonly known as: SINGULAIR Take 1 tablet (10 mg total) by mouth at bedtime.   omeprazole 20 MG capsule Commonly known as: PRILOSEC TAKE 1 CAPSULE BY MOUTH TWICE DAILY 30 MINUTES BEFORE FIRST AND LAST MEAL   predniSONE 10 MG tablet Commonly known as: DELTASONE Take 4 tablets (40 mg total) by mouth daily with breakfast for 3 days, THEN 3 tablets (30 mg total) daily with breakfast for 3 days, THEN 2 tablets (20 mg total) daily with breakfast for 3 days, THEN 1 tablet (10 mg total) daily with breakfast for 3 days. Start taking on: Dec 17, 2021 Started by: Coralyn Helling, MD   pregabalin 75 MG capsule Commonly known as: LYRICA Take 150 mg by mouth 2 (two) times daily.   simvastatin 10 MG tablet Commonly known as:  ZOCOR Take 10 mg by mouth daily.   Spiriva HandiHaler 18 MCG inhalation capsule Generic drug: tiotropium INHALE 1 CAPSULE VIA INHALER EVERY DAY   Theo-24 100 MG 24 hr capsule Generic drug: theophylline TAKE 1 CAPSULE EVERY DAY        Signature:  Coralyn Helling, MD [facility] Pager - [phone] 12/17/2021, 3:04 PM

## 2021-12-17 NOTE — Patient Instructions (Signed)
Prednisone 10 mg pill >> 4 pills daily for 3 days, 3 pills daily for 3 days, 2 pills daily for 3 days, 1 pill daily for 3 days  Zithromax 500 mg pill daily for 7 days  Follow up in 4 weeks

## 2022-01-17 ENCOUNTER — Ambulatory Visit: Payer: Medicare PPO

## 2022-01-18 ENCOUNTER — Ambulatory Visit (INDEPENDENT_AMBULATORY_CARE_PROVIDER_SITE_OTHER): Payer: Medicare PPO

## 2022-01-18 VITALS — BP 121/78 | HR 71 | Temp 98.1°F | Resp 18 | Ht 64.0 in | Wt 191.8 lb

## 2022-01-18 DIAGNOSIS — J4550 Severe persistent asthma, uncomplicated: Secondary | ICD-10-CM

## 2022-01-18 MED ORDER — BENRALIZUMAB 30 MG/ML ~~LOC~~ SOSY
30.0000 mg | PREFILLED_SYRINGE | Freq: Once | SUBCUTANEOUS | Status: AC
  Administered 2022-01-18: 30 mg via SUBCUTANEOUS
  Filled 2022-01-18: qty 1

## 2022-01-18 NOTE — Progress Notes (Signed)
Diagnosis: Asthma  Provider:  Chilton Greathouse, MD  Procedure: Injection  Fasenra (Benralizumab), Dose: 30 mg, Site: subcutaneous, Number of injections: 1  Discharge: Condition: Good, Destination: Home . AVS denied.  Performed by:  Garnette Czech, RN

## 2022-01-28 DIAGNOSIS — Z9889 Other specified postprocedural states: Secondary | ICD-10-CM | POA: Insufficient documentation

## 2022-01-31 ENCOUNTER — Ambulatory Visit: Payer: Medicare PPO | Admitting: Pulmonary Disease

## 2022-01-31 ENCOUNTER — Encounter: Payer: Self-pay | Admitting: Pulmonary Disease

## 2022-01-31 VITALS — BP 126/84 | HR 82 | Temp 98.2°F | Ht 64.0 in | Wt 192.0 lb

## 2022-01-31 DIAGNOSIS — J8283 Eosinophilic asthma: Secondary | ICD-10-CM

## 2022-01-31 DIAGNOSIS — J449 Chronic obstructive pulmonary disease, unspecified: Secondary | ICD-10-CM | POA: Diagnosis not present

## 2022-01-31 NOTE — Progress Notes (Signed)
[facility]  Chief Complaint  Patient presents with   Follow-up    She is doing well and has no concerns with her breathing.,     Constitutional:  BP 126/84 (BP Location: Right Arm, Cuff Size: Normal)   Pulse 82   Temp 98.2 F (36.8 C) (Oral)   Ht 5\' 4"  (1.626 m)   Wt 192 lb (87.1 kg)   SpO2 98%   BMI 32.96 kg/m   Past Medical History:  PNA, Osteoporosis, Hypothyroidism, GERD, Esophageal stricture, Colon polyp, Nephrolithiasis, b/l hydronephrosis  Past Surgical History:  She  has a past surgical history that includes Esophageal dilation and Tubal ligation.  Brief Summary:  Mary [name] is a 71 y.o. female with former smoker with COPD/asthma with eosinophilic phenotype, allergic rhinitis, and exercise induced bronchoconstriction.      Subjective:   Breathing is better since last visit.  Not having cough, wheeze, sputum, or chest congestion.  Sinuses okay.  Sleeping well.  Had intervention with her back last week.  Slowly improving.  Physical Exam:   Appearance - well kempt   ENMT - no sinus tenderness, no oral exudate, no LAN, Mallampati 3 airway, no stridor  Respiratory - equal breath sounds bilaterally, no wheezing or rales  CV - s1s2 regular rate and rhythm, no murmurs  Ext - no clubbing, no edema  Skin - no rashes  Psych - normal mood and affect     Pulmonary testing:  RAST 02/28/08 >> IgE 327.6, multiple allergens PFT 02/28/08 >> FEV1 1.44(58%), FEV1% 48, TLC 5.25(99%), DLCO 76%   PFT 02/05/10 >> FEV1 1.44(60%), FEV1% 51, TLC 5.22(99%), DLCO 78%, +BD   Started xolair 08/14/08 >> stopped 09/10 (pt intolerant of medication) Spirometry 09/12/11 >> FEV1 1.28(60%), FEV1% 55 Spirometry 12/04/14 >> FEV1 1.22 (62%), FEV1% 52 RAST 02/03/17 >> dog, cat, dust mite, mold, tree bark, IgE 333 Spirometry 02/03/17 >> FEV1 1.2 (61%), FEV1% 54 IgE 02/04/19 >> 344 IgE 10/18/21 >> 320  Chest Imaging:  CT chest 04/18/14  >> centrilobular emphysema  Social History:  She  reports that she quit smoking about 42 years ago. Her smoking use included cigarettes. She has a 10.00 pack-year smoking history. She has never used smokeless tobacco. She reports current alcohol use of about 1.0 standard drink of alcohol per week. She reports that she does not use drugs.  Family History:  Her family history is not on file.     Assessment/Plan:   COPD with asthma and eosinophilic phenotype, and chronic bronchitis. - intolerant of xolair in 2010 - daliresp ineffective - started fasenra in September 2019; this is working well in controlling exacerbations and she will continue this - continue spiriva handihaler daily, symbicort 160 two puffs bid, singulair 10 mg nightly, and theo-24 100 mg daily - prn albuterol, mucinex, flutter valve  Allergic rhinitis. - continue fasenra, singulair, xyzal, flonase   Time Spent Involved in Patient Care on Day of Examination:  26 minutes  Follow up:   Patient Instructions  Follow up in 6 months  Medication List:   Allergies as of 01/31/2022       Reactions   Zoledronic Acid Other (See Comments)   Other reaction(s): Tachycardia, patient provider will decide if she had a true allergy.         Medication List        Accurate as of January 31, 2022 11:34 AM. If you have any questions, ask your nurse or doctor.  STOP taking these medications    azithromycin 500 MG tablet Commonly known as: Zithromax Stopped by: Coralyn Helling, MD       TAKE these medications    albuterol 108 (90 Base) MCG/ACT inhaler Commonly known as: ProAir HFA Inhale 2 puffs into the lungs every 6 (six) hours as needed.   benzonatate 200 MG capsule Commonly known as: TESSALON Take 1 capsule (200 mg total) by mouth 3 (three) times daily as needed for cough.   budesonide-formoterol 160-4.5 MCG/ACT inhaler Commonly known as: Symbicort Inhale 2 puffs into the lungs 2 (two) times daily.    CULTURELLE PO Take by mouth.   escitalopram 5 MG tablet Commonly known as: LEXAPRO Take 5 mg by mouth daily.   Fasenra 30 MG/ML Sosy Generic drug: Benralizumab Inject 30 mg into the skin every 8 (eight) weeks.   fluticasone 50 MCG/ACT nasal spray Commonly known as: FLONASE Place 2 sprays into both nostrils daily.   guaiFENesin 600 MG 12 hr tablet Commonly known as: MUCINEX Take 1,200 mg by mouth daily.   levocetirizine 5 MG tablet Commonly known as: XYZAL Take 1 tablet (5 mg total) by mouth every evening.   levothyroxine 75 MCG tablet Commonly known as: SYNTHROID Take 1 tablet by mouth daily.   Magnesium 100 MG Caps Take by mouth.   montelukast 10 MG tablet Commonly known as: SINGULAIR Take 1 tablet (10 mg total) by mouth at bedtime.   omeprazole 20 MG capsule Commonly known as: PRILOSEC TAKE 1 CAPSULE BY MOUTH TWICE DAILY 30 MINUTES BEFORE FIRST AND LAST MEAL   pregabalin 75 MG capsule Commonly known as: LYRICA Take 150 mg by mouth 2 (two) times daily.   simvastatin 10 MG tablet Commonly known as: ZOCOR Take 10 mg by mouth daily.   Spiriva HandiHaler 18 MCG inhalation capsule Generic drug: tiotropium INHALE 1 CAPSULE VIA INHALER EVERY DAY   Theo-24 100 MG 24 hr capsule Generic drug: theophylline TAKE 1 CAPSULE EVERY DAY        Signature:  Coralyn Helling, MD [employer] [facility] Pager - [phone] 01/31/2022, 11:34 AM

## 2022-01-31 NOTE — Patient Instructions (Signed)
Follow up in 6 months 

## 2022-02-14 ENCOUNTER — Other Ambulatory Visit: Payer: Self-pay | Admitting: Obstetrics and Gynecology

## 2022-02-14 DIAGNOSIS — Z1231 Encounter for screening mammogram for malignant neoplasm of breast: Secondary | ICD-10-CM

## 2022-02-24 DIAGNOSIS — Z9889 Other specified postprocedural states: Secondary | ICD-10-CM | POA: Insufficient documentation

## 2022-03-11 ENCOUNTER — Telehealth: Payer: Self-pay | Admitting: Pulmonary Disease

## 2022-03-11 MED ORDER — THEOPHYLLINE ER 100 MG PO TB12
50.0000 mg | ORAL_TABLET | Freq: Two times a day (BID) | ORAL | 5 refills | Status: DC
Start: 2022-03-11 — End: 2022-03-21

## 2022-03-11 MED ORDER — THEOPHYLLINE ER 100 MG PO TB12: 50.0000 mg | ORAL_TABLET | Freq: Two times a day (BID) | ORAL | 0 refills | Status: DC

## 2022-03-11 NOTE — Telephone Encounter (Signed)
I have sent script for theodur 100 mg pill.  She is to take 1/2 pill twice per day.  I have given her one month supply.  Hopefully she will be able to resume theo-24 after this.

## 2022-03-11 NOTE — Telephone Encounter (Signed)
Spoke with the pt  She is aware [name] unable to get the theo-dur  She wants to get this from [facility]  I have sent this to her pharm  She will call if any issues obtaining med  Nothing further needed

## 2022-03-11 NOTE — Telephone Encounter (Signed)
Notified pt that medication order had been placed by Dr. Craige Cotta. Pt stated understanding. Nothing further needed at this time.

## 2022-03-11 NOTE — Telephone Encounter (Signed)
Called and spoke with pt who states her Theo-24 medication is on backorder at all pharmacies that she knows of and she states that they do not know when they will be getting it in stock. Pt wants to know if an alternative medication could be prescribed. Dr. Craige Cotta, please advise on this for pt.

## 2022-03-11 NOTE — Telephone Encounter (Signed)
Attempted to call pt but unable to reach. Left message for her to return call. 

## 2022-03-14 ENCOUNTER — Ambulatory Visit (INDEPENDENT_AMBULATORY_CARE_PROVIDER_SITE_OTHER): Payer: Medicare PPO

## 2022-03-14 VITALS — BP 134/80 | HR 83 | Temp 99.0°F | Resp 18 | Ht 64.0 in | Wt 188.2 lb

## 2022-03-14 DIAGNOSIS — J4550 Severe persistent asthma, uncomplicated: Secondary | ICD-10-CM

## 2022-03-14 MED ORDER — BENRALIZUMAB 30 MG/ML ~~LOC~~ SOSY
30.0000 mg | PREFILLED_SYRINGE | Freq: Once | SUBCUTANEOUS | Status: AC
  Administered 2022-03-14: 30 mg via SUBCUTANEOUS

## 2022-03-14 NOTE — Progress Notes (Signed)
Diagnosis: Asthma  Provider:  Chilton Greathouse MD  Procedure: Injection  Fasenra (Benralizumab), Dose: 30 mg, Site: subcutaneous, Number of injections: 1  Discharge: Condition: Good, Destination: Home . AVS provided to patient.   Performed by:  Garnette Czech, RN

## 2022-03-21 ENCOUNTER — Telehealth: Payer: Self-pay | Admitting: Pulmonary Disease

## 2022-03-21 ENCOUNTER — Encounter: Payer: Self-pay | Admitting: Pulmonary Disease

## 2022-03-21 ENCOUNTER — Other Ambulatory Visit (HOSPITAL_COMMUNITY): Payer: Self-pay

## 2022-03-21 MED ORDER — THEOPHYLLINE ER 100 MG PO TB12
50.0000 mg | ORAL_TABLET | Freq: Two times a day (BID) | ORAL | 5 refills | Status: DC
  Filled 2022-03-21: qty 30, 30d supply, fill #0

## 2022-03-21 NOTE — Telephone Encounter (Signed)
Called and spoke to patient and she requested we try to send the medication into [facility]. Will try to send in and see if they have the medication. I advised patient if they did not have it then we can reach out to Dr Craige Cotta for an alternative. Nothing further needed

## 2022-03-22 ENCOUNTER — Other Ambulatory Visit: Payer: Self-pay

## 2022-03-22 ENCOUNTER — Ambulatory Visit
Admission: RE | Admit: 2022-03-22 | Discharge: 2022-03-22 | Disposition: A | Discharge status: Home / Self Care | Payer: Medicare PPO | Source: Ambulatory Visit | Attending: Obstetrics and Gynecology | Admitting: Obstetrics and Gynecology

## 2022-03-22 ENCOUNTER — Other Ambulatory Visit (HOSPITAL_COMMUNITY): Payer: Self-pay

## 2022-03-22 ENCOUNTER — Telehealth: Payer: Self-pay | Admitting: Pulmonary Disease

## 2022-03-22 DIAGNOSIS — Z1231 Encounter for screening mammogram for malignant neoplasm of breast: Secondary | ICD-10-CM

## 2022-03-22 NOTE — Telephone Encounter (Signed)
Called and spoke with pt who stated her insurance will not cover Theodur 100mg . Also stated that she thought April was going to set her up with a mychart visit with VS 8/23. Stated to pt that that visit was scheduled and he would see what might be able to be done about her meds during that visit. Pt verbalized understanding. Nothing further needed.

## 2022-03-23 ENCOUNTER — Encounter: Payer: Self-pay | Admitting: Pulmonary Disease

## 2022-03-23 ENCOUNTER — Telehealth (INDEPENDENT_AMBULATORY_CARE_PROVIDER_SITE_OTHER): Payer: Medicare PPO | Admitting: Pulmonary Disease

## 2022-03-23 ENCOUNTER — Telehealth: Payer: Self-pay | Admitting: Pulmonary Disease

## 2022-03-23 VITALS — Ht 64.0 in | Wt 192.0 lb

## 2022-03-23 DIAGNOSIS — J8283 Eosinophilic asthma: Secondary | ICD-10-CM | POA: Diagnosis not present

## 2022-03-23 DIAGNOSIS — J3089 Other allergic rhinitis: Secondary | ICD-10-CM

## 2022-03-23 DIAGNOSIS — J449 Chronic obstructive pulmonary disease, unspecified: Secondary | ICD-10-CM | POA: Diagnosis not present

## 2022-03-23 NOTE — Telephone Encounter (Signed)
Patient called and is asking for the Rx of the medication that her and Dr Craige Cotta talked about at office visit today 8/23 can be sent to Mary [name] at [facility]?  Please advise sir

## 2022-03-23 NOTE — Patient Instructions (Signed)
Follow up in 6 months 

## 2022-03-23 NOTE — Progress Notes (Signed)
[facility]  Chief Complaint  Patient presents with   Follow-up    Follow-up: Medication management     Constitutional:  Ht 5\' 4"  (1.626 m)   Wt 192 lb (87.1 kg)   BMI 32.96 kg/m   Past Medical History:  PNA, Osteoporosis, Hypothyroidism, GERD, Esophageal stricture, Colon polyp, Nephrolithiasis, b/l hydronephrosis  Past Surgical History:  She  has a past surgical history that includes Esophageal dilation and Tubal ligation.  Brief Summary:  Mary [name] is a 71 y.o. female with former smoker with COPD/asthma with eosinophilic phenotype, allergic rhinitis, and exercise induced bronchoconstriction.      Subjective:  Virtual Visit via Video Note  I connected with 63 on 03/23/22 at 10:00 AM EDT by a video enabled telemedicine application and verified that I am speaking with the correct person using two identifiers.  Location: Patient: home Provider: medical office   I discussed the limitations of evaluation and management by telemedicine and the availability of in person appointments. The patient expressed understanding and agreed to proceed.  History of Present Illness:  She has not been able to get refill for theo-24 100 mg.  Only available scripts are much higher doses.  She has checked with multiple pharmacies.  This likely won't be available until October at the earliest.  She took her last dose of theo-24 2 days ago.  So far her breathing has been stable.  She isn't having cough, wheeze, or sputum.    Physical Exam:  Alert, pleasant, speaking in full sentences, no distress.     Pulmonary testing:  RAST 02/28/08 >> IgE 327.6, multiple allergens PFT 02/28/08 >> FEV1 1.44(58%), FEV1% 48, TLC 5.25(99%), DLCO 76%   PFT 02/05/10 >> FEV1 1.44(60%), FEV1% 51, TLC 5.22(99%), DLCO 78%, +BD   Started xolair 08/14/08 >> stopped 09/10 (pt intolerant of medication) Spirometry 09/12/11 >> FEV1 1.28(60%),  FEV1% 55 Spirometry 12/04/14 >> FEV1 1.22 (62%), FEV1% 52 RAST 02/03/17 >> dog, cat, dust mite, mold, tree bark, IgE 333 Spirometry 02/03/17 >> FEV1 1.2 (61%), FEV1% 54 IgE 02/04/19 >> 344 IgE 10/18/21 >> 320  Chest Imaging:  CT chest 04/18/14 >> centrilobular emphysema  Social History:  She  reports that she quit smoking about 42 years ago. Her smoking use included cigarettes. She has a 10.00 pack-year smoking history. She has never used smokeless tobacco. She reports current alcohol use of about 1.0 standard drink of alcohol per week. She reports that she does not use drugs.  Family History:  Her family history is not on file.     Assessment/Plan:   COPD with asthma and eosinophilic phenotype, and chronic bronchitis. - intolerant of xolair in 2010 - started fasenra in September 2019; this is working well in controlling exacerbations and she will continue this - continue spiriva handihaler daily, symbicort 160 two puffs bid, and singulair 10 mg nightly - 100 mg theo 24 is not currently available, and it is uncertain if/when this will be available again - her respiratory status has been stable w/o theo-24; will continue to monitor off this medication - she tried daliresp years ago, but was ineffective then; might need to try this again if her symptoms get worse and theo-24 isn't available - prn albuterol, mucinex, flutter valve  Allergic rhinitis. - continue fasenra, singulair, xyzal, flonase  Time Spent Involved in Patient Care on Day of Examination:  I discussed the assessment and treatment plan with the patient. The patient was provided an  opportunity to ask questions and all were answered. The patient agreed with the plan and demonstrated an understanding of the instructions.   The patient was advised to call back or seek an in-person evaluation if the symptoms worsen or if the condition fails to improve as anticipated.  I provided 25 minutes of non-face-to-face time during this  encounter.  Follow up:   Patient Instructions  Follow up in 6 months  Medication List:   Allergies as of 03/23/2022       Reactions   Zoledronic Acid Other (See Comments)   Other reaction(s): Tachycardia, patient provider will decide if she had a true allergy.         Medication List        Accurate as of March 23, 2022  9:51 AM. If you have any questions, ask your nurse or doctor.          albuterol 108 (90 Base) MCG/ACT inhaler Commonly known as: ProAir HFA Inhale 2 puffs into the lungs every 6 (six) hours as needed.   benzonatate 200 MG capsule Commonly known as: TESSALON Take 1 capsule (200 mg total) by mouth 3 (three) times daily as needed for cough.   budesonide-formoterol 160-4.5 MCG/ACT inhaler Commonly known as: Symbicort Inhale 2 puffs into the lungs 2 (two) times daily.   CULTURELLE PO Take by mouth.   escitalopram 5 MG tablet Commonly known as: LEXAPRO Take 5 mg by mouth daily.   Fasenra 30 MG/ML Sosy Generic drug: Benralizumab Inject 30 mg into the skin every 8 (eight) weeks.   fluticasone 50 MCG/ACT nasal spray Commonly known as: FLONASE Place 2 sprays into both nostrils daily.   guaiFENesin 600 MG 12 hr tablet Commonly known as: MUCINEX Take 1,200 mg by mouth daily.   levocetirizine 5 MG tablet Commonly known as: XYZAL Take 1 tablet (5 mg total) by mouth every evening.   levothyroxine 75 MCG tablet Commonly known as: SYNTHROID Take 1 tablet by mouth daily.   Magnesium 100 MG Caps Take by mouth.   montelukast 10 MG tablet Commonly known as: SINGULAIR Take 1 tablet (10 mg total) by mouth at bedtime.   omeprazole 20 MG capsule Commonly known as: PRILOSEC TAKE 1 CAPSULE BY MOUTH TWICE DAILY 30 MINUTES BEFORE FIRST AND LAST MEAL   pregabalin 75 MG capsule Commonly known as: LYRICA Take 150 mg by mouth 2 (two) times daily.   simvastatin 10 MG tablet Commonly known as: ZOCOR Take 10 mg by mouth daily.   Spiriva HandiHaler  18 MCG inhalation capsule Generic drug: tiotropium INHALE 1 CAPSULE VIA INHALER EVERY DAY   Theo-24 100 MG 24 hr capsule Generic drug: theophylline TAKE 1 CAPSULE EVERY DAY What changed: Another medication with the same name was removed. Continue taking this medication, and follow the directions you see here. Changed by: Coralyn Helling, MD        Signature:  Coralyn Helling, MD [facility] Pulmonary/Critical Care Pager - [phone] 03/23/2022, 9:51 AM

## 2022-03-24 MED ORDER — ROFLUMILAST 250 MCG PO TABS: 1.0000 | ORAL_TABLET | Freq: Every day | ORAL | 0 refills | Status: DC

## 2022-03-24 NOTE — Telephone Encounter (Signed)
I have sent script for daliresp 250 mg daily.  She will need to take this dose for 28 days and call for a refill.  After 28 days her dose will need to increase to 500 mg daily.  Please let her know that daliresp can cause diarrhea, and she should call the office if this becomes a problem.

## 2022-03-24 NOTE — Telephone Encounter (Signed)
ATC patient. LVMTCB. 

## 2022-03-24 NOTE — Telephone Encounter (Signed)
Called and spoke with patient. Patient verbalized understanding. Nothing further needed.  

## 2022-03-31 ENCOUNTER — Telehealth: Payer: Self-pay | Admitting: Pulmonary Disease

## 2022-03-31 NOTE — Telephone Encounter (Signed)
Patient called to let the nurse know that the last medicine that was prescribed is not making much of a difference.  Please call her to discuss at [phone]

## 2022-04-01 NOTE — Telephone Encounter (Signed)
Spoke with the pt  She states that her breathing is no better with taking daliresp  She still has DOE when she walks up stairs  She uses albuterol and this does not provide any relief  She is taking her symbicort, facenra, singulair, spiriva  She asks if there is something else VS would rec for her to try  Please advise, thanks

## 2022-04-05 NOTE — Telephone Encounter (Signed)
Would she be able to come into the office for a visit?  She needs to do  chest xray, spirometry, and FeNO to determine whether her asthma with COPD is what is causing her shortness of breath, or if something else is causing this.

## 2022-04-06 NOTE — Telephone Encounter (Signed)
Called and spoke with pt and have scheduled her for an appt with BW. Nothing further needed.

## 2022-04-13 ENCOUNTER — Ambulatory Visit: Payer: Medicare PPO | Admitting: Primary Care

## 2022-04-13 ENCOUNTER — Encounter: Payer: Self-pay | Admitting: Primary Care

## 2022-04-13 ENCOUNTER — Ambulatory Visit (INDEPENDENT_AMBULATORY_CARE_PROVIDER_SITE_OTHER): Payer: Medicare PPO

## 2022-04-13 VITALS — BP 116/68 | HR 84 | Temp 98.0°F | Ht 64.0 in | Wt 185.4 lb

## 2022-04-13 DIAGNOSIS — Z23 Encounter for immunization: Secondary | ICD-10-CM | POA: Diagnosis not present

## 2022-04-13 DIAGNOSIS — J449 Chronic obstructive pulmonary disease, unspecified: Secondary | ICD-10-CM | POA: Diagnosis not present

## 2022-04-13 DIAGNOSIS — J45901 Unspecified asthma with (acute) exacerbation: Secondary | ICD-10-CM

## 2022-04-13 DIAGNOSIS — J441 Chronic obstructive pulmonary disease with (acute) exacerbation: Secondary | ICD-10-CM | POA: Diagnosis not present

## 2022-04-13 LAB — SPIROMETRY WITH GRAPH

## 2022-04-13 MED ORDER — ALBUTEROL SULFATE 0.63 MG/3ML IN NEBU: 1.0000 | INHALATION_SOLUTION | Freq: Four times a day (QID) | RESPIRATORY_TRACT | 12 refills | Status: DC | PRN

## 2022-04-13 MED ORDER — BREZTRI AEROSPHERE 160-9-4.8 MCG/ACT IN AERO: 2.0000 | INHALATION_SPRAY | Freq: Two times a day (BID) | RESPIRATORY_TRACT | 0 refills | Status: DC

## 2022-04-13 NOTE — Patient Instructions (Addendum)
Recommendations While waiting for Symbicort prescription to be delivered, stop Spiriva Handihaler and start new inhaler called Breztri.  Please take 2 puffs of Breztri every morning and evening.  Try this for 2 weeks and let me know if you prefer this inhaler over Symbicort and Spiriva.  If no noticeable difference you can resume old inhalers.  Continue Daliresp 250 mcg once daily, singular 10 mg at bedtime, Fasenra injections every 8 weeks  FENO was borderline, we can hold off on prednisone until all testing comes back Lung function is about 9% decreased from three years ago  Orders: Needs new nebulizer machine (please order) Spirometry and FENO re: asthma  CXR re: sob (ordered)  Labs today (ordered)  Rx: Breztri sample  Albuterol nebulizer solution (sent to [facility])  Follow-up: With Dr. Craige Cotta first available

## 2022-04-13 NOTE — Progress Notes (Signed)
@Patient  ID: , female    DOB: 03-Jun-1951, 71 y.o.   MRN: [id number]  Chief Complaint  Patient presents with   Follow-up    Referring provider: [id number], MD  HPI: 71 year old female, former smoker. Past medical history significant for COPD with asthma, allergic rhinitis, hypothyroidism. Patient of Dr. 65.  04/13/2022 Patient presents today for follow-up. She reports increased shortness of breath since being off theophylline for the last month. She does not feel Daliresp has helped. She gets winded with the slightest activity. She experiences occasional wheezing. No persistent cough. She has missed the last three days of taking maintenance inhalers d/t issues with mail delivery. Needs new nebulizer machine.    Allergies  Allergen Reactions   Zoledronic Acid Other (See Comments)    Other reaction(s): Tachycardia, patient provider will decide if she had a true allergy.     Immunization History  Administered Date(s) Administered   Fluad Quad(high Dose 65+) 03/15/2019, 04/30/2021, 04/13/2022   Influenza Split 06/04/2009, 04/02/2011, 05/04/2011, 04/25/2012, 04/01/2015   Influenza Whole 04/01/1997, 05/01/2012, 03/15/2014   Influenza, High Dose Seasonal PF 04/05/2017, 03/07/2018, 03/15/2019   Influenza,inj,Quad PF,6+ Mos 03/07/2016   Influenza-Unspecified 04/05/2017, 03/21/2020   Moderna Covid-19 Vaccine Bivalent Booster 20yrs & up 08/24/2021   Moderna Sars-Covid-2 Vaccination 09/02/2019, 09/30/2019, 05/22/2020, 10/30/2020   Pfizer Covid-19 Vaccine Bivalent Booster 38yrs & up 04/22/2021   Pneumococcal Conjugate-13 06/03/2015   Pneumococcal Polysaccharide-23 05/04/2007, 10/06/2010   Td 04/01/1997, 09/12/2005   Zoster, Live 02/04/2010    Past Medical History:  Diagnosis Date   Allergic rhinitis    Chronic obstructive asthma    Colon polyp    Esophageal stricture    Exercise-induced bronchoconstriction    GERD (gastroesophageal reflux disease)     Hypothyroidism    Osteoporosis    Pneumonia     Tobacco History: Social History   Tobacco Use  Smoking Status Former   Packs/day: 1.00   Years: 10.00   Total pack years: 10.00   Types: Cigarettes   Quit date: 08/02/1979   Years since quitting: 42.7  Smokeless Tobacco Never   Counseling given: Not Answered   Outpatient Medications Prior to Visit  Medication Sig Dispense Refill   albuterol (PROAIR HFA) 108 (90 Base) MCG/ACT inhaler Inhale 2 puffs into the lungs every 6 (six) hours as needed. 18 g 3   Benralizumab (FASENRA) 30 MG/ML SOSY Inject 30 mg into the skin every 8 (eight) weeks.     benzonatate (TESSALON) 200 MG capsule Take 1 capsule (200 mg total) by mouth 3 (three) times daily as needed for cough. 30 capsule 1   budesonide-formoterol (SYMBICORT) 160-4.5 MCG/ACT inhaler Inhale 2 puffs into the lungs 2 (two) times daily. 3 each 2   escitalopram (LEXAPRO) 5 MG tablet Take 5 mg by mouth daily.     fluticasone (FLONASE) 50 MCG/ACT nasal spray Place 2 sprays into both nostrils daily. 18.2 mL 3   guaiFENesin (MUCINEX) 600 MG 12 hr tablet Take 1,200 mg by mouth daily.      Lactobacillus Rhamnosus, GG, (CULTURELLE PO) Take by mouth.     levocetirizine (XYZAL) 5 MG tablet Take 1 tablet (5 mg total) by mouth every evening. 30 tablet 5   levothyroxine (SYNTHROID, LEVOTHROID) 75 MCG tablet Take 1 tablet by mouth daily.  3   Magnesium 100 MG CAPS Take by mouth.     montelukast (SINGULAIR) 10 MG tablet Take 1 tablet (10 mg total) by mouth at bedtime. 90 tablet 3  omeprazole (PRILOSEC) 20 MG capsule TAKE 1 CAPSULE BY MOUTH TWICE DAILY 30 MINUTES BEFORE FIRST AND LAST MEAL 60 capsule 0   pregabalin (LYRICA) 75 MG capsule Take 150 mg by mouth 2 (two) times daily.     Roflumilast (DALIRESP) 250 MCG TABS Take 1 tablet by mouth daily in the afternoon. 28 tablet 0   simvastatin (ZOCOR) 10 MG tablet Take 10 mg by mouth daily.     THEO-24 100 MG 24 hr capsule TAKE 1 CAPSULE EVERY DAY 90 capsule  1   tiotropium (SPIRIVA HANDIHALER) 18 MCG inhalation capsule INHALE 1 CAPSULE VIA INHALER EVERY DAY 90 capsule 3   No facility-administered medications prior to visit.    Review of Systems  Review of Systems  Constitutional:  Positive for fatigue.  Respiratory:  Positive for shortness of breath and wheezing. Negative for cough and chest tightness.     Physical Exam  BP 116/68 (BP Location: Left Arm, Patient Position: Sitting, Cuff Size: Normal)   Pulse 84   Temp 98 F (36.7 C) (Oral)   Ht 5\' 4"  (1.626 m)   Wt 185 lb 6.4 oz (84.1 kg)   SpO2 100%   BMI 31.82 kg/m  Physical Exam Constitutional:      Appearance: Normal appearance.  HENT:     Head: Normocephalic and atraumatic.  Cardiovascular:     Rate and Rhythm: Normal rate.  Pulmonary:     Effort: Pulmonary effort is normal.     Breath sounds: Wheezing present.  Neurological:     General: No focal deficit present.     Mental Status: She is alert and oriented to person, place, and time. Mental status is at baseline.  Psychiatric:        Mood and Affect: Mood normal.        Behavior: Behavior normal.        Thought Content: Thought content normal.        Judgment: Judgment normal.      Lab Results:  CBC    Component Value Date/Time   WBC 9.5 04/13/2022 1521   RBC 3.70 (L) 04/13/2022 1521   HGB 10.7 (L) 04/13/2022 1521   HCT 32.9 (L) 04/13/2022 1521   PLT 404.0 (H) 04/13/2022 1521   MCV 88.9 04/13/2022 1521   MCH 29.4 07/11/2018 1902   MCHC 32.4 04/13/2022 1521   RDW 15.8 (H) 04/13/2022 1521   LYMPHSABS 3.1 04/13/2022 1521   MONOABS 0.9 04/13/2022 1521   EOSABS 0.0 04/13/2022 1521   BASOSABS 0.0 04/13/2022 1521    BMET    Component Value Date/Time   NA 138 07/11/2018 1902   K 3.9 07/11/2018 1902   CL 106 07/11/2018 1902   CO2 24 07/11/2018 1902   GLUCOSE 109 (H) 07/11/2018 1902   BUN 14 07/11/2018 1902   CREATININE 0.82 07/11/2018 1902   CALCIUM 9.6 07/11/2018 1902   GFRNONAA >60 07/11/2018  1902   GFRAA >60 07/11/2018 1902    BNP No results found for: "BNP"  ProBNP    Component Value Date/Time   PROBNP 10.0 02/04/2019 1626    Imaging: DG Chest 2 View  Result Date: 04/13/2022 CLINICAL DATA:  Shortness of breath, exacerbation of asthma EXAM: CHEST - 2 VIEW COMPARISON:  Previous studies including the examination of 09/16/2018 FINDINGS: Transverse diameter of heart is increased. There are no signs of pulmonary edema or focal pulmonary consolidation. Patient's chin is partially obscuring both the apices. Low position of diaphragms may suggest COPD. There is no  pleural effusion or pneumothorax. IMPRESSION: Cardiomegaly. COPD. There are no signs of pulmonary edema or focal pulmonary consolidation. Electronically Signed   By: Ernie Avena M.D.   On: 04/13/2022 15:40   MM 3D SCREEN BREAST BILATERAL  Result Date: 03/23/2022 CLINICAL DATA:  Screening. EXAM: DIGITAL SCREENING BILATERAL MAMMOGRAM WITH TOMOSYNTHESIS AND CAD TECHNIQUE: Bilateral screening digital craniocaudal and mediolateral oblique mammograms were obtained. Bilateral screening digital breast tomosynthesis was performed. The images were evaluated with computer-aided detection. COMPARISON:  Previous exam(s). ACR Breast Density Category b: There are scattered areas of fibroglandular density. FINDINGS: There are no findings suspicious for malignancy. IMPRESSION: No mammographic evidence of malignancy. A result letter of this screening mammogram will be mailed directly to the patient. RECOMMENDATION: Screening mammogram in one year. (Code:SM-B-01Y) BI-RADS CATEGORY  1: Negative. Electronically Signed   By: Amie Portland M.D.   On: 03/23/2022 08:21     Assessment & Plan:   COPD with asthma (HCC) - Patient reports increased shortness of breath since being off Theo-24 this last month to back order. No improvement with Daliresp. She has wheezing on exam but does not want to be prescribed prednisone at this time. FENO was 25  which is borderline positive. Checking basic CXR and CBC with diff. Office spirometry today showed 9% decline in lung function in the last three years. We will trial her on Breztri Aerosphere 2 puffs twice daily. For now continue Daliresp 250 mcg once daily, singular 10 mg at bedtime and Fasenra injections every 8 weeks. Sending in RX for albuterol solutions and nebulizer machine. We will re-address need for prednisone after testing comes back. FU first available with Dr. Craige Cotta.   40 mins spent on case; 50% face to face with patient   Glenford Bayley, NP 04/17/2022

## 2022-04-14 LAB — CBC WITH DIFFERENTIAL/PLATELET
Basophils Absolute: 0 10*3/uL (ref 0.0–0.1)
Basophils Relative: 0.5 % (ref 0.0–3.0)
Eosinophils Absolute: 0 10*3/uL (ref 0.0–0.7)
Eosinophils Relative: 0 % (ref 0.0–5.0)
HCT: 32.9 % — ABNORMAL LOW (ref 36.0–46.0)
Hemoglobin: 10.7 g/dL — ABNORMAL LOW (ref 12.0–15.0)
Lymphocytes Relative: 33.3 % (ref 12.0–46.0)
Lymphs Abs: 3.1 10*3/uL (ref 0.7–4.0)
MCHC: 32.4 g/dL (ref 30.0–36.0)
MCV: 88.9 fl (ref 78.0–100.0)
Monocytes Absolute: 0.9 10*3/uL (ref 0.1–1.0)
Monocytes Relative: 9.4 % (ref 3.0–12.0)
Neutro Abs: 5.4 10*3/uL (ref 1.4–7.7)
Neutrophils Relative %: 56.8 % (ref 43.0–77.0)
Platelets: 404 10*3/uL — ABNORMAL HIGH (ref 150.0–400.0)
RBC: 3.7 Mil/uL — ABNORMAL LOW (ref 3.87–5.11)
RDW: 15.8 % — ABNORMAL HIGH (ref 11.5–15.5)
WBC: 9.5 10*3/uL (ref 4.0–10.5)

## 2022-04-15 ENCOUNTER — Telehealth: Payer: Self-pay | Admitting: Primary Care

## 2022-04-15 DIAGNOSIS — J441 Chronic obstructive pulmonary disease with (acute) exacerbation: Secondary | ICD-10-CM

## 2022-04-17 NOTE — Progress Notes (Signed)
CXR showed cardiomegaly and COPD, no acute process.

## 2022-04-17 NOTE — Assessment & Plan Note (Addendum)
-   Patient reports increased shortness of breath since being off Theo-24 this last month to back order. No improvement with Daliresp. She has wheezing on exam but does not want to be prescribed prednisone at this time. FENO was 25 which is borderline positive. Checking basic CXR and CBC with diff. Office spirometry today showed 9% decline in lung function in the last three years. We will trial her on Breztri Aerosphere 2 puffs twice daily. For now continue Daliresp 250 mcg once daily, singular 10 mg at bedtime and Fasenra injections every 8 weeks. Sending in RX for albuterol solutions and nebulizer machine. We will re-address need for prednisone after testing comes back. FU first available with Dr. Halford Chessman.

## 2022-04-18 ENCOUNTER — Other Ambulatory Visit: Payer: Self-pay | Admitting: Pulmonary Disease

## 2022-04-18 ENCOUNTER — Telehealth: Payer: Self-pay | Admitting: Pulmonary Disease

## 2022-04-18 NOTE — Telephone Encounter (Signed)
Called patient and she states that she feels like she is having another flare up of her copd. She states that the new medication Daliresp is not working for her. She states that she normally comes in for a shot when she feels like this.   Will order her neb machine because it was not ordered last office visit.   Please advise Dr Halford Chessman

## 2022-04-18 NOTE — Progress Notes (Signed)
Reviewed and agree with assessment/plan.   Daviyon Widmayer, MD [place] [facility] 04/18/2022, 10:41 PM Pager:  [phone]  

## 2022-04-18 NOTE — Telephone Encounter (Signed)
New order placed for nebulizer machine. Called and notified patient. Nothing further needed

## 2022-04-19 MED ORDER — PREDNISONE 10 MG PO TABS: ORAL_TABLET | ORAL | 0 refills | Status: AC

## 2022-04-19 NOTE — Telephone Encounter (Signed)
CBC was fine. No evidence of infection. Wbc and eos were normal. She was slightly anemic but appears consistent with her baseline.

## 2022-04-19 NOTE — Telephone Encounter (Signed)
Can send script for prednisone 10 mg pill >> 3 pills daily for 2 days, 2 pills daily for 2 days, 1 pill daily for 2 days. 

## 2022-04-19 NOTE — Telephone Encounter (Signed)
Called and spoke with patient. She verbalized understanding. Prednisone has been sent in for her.   Nothing further needed at time of call.  

## 2022-04-19 NOTE — Telephone Encounter (Signed)
Called and spoke with patient. She verbalized understanding of Dr. Juanetta Gosling recs. She stayed that since she did not hear back before the office closed yesterday, she went to an urgent care clinic. She received a steroid injection last night at the clinic.   She wants to know if she should take the prednisone considering her had the injection last night. She has not noticed any relief yet.   She also wanted to know if Eustaquio Maize could look at her lab work from last week.   Beth, can you please advise since Dr. Halford Chessman is working nights this week? Thanks!

## 2022-04-19 NOTE — Telephone Encounter (Signed)
Called patient but she did not answer. Left message for patient to call back.  

## 2022-04-20 ENCOUNTER — Other Ambulatory Visit (HOSPITAL_COMMUNITY): Payer: Self-pay

## 2022-04-20 ENCOUNTER — Telehealth: Payer: Self-pay

## 2022-04-20 NOTE — Telephone Encounter (Signed)
Atc patient back. LVM letting her know medication was approved by insurance.

## 2022-04-20 NOTE — Telephone Encounter (Signed)
Received notification from [facility] regarding a prior authorization for Roflumilast 250MCG tablets.   Authorization has been APPROVED from 04-20-2022 to 07-31-2022.   Per test claim, copay for 28 days supply is $10  Key: [id number] PA Case ID: [id number]

## 2022-04-21 NOTE — Telephone Encounter (Signed)
Patient called back and while I was reviewing her last avs with Dr. Halford Chessman with her, noted that Roflumilast should be 500 mcg instead of 250 mcg when she gets refill after one month of taking. Patient has completed first month of this medication. Patient states she has seen little difference with medication so far but is willing to double dosage and try for one more month if Dr. Halford Chessman  wants her to try this still.   Dr. Halford Chessman please advise, do you want [place] to order this for 500 mcg?

## 2022-04-22 ENCOUNTER — Other Ambulatory Visit (HOSPITAL_COMMUNITY): Payer: Self-pay

## 2022-04-22 MED ORDER — ROFLUMILAST 500 MCG PO TABS: 500.0000 ug | ORAL_TABLET | Freq: Every day | ORAL | 0 refills | Status: DC

## 2022-04-22 NOTE — Telephone Encounter (Signed)
New order placed. PA team, will this need a new PA since dosage was changed?

## 2022-04-22 NOTE — Telephone Encounter (Signed)
No she will not need another PA. I called pharmacy and it went though co pay of $10.00   [facility]

## 2022-04-22 NOTE — Telephone Encounter (Signed)
Called and notified patient of Dr. Collie Siad recommendations and response from [facility] team about cost of medication. She voiced understanding. Will call at end of course with 500 mcg and let us know if shed like to continue or not. Nothing further needed

## 2022-04-22 NOTE — Telephone Encounter (Signed)
Okay to send script for daliresp 500 mg

## 2022-04-22 NOTE — Addendum Note (Signed)
Addended by: Fritzi Mandes D on: 04/22/2022 08:02 AM   Modules accepted: Orders

## 2022-05-03 ENCOUNTER — Encounter: Payer: Self-pay | Admitting: Primary Care

## 2022-05-03 ENCOUNTER — Ambulatory Visit: Payer: Medicare PPO | Admitting: Primary Care

## 2022-05-03 DIAGNOSIS — J4489 Other specified chronic obstructive pulmonary disease: Secondary | ICD-10-CM | POA: Diagnosis not present

## 2022-05-03 MED ORDER — PREDNISONE 5 MG PO TABS: 5.0000 mg | ORAL_TABLET | Freq: Every day | ORAL | 0 refills | Status: DC

## 2022-05-03 NOTE — Progress Notes (Signed)
Reviewed and agree with assessment/plan.   Chesley Mires, MD [facility] 05/03/2022, 1:51 PM Pager:  [phone]

## 2022-05-03 NOTE — Progress Notes (Signed)
@Patient  ID: Mary [name], female    DOB: October 02, 1950, 71 y.o.   MRN: [id number]  Chief Complaint  Patient presents with   Follow-up    Referring provider: Glendon Axe, MD  HPI: 71 year old female, former smoker. Past medical history significant for COPD with asthma, allergic rhinitis, hypothyroidism. Patient of Dr. Halford Chessman.  Previous [facility] pulmonary encounter:  04/13/2022 Patient presents today for follow-up. She reports increased shortness of breath since being off theophylline for the last month. She does not feel Daliresp has helped. She gets winded with the slightest activity. She experiences occasional wheezing. No persistent cough. She has missed the last three days of taking maintenance inhalers d/t issues with mail delivery. Needs new nebulizer machine.   COPD with asthma  - Patient reports increased shortness of breath since being off Theo-24 this last month to back order. No improvement with Daliresp. She has wheezing on exam but does not want to be prescribed prednisone at this time. FENO was 25 which is borderline positive. Checking basic CXR and CBC with diff. Office spirometry today showed 9% decline in lung function in the last three years. We will trial her on Breztri Aerosphere 2 puffs twice daily. For now continue Daliresp 250 mcg once daily, singular 10 mg at bedtime and Fasenra injections every 8 weeks. Sending in RX for albuterol solutions and nebulizer machine. We will re-address need for prednisone after testing comes back. FU first available with Dr. Halford Chessman.   05/03/2022- Interim hx  Patient presents today for 3 week follow-up. She was seen in mid-September for increased shortness of breath symptoms since being of Theo-24. She saw no improvement with addition of Daliresp. We gave her a trial of Breztri. She is maintained on Singulair and Fasenra. CXR 04/13/22 showed cardiomegaly and COPD. No signs of pulmonary edema or focal pulmonary congestion. FENO 25. Eosinophil  absolute 0.   She is doing about the same as she was last visit. She is no better or worse. She saw a bit of improvement on prednisone. She is back on Theo-24. She has stopped Daliresp. She received Spiriva from [facility]. Breathing was the same with [facility] but she did find it more convenient and would like to have prescription send in 3 months when she finishes her current supply of Symb/Spiriva.     Allergies  Allergen Reactions   Zoledronic Acid Other (See Comments)    Other reaction(s): Tachycardia, patient provider will decide if she had a true allergy.     Immunization History  Administered Date(s) Administered   Fluad Quad(high Dose 65+) 03/15/2019, 04/30/2021, 04/13/2022   Influenza Split 06/04/2009, 04/02/2011, 05/04/2011, 04/25/2012, 04/01/2015   Influenza Whole 04/01/1997, 05/01/2012, 03/15/2014   Influenza, High Dose Seasonal PF 04/05/2017, 03/07/2018, 03/15/2019   Influenza,inj,Quad PF,6+ Mos 03/07/2016   Influenza-Unspecified 04/05/2017, 03/21/2020   Moderna Covid-19 Vaccine Bivalent Booster 33yrs & up 08/24/2021   Moderna Sars-Covid-2 Vaccination 09/02/2019, 09/30/2019, 05/22/2020, 10/30/2020   Pfizer Covid-19 Vaccine Bivalent Booster 85yrs & up 04/22/2021   Pneumococcal Conjugate-13 06/03/2015   Pneumococcal Polysaccharide-23 05/04/2007, 10/06/2010   Td 04/01/1997, 09/12/2005   Zoster, Live 02/04/2010    Past Medical History:  Diagnosis Date   Allergic rhinitis    Chronic obstructive asthma    Colon polyp    Esophageal stricture    Exercise-induced bronchoconstriction    GERD (gastroesophageal reflux disease)    Hypothyroidism    Osteoporosis    Pneumonia     Tobacco History: Social History   Tobacco Use  Smoking  Status Former   Packs/day: 1.00   Years: 10.00   Total pack years: 10.00   Types: Cigarettes   Quit date: 08/02/1979   Years since quitting: 42.7  Smokeless Tobacco Never   Counseling given: Not Answered   Outpatient Medications  Prior to Visit  Medication Sig Dispense Refill   albuterol (ACCUNEB) 0.63 MG/3ML nebulizer solution Take 3 mLs (0.63 mg total) by nebulization every 6 (six) hours as needed for wheezing. 75 mL 12   albuterol (PROAIR HFA) 108 (90 Base) MCG/ACT inhaler Inhale 2 puffs into the lungs every 6 (six) hours as needed. 18 g 3   Benralizumab (FASENRA) 30 MG/ML SOSY Inject 30 mg into the skin every 8 (eight) weeks.     benzonatate (TESSALON) 200 MG capsule Take 1 capsule (200 mg total) by mouth 3 (three) times daily as needed for cough. 30 capsule 1   Budeson-Glycopyrrol-Formoterol (BREZTRI AEROSPHERE) 160-9-4.8 MCG/ACT AERO Inhale 2 puffs into the lungs in the morning and at bedtime. 5.9 g 0   budesonide-formoterol (SYMBICORT) 160-4.5 MCG/ACT inhaler Inhale 2 puffs into the lungs 2 (two) times daily. 3 each 2   escitalopram (LEXAPRO) 5 MG tablet Take 5 mg by mouth daily.     fluticasone (FLONASE) 50 MCG/ACT nasal spray Place 2 sprays into both nostrils daily. 18.2 mL 3   guaiFENesin (MUCINEX) 600 MG 12 hr tablet Take 1,200 mg by mouth daily.      Lactobacillus Rhamnosus, GG, (CULTURELLE PO) Take by mouth.     levocetirizine (XYZAL) 5 MG tablet Take 1 tablet (5 mg total) by mouth every evening. 30 tablet 5   levothyroxine (SYNTHROID, LEVOTHROID) 75 MCG tablet Take 1 tablet by mouth daily.  3   Magnesium 100 MG CAPS Take by mouth.     montelukast (SINGULAIR) 10 MG tablet Take 1 tablet (10 mg total) by mouth at bedtime. 90 tablet 3   omeprazole (PRILOSEC) 20 MG capsule TAKE 1 CAPSULE BY MOUTH TWICE DAILY 30 MINUTES BEFORE FIRST AND LAST MEAL 60 capsule 0   pregabalin (LYRICA) 75 MG capsule Take 150 mg by mouth 2 (two) times daily.     simvastatin (ZOCOR) 10 MG tablet Take 10 mg by mouth daily.     THEO-24 100 MG 24 hr capsule TAKE 1 CAPSULE EVERY DAY 90 capsule 1   tiotropium (SPIRIVA HANDIHALER) 18 MCG inhalation capsule INHALE 1 CAPSULE VIA INHALER EVERY DAY 90 capsule 3   roflumilast (DALIRESP) 500 MCG  TABS tablet Take 1 tablet (500 mcg total) by mouth daily. 30 tablet 0   No facility-administered medications prior to visit.    Review of Systems  Review of Systems  Constitutional: Negative.   HENT: Negative.    Respiratory:  Positive for shortness of breath and wheezing. Negative for cough.   Cardiovascular: Negative.    Physical Exam  BP 120/70 (BP Location: Right Arm, Patient Position: Sitting, Cuff Size: Normal)   Pulse 79   Temp 99 F (37.2 C) (Oral)   Ht 5\' 4"  (1.626 m)   Wt 183 lb 9.6 oz (83.3 kg)   SpO2 100%   BMI 31.51 kg/m  Physical Exam Constitutional:      Appearance: Normal appearance.  HENT:     Head: Normocephalic and atraumatic.     Mouth/Throat:     Mouth: Mucous membranes are moist.     Pharynx: Oropharynx is clear.  Cardiovascular:     Rate and Rhythm: Normal rate and regular rhythm.  Pulmonary:  Effort: Pulmonary effort is normal.     Breath sounds: Normal breath sounds. No wheezing, rhonchi or rales.     Comments: No wheezing  Skin:    General: Skin is warm and dry.  Neurological:     General: No focal deficit present.     Mental Status: She is alert and oriented to person, place, and time. Mental status is at baseline.  Psychiatric:        Mood and Affect: Mood normal.        Behavior: Behavior normal.        Thought Content: Thought content normal.        Judgment: Judgment normal.      Lab Results:  CBC    Component Value Date/Time   WBC 9.5 04/13/2022 1521   RBC 3.70 (L) 04/13/2022 1521   HGB 10.7 (L) 04/13/2022 1521   HCT 32.9 (L) 04/13/2022 1521   PLT 404.0 (H) 04/13/2022 1521   MCV 88.9 04/13/2022 1521   MCH 29.4 07/11/2018 1902   MCHC 32.4 04/13/2022 1521   RDW 15.8 (H) 04/13/2022 1521   LYMPHSABS 3.1 04/13/2022 1521   MONOABS 0.9 04/13/2022 1521   EOSABS 0.0 04/13/2022 1521   BASOSABS 0.0 04/13/2022 1521    BMET    Component Value Date/Time   NA 138 07/11/2018 1902   K 3.9 07/11/2018 1902   CL 106 07/11/2018  1902   CO2 24 07/11/2018 1902   GLUCOSE 109 (H) 07/11/2018 1902   BUN 14 07/11/2018 1902   CREATININE 0.82 07/11/2018 1902   CALCIUM 9.6 07/11/2018 1902   GFRNONAA >60 07/11/2018 1902   GFRAA >60 07/11/2018 1902    BNP No results found for: "BNP"  ProBNP    Component Value Date/Time   PROBNP 10.0 02/04/2019 1626    Imaging: DG Chest 2 View  Result Date: 04/13/2022 CLINICAL DATA:  Shortness of breath, exacerbation of asthma EXAM: CHEST - 2 VIEW COMPARISON:  Previous studies including the examination of 09/16/2018 FINDINGS: Transverse diameter of heart is increased. There are no signs of pulmonary edema or focal pulmonary consolidation. Patient's chin is partially obscuring both the apices. Low position of diaphragms may suggest COPD. There is no pleural effusion or pneumothorax. IMPRESSION: Cardiomegaly. COPD. There are no signs of pulmonary edema or focal pulmonary consolidation. Electronically Signed   By: Elmer Picker M.D.   On: 04/13/2022 15:40     Assessment & Plan:   COPD with asthma ([place]) - Stable; She reports increased shortness of breath x 1 month since being off Theo-24 due to back order. She has since resumed medication. No longer taking Daliresp. CXR showed cardiomegaly and COPD. FENO was borderline at 25. Eos were 0. She is maintained on Symbicort 160 + Spiriva handihaler and Fasenra q 8 weeks. She did like the convenience of Breztri Aerosphere when given temporary sample. Recommend adding daily prednisone 5mg  until better. Advised she follow-up with cardiology regarding cardiomegaly, may want additional work up including echocardiogram. FU in 3 months or sooner.      Martyn Ehrich, NP 05/03/2022

## 2022-05-03 NOTE — Assessment & Plan Note (Addendum)
-   Stable; She reports increased shortness of breath x 1 month since being off Theo-24 due to back order. She has since resumed medication. No longer taking Daliresp. CXR showed cardiomegaly and COPD. FENO was borderline at 25. Eos were 0. She is maintained on Symbicort 160 + Spiriva handihaler and Fasenra q 8 weeks. She did like the convenience of Breztri Aerosphere when given temporary sample. Recommend adding daily prednisone 5mg  until better. Advised she follow-up with cardiology regarding cardiomegaly, may want additional work up including echocardiogram. FU in 3 months or sooner.

## 2022-05-03 NOTE — Patient Instructions (Addendum)
Chest x-ray showed findings of cardiomegaly and COPD  Recommend you call either your primary care doctor or cardiologist for a follow-up due to cardiomegaly, would recommend potentially getting additional work-up including echocardiogram shortness of breath and chest x-ray findings  Recommendations Continue Symbicort 2 puffs twice daily Continue Spiriva 1 capsule daily Use albuterol rescue inhaler 2 puffs every 6 hours as needed for breakthrough shortness of breath or wheezing Continue with Fasenra injections every 8 weeks Continue Singulair 10 mg at bedtime Continue theophylline as directed Once you have used up 90 days Spiriva, please call our office and we can change you to [employer] 5 mg of prednisone daily until you are back to baseline and we can try you off medication Discontinue Daliresp  Follow-up 3 months with Dr. Halford Chessman or sooner if needed

## 2022-05-09 ENCOUNTER — Ambulatory Visit: Payer: Medicare PPO

## 2022-05-13 ENCOUNTER — Ambulatory Visit (INDEPENDENT_AMBULATORY_CARE_PROVIDER_SITE_OTHER): Payer: Medicare PPO

## 2022-05-13 VITALS — BP 117/75 | HR 90 | Temp 98.1°F | Resp 16 | Ht 64.0 in | Wt 186.2 lb

## 2022-05-13 DIAGNOSIS — J4550 Severe persistent asthma, uncomplicated: Secondary | ICD-10-CM | POA: Diagnosis not present

## 2022-05-13 MED ORDER — BENRALIZUMAB 30 MG/ML ~~LOC~~ SOSY
30.0000 mg | PREFILLED_SYRINGE | Freq: Once | SUBCUTANEOUS | Status: AC
  Administered 2022-05-13: 30 mg via SUBCUTANEOUS
  Filled 2022-05-13: qty 1

## 2022-05-13 NOTE — Progress Notes (Signed)
Diagnosis: Asthma  Provider:  Praveen Mannam MD  Procedure: Injection  Fasenra (Benralizumab), Dose: 30 mg, Site: subcutaneous, Number of injections: 1  Post Care: Patient declined observation  Discharge: Condition: Good, Destination: Home . AVS provided to patient.   Performed by:  Quillan Whitter E Tanetta Fuhriman, LPN       

## 2022-05-20 ENCOUNTER — Other Ambulatory Visit: Payer: Self-pay | Admitting: Pulmonary Disease

## 2022-06-20 ENCOUNTER — Telehealth: Payer: Self-pay | Admitting: Pulmonary Disease

## 2022-06-20 NOTE — Telephone Encounter (Signed)
Trelegy is not showing on pt's current med list nor was it mentioned during last OV. Attempted to call pt but unable to reach.  Left message for her to return call.

## 2022-06-20 NOTE — Telephone Encounter (Signed)
Called and spoke with pt who is requesting to have an Rx fo Trelegy 200 sent to pharmacy. Pt said a prior time when she went to UC due to having an exacerbation, they gave her a sample and she states that she feels like the Trelegy has been helping with her breathing.  Dr. Craige Cotta, please advise on this for pt.

## 2022-06-21 MED ORDER — TRELEGY ELLIPTA 200-62.5-25 MCG/ACT IN AEPB: 1.0000 | INHALATION_SPRAY | Freq: Every day | RESPIRATORY_TRACT | 5 refills | Status: DC

## 2022-06-21 NOTE — Telephone Encounter (Signed)
Called and spoke to patient and went over the recommendations from Dr Craige Cotta. She verbalized understanding. Nothing further needed

## 2022-06-21 NOTE — Telephone Encounter (Signed)
Please let her know I have sent a script for trelegy 200 one puff daily, and advise her to rinse her mouth after each use.    She should stop symbicort and spiriva once she starts trelegy.  Markus Daft was also in her medication list - she should not be taking this either.

## 2022-07-06 DIAGNOSIS — I509 Heart failure, unspecified: Secondary | ICD-10-CM | POA: Insufficient documentation

## 2022-07-06 DIAGNOSIS — I517 Cardiomegaly: Secondary | ICD-10-CM | POA: Insufficient documentation

## 2022-07-08 ENCOUNTER — Ambulatory Visit: Payer: Medicare PPO

## 2022-07-11 ENCOUNTER — Ambulatory Visit (INDEPENDENT_AMBULATORY_CARE_PROVIDER_SITE_OTHER): Payer: Medicare PPO

## 2022-07-11 VITALS — BP 135/83 | HR 78 | Temp 97.9°F | Resp 18 | Ht 64.0 in | Wt 197.0 lb

## 2022-07-11 DIAGNOSIS — J4550 Severe persistent asthma, uncomplicated: Secondary | ICD-10-CM | POA: Diagnosis not present

## 2022-07-11 MED ORDER — BENRALIZUMAB 30 MG/ML ~~LOC~~ SOSY
30.0000 mg | PREFILLED_SYRINGE | Freq: Once | SUBCUTANEOUS | Status: AC
  Administered 2022-07-11: 30 mg via SUBCUTANEOUS
  Filled 2022-07-11: qty 1

## 2022-07-11 NOTE — Progress Notes (Signed)
Diagnosis: Asthma  Provider:  Praveen Mannam MD  Procedure: Injection  Fasenra (Benralizumab), Dose: 30 mg, Site: subcutaneous, Number of injections: 1  Post Care:     Discharge: Condition: Good, Destination: Home . AVS provided to patient.   Performed by:  Hadlyn Amero, RN       

## 2022-07-18 DIAGNOSIS — N1330 Unspecified hydronephrosis: Secondary | ICD-10-CM | POA: Insufficient documentation

## 2022-07-18 DIAGNOSIS — E669 Obesity, unspecified: Secondary | ICD-10-CM | POA: Insufficient documentation

## 2022-07-21 ENCOUNTER — Telehealth: Payer: Self-pay | Admitting: Primary Care

## 2022-07-21 MED ORDER — THEOPHYLLINE ER 100 MG PO CP24: 100.0000 mg | ORAL_CAPSULE | Freq: Every day | ORAL | 1 refills | Status: DC

## 2022-07-21 NOTE — Telephone Encounter (Signed)
PT calling for refill call in to [facility] @ [employer].  RX= Theo # 24  Any [phone]

## 2022-07-21 NOTE — Telephone Encounter (Signed)
Called and spoke to patient and verified that she needed her Theo-24 refilled. Looked at last AVS to determine if she was still requiring this medication, and she is. Verified pharmacy with patient over the phone. Nothing further needed

## 2022-07-22 DIAGNOSIS — G4710 Hypersomnia, unspecified: Secondary | ICD-10-CM | POA: Insufficient documentation

## 2022-08-02 ENCOUNTER — Ambulatory Visit (HOSPITAL_BASED_OUTPATIENT_CLINIC_OR_DEPARTMENT_OTHER): Payer: Medicare PPO | Admitting: Pulmonary Disease

## 2022-08-08 ENCOUNTER — Encounter (HOSPITAL_COMMUNITY): Payer: Self-pay | Admitting: Geriatric Medicine

## 2022-08-09 ENCOUNTER — Encounter: Payer: Self-pay | Admitting: Pulmonary Disease

## 2022-08-10 ENCOUNTER — Telehealth: Payer: Self-pay | Admitting: Pharmacy Technician

## 2022-08-10 ENCOUNTER — Other Ambulatory Visit: Payer: Self-pay | Admitting: Pharmacy Technician

## 2022-08-10 NOTE — Telephone Encounter (Signed)
Pa renewal:  Auth Submission: APPROVED Payer: [employer] MED[employer] Medication & CPT/J Code(s) submitted: Berna Bue (Benralizumab) [phone] Route of submission (phone, fax, portal): COVER MY MEDS Phone # Fax # Auth type: Buy/Bill Units/visits requested: 6 Reference number: [id number] [place] Approval from: 08/09/22 to 08/01/23

## 2022-08-15 ENCOUNTER — Ambulatory Visit (HOSPITAL_COMMUNITY): Payer: Medicare PPO | Attending: Cardiology

## 2022-08-15 ENCOUNTER — Other Ambulatory Visit (HOSPITAL_COMMUNITY): Payer: Self-pay | Admitting: Geriatric Medicine

## 2022-08-15 ENCOUNTER — Other Ambulatory Visit: Payer: Self-pay | Admitting: *Deleted

## 2022-08-15 DIAGNOSIS — I517 Cardiomegaly: Secondary | ICD-10-CM | POA: Diagnosis present

## 2022-08-15 DIAGNOSIS — I509 Heart failure, unspecified: Secondary | ICD-10-CM | POA: Diagnosis present

## 2022-08-15 LAB — ECHOCARDIOGRAM COMPLETE
Area-P 1/2: 2.71 cm2
S' Lateral: 2.6 cm

## 2022-08-15 MED ORDER — MONTELUKAST SODIUM 10 MG PO TABS: 10.0000 mg | ORAL_TABLET | Freq: Every day | ORAL | 3 refills | Status: AC

## 2022-08-23 ENCOUNTER — Ambulatory Visit (INDEPENDENT_AMBULATORY_CARE_PROVIDER_SITE_OTHER): Payer: Medicare PPO

## 2022-08-23 ENCOUNTER — Ambulatory Visit: Payer: Medicare PPO | Admitting: Podiatry

## 2022-08-23 ENCOUNTER — Encounter: Payer: Self-pay | Admitting: Podiatry

## 2022-08-23 VITALS — BP 120/65 | HR 80

## 2022-08-23 DIAGNOSIS — M2041 Other hammer toe(s) (acquired), right foot: Secondary | ICD-10-CM | POA: Diagnosis not present

## 2022-08-23 DIAGNOSIS — M21611 Bunion of right foot: Secondary | ICD-10-CM | POA: Diagnosis not present

## 2022-08-23 NOTE — Progress Notes (Signed)
  Subjective:  Patient ID: Mary [name], female    DOB: 25-Oct-1950,   MRN: [id number]  Chief Complaint  Patient presents with   Hammer Toe    Patient is here for right foot hammertoe.    72 y.o. female presents for concern of right foot hammertoe that has been present for years but recently started to worsen. Has seen a podiatrist in the past for this and tried PT but toe has only worsened and now rubbing on the tops of her shoes and causing pain . Denies any other pedal complaints. Denies n/v/f/c.   Past Medical History:  Diagnosis Date   Allergic rhinitis    Chronic obstructive asthma    Colon polyp    Esophageal stricture    Exercise-induced bronchoconstriction    GERD (gastroesophageal reflux disease)    Hypothyroidism    Osteoporosis    Pneumonia     Objective:  Physical Exam: Vascular: DP/PT pulses 2/4 bilateral. CFT <3 seconds. Normal hair growth on digits. No edema.  Skin. No lacerations or abrasions bilateral feet.  Musculoskeletal: MMT 5/5 bilateral lower extremities in DF, PF, Inversion and Eversion. Deceased ROM in DF of ankle joint. Moderate bunion deformity with hammered digits 2-4 and rubbing noted to the dorsum of the right second digit.  Neurological: Sensation intact to light touch.   Assessment:   1. Hammertoe of right foot   2. Bunion, right foot      Plan:  Patient was evaluated and treated and all questions answered. X-rays reviewed and discussed with patient. No acute fractures or dislocations noted. Hammered digits 2-4 with moderate bunion deformity noted.  -X-rays reviewed. -Educated on hammertoes and treatment options  -Discussed padding including toe caps and crest pads.  -Discussed need for potential surgery if pain does not improved.  -Patient to follow-up as needed. Discussed calling if any changes or increased pain.    Lorenda Peck, DPM

## 2022-09-05 ENCOUNTER — Ambulatory Visit (INDEPENDENT_AMBULATORY_CARE_PROVIDER_SITE_OTHER): Payer: Medicare PPO | Admitting: Pulmonary Disease

## 2022-09-05 ENCOUNTER — Ambulatory Visit (HOSPITAL_BASED_OUTPATIENT_CLINIC_OR_DEPARTMENT_OTHER): Payer: Medicare PPO | Admitting: Pulmonary Disease

## 2022-09-05 ENCOUNTER — Encounter (HOSPITAL_BASED_OUTPATIENT_CLINIC_OR_DEPARTMENT_OTHER): Payer: Self-pay | Admitting: Pulmonary Disease

## 2022-09-05 ENCOUNTER — Ambulatory Visit (INDEPENDENT_AMBULATORY_CARE_PROVIDER_SITE_OTHER): Payer: Medicare PPO

## 2022-09-05 VITALS — BP 110/68 | HR 73 | Temp 98.9°F | Ht 64.0 in | Wt 185.0 lb

## 2022-09-05 VITALS — BP 138/84 | HR 93 | Temp 98.3°F | Resp 18 | Ht 64.0 in | Wt 185.4 lb

## 2022-09-05 DIAGNOSIS — I340 Nonrheumatic mitral (valve) insufficiency: Secondary | ICD-10-CM

## 2022-09-05 DIAGNOSIS — J449 Chronic obstructive pulmonary disease, unspecified: Secondary | ICD-10-CM

## 2022-09-05 DIAGNOSIS — J4550 Severe persistent asthma, uncomplicated: Secondary | ICD-10-CM | POA: Diagnosis not present

## 2022-09-05 DIAGNOSIS — J4489 Other specified chronic obstructive pulmonary disease: Secondary | ICD-10-CM

## 2022-09-05 MED ORDER — BENRALIZUMAB 30 MG/ML ~~LOC~~ SOSY
30.0000 mg | PREFILLED_SYRINGE | Freq: Once | SUBCUTANEOUS | Status: AC
  Administered 2022-09-05: 30 mg via SUBCUTANEOUS
  Filled 2022-09-05: qty 1

## 2022-09-05 MED ORDER — ALBUTEROL SULFATE HFA 108 (90 BASE) MCG/ACT IN AERS: 2.0000 | INHALATION_SPRAY | Freq: Four times a day (QID) | RESPIRATORY_TRACT | 3 refills | Status: AC | PRN

## 2022-09-05 NOTE — Progress Notes (Signed)
Diagnosis: Asthma  Provider:  Praveen Mannam MD  Procedure: Injection  Fasenra (Benralizumab), Dose: 30 mg, Site: subcutaneous, Number of injections: 1  Post Care:     Discharge: Condition: Good, Destination: Home . AVS provided to patient.   Performed by:  Aniket Paye, RN       

## 2022-09-05 NOTE — Patient Instructions (Signed)
Will arrange for cardiology referral to assess mitral regurgitation  Follow up in 6 months

## 2022-09-05 NOTE — Progress Notes (Signed)
[facility]  Chief Complaint  Patient presents with   Follow-up    Feeling alright     Constitutional:  BP 110/68 (BP Location: Left Arm, Cuff Size: Normal)   Pulse 73   Temp 98.9 F (37.2 C) (Oral)   Ht 5\' 4"  (1.626 m)   Wt 185 lb (83.9 kg)   SpO2 99%   BMI 31.76 kg/m   Past Medical History:  PNA, Osteoporosis, Hypothyroidism, GERD, Esophageal stricture, Colon polyp, Nephrolithiasis, b/l hydronephrosis  Past Surgical History:  She  has a past surgical history that includes Esophageal dilation and Tubal ligation.  Brief Summary:  Mary [name] is a 72 y.o. female with former smoker with COPD/asthma with eosinophilic phenotype, allergic rhinitis, and exercise induced bronchoconstriction.      Subjective:   She saw Geraldo Pitter last fall.  CXR from September showed changes from COPD and asthma.  She was started on trelegy.  This has helped.  She doesn't feel like she needs theo-24 anymore.  Remains on fasenra and this has helped.  Hasn't need to use albuterol much.  Not having cough, wheeze, or sputum.  Gets winded walking up stairs, but breathing okay otherwise.  Echo showed mild to moderate mitral regurgitation.  She has not seen cardiology in several years, and would like a new cardiologist in [facility] system.   Physical Exam:   Appearance - well kempt   ENMT - no sinus tenderness, no oral exudate, no LAN, Mallampati 2 airway, no stridor  Respiratory - equal breath sounds bilaterally, no wheezing or rales  CV - s1s2 regular rate and rhythm, no murmurs  Ext - no clubbing, no edema  Skin - no rashes  Psych - normal mood and affect      Pulmonary testing:  RAST 02/28/08 >> IgE 327.6, multiple allergens PFT 02/28/08 >> FEV1 1.44(58%), FEV1% 48, TLC 5.25(99%), DLCO 76%   PFT 02/05/10 >> FEV1 1.44(60%), FEV1% 51, TLC 5.22(99%), DLCO 78%, +BD   Started xolair 08/14/08 >> stopped 09/10 (pt intolerant of  medication) Spirometry 09/12/11 >> FEV1 1.28(60%), FEV1% 55 Spirometry 12/04/14 >> FEV1 1.22 (62%), FEV1% 52 RAST 02/03/17 >> dog, cat, dust mite, mold, tree bark, IgE 333 Spirometry 02/03/17 >> FEV1 1.2 (61%), FEV1% 54 IgE 02/04/19 >> 344 IgE 10/18/21 >> 320  Chest Imaging:  CT chest 04/18/14 >> centrilobular emphysema  Cardiac testing:  Echo 08/15/22 >> EF 60 to 65%, grade 1 DD, mod LA dilation, mild/mod MR   Social History:  She  reports that she quit smoking about 43 years ago. Her smoking use included cigarettes. She has a 10.00 pack-year smoking history. She has never used smokeless tobacco. She reports current alcohol use of about 1.0 standard drink of alcohol per week. She reports that she does not use drugs.  Family History:  Her family history is not on file.     Assessment/Plan:   COPD with asthma and eosinophilic phenotype, and chronic bronchitis. - intolerant of xolair in 2010 - started fasenra in September 2019; this is working well in controlling exacerbations and she will continue this - has done better since starting trelegy; continue trelegy 200 one puff daily - continue singulair 10 mg nightly - prn albuterol; she has a nebulizer machine - prn mucinex, flutter valve  Allergic rhinitis. - continue fasenra, singulair, xyzal, flonase  Mitral regurgitation. - will arrange for referral to cardiology  Time Spent Involved in Patient Care on Day of Examination:  28 minutes  Follow up:   Patient Instructions  Will arrange for cardiology referral to assess mitral regurgitation  Follow up in 6 months  Medication List:   Allergies as of 09/05/2022       Reactions   Zoledronic Acid Other (See Comments)   Other reaction(s): Tachycardia, patient provider will decide if she had a true allergy.         Medication List        Accurate as of September 05, 2022  4:03 PM. If you have any questions, ask your nurse or doctor.          STOP taking these  medications    benzonatate 200 MG capsule Commonly known as: TESSALON Stopped by: Chesley Mires, MD   CULTURELLE PO Stopped by: Chesley Mires, MD   escitalopram 5 MG tablet Commonly known as: LEXAPRO Stopped by: Chesley Mires, MD   Magnesium 100 MG Caps Stopped by: Chesley Mires, MD   predniSONE 5 MG tablet Commonly known as: DELTASONE Stopped by: Chesley Mires, MD   pregabalin 75 MG capsule Commonly known as: LYRICA Stopped by: Chesley Mires, MD   theophylline 100 MG 24 hr capsule Commonly known as: Theo-24 Stopped by: Chesley Mires, MD       TAKE these medications    albuterol 0.63 MG/3ML nebulizer solution Commonly known as: ACCUNEB Take 3 mLs (0.63 mg total) by nebulization every 6 (six) hours as needed for wheezing.   albuterol 108 (90 Base) MCG/ACT inhaler Commonly known as: ProAir HFA Inhale 2 puffs into the lungs every 6 (six) hours as needed.   Fasenra 30 MG/ML Sosy Generic drug: Benralizumab Inject 30 mg into the skin every 8 (eight) weeks.   fluticasone 50 MCG/ACT nasal spray Commonly known as: FLONASE Place 2 sprays into both nostrils daily.   guaiFENesin 600 MG 12 hr tablet Commonly known as: MUCINEX Take 1,200 mg by mouth daily.   levocetirizine 5 MG tablet Commonly known as: XYZAL Take 1 tablet (5 mg total) by mouth every evening.   levothyroxine 75 MCG tablet Commonly known as: SYNTHROID Take 1 tablet by mouth daily.   montelukast 10 MG tablet Commonly known as: SINGULAIR Take 1 tablet (10 mg total) by mouth at bedtime.   omeprazole 20 MG capsule Commonly known as: PRILOSEC TAKE 1 CAPSULE BY MOUTH TWICE DAILY 30 MINUTES BEFORE FIRST AND LAST MEAL   simvastatin 10 MG tablet Commonly known as: ZOCOR Take 10 mg by mouth daily.   Trelegy Ellipta 200-62.5-25 MCG/ACT Aepb Generic drug: Fluticasone-Umeclidin-Vilant Inhale 1 puff into the lungs daily in the afternoon.        Signature:  Chesley Mires, MD [place] Pager  - [phone] 09/05/2022, 4:03 PM

## 2022-09-26 ENCOUNTER — Ambulatory Visit: Payer: Medicare PPO | Admitting: Interventional Cardiology

## 2022-09-29 ENCOUNTER — Other Ambulatory Visit (HOSPITAL_COMMUNITY): Payer: Self-pay

## 2022-09-29 MED ORDER — SEMAGLUTIDE-WEIGHT MANAGEMENT 0.25 MG/0.5ML ~~LOC~~ SOAJ
0.2500 mg | SUBCUTANEOUS | 1 refills | Status: DC
  Filled 2022-09-29 (×2): qty 2, 28d supply, fill #0
  Filled 2022-10-28: qty 2, 28d supply, fill #1

## 2022-10-25 ENCOUNTER — Ambulatory Visit: Payer: Medicare PPO | Attending: Interventional Cardiology | Admitting: Interventional Cardiology

## 2022-10-25 ENCOUNTER — Encounter: Payer: Self-pay | Admitting: Interventional Cardiology

## 2022-10-25 VITALS — BP 134/80 | HR 73 | Ht 64.0 in | Wt 179.2 lb

## 2022-10-25 DIAGNOSIS — D649 Anemia, unspecified: Secondary | ICD-10-CM | POA: Diagnosis not present

## 2022-10-25 DIAGNOSIS — J4489 Other specified chronic obstructive pulmonary disease: Secondary | ICD-10-CM | POA: Diagnosis not present

## 2022-10-25 DIAGNOSIS — I340 Nonrheumatic mitral (valve) insufficiency: Secondary | ICD-10-CM

## 2022-10-25 DIAGNOSIS — E782 Mixed hyperlipidemia: Secondary | ICD-10-CM | POA: Diagnosis not present

## 2022-10-25 DIAGNOSIS — Z87891 Personal history of nicotine dependence: Secondary | ICD-10-CM

## 2022-10-25 NOTE — Progress Notes (Signed)
Cardiology Office Note   Date:  10/25/2022   ID:  Carlos, Hilt 1950/11/27, MRN [id number]  PCP:  Berneta Levins, DO    No chief complaint on file.  Mitral regurgitation  Wt Readings from Last 3 Encounters:  10/25/22 179 lb 3.2 oz (81.3 kg)  09/05/22 185 lb (83.9 kg)  09/05/22 185 lb 6.4 oz (84.1 kg)       History of Present Illness: Mary [name] is a 72 y.o. female who is being seen today for the evaluation of mitral regurgitation at the request of Chesley Mires, MD.   Had a stress test about 10 years ago at [facility] that was normal.   Had worsening of asthma/COPD in 8/23-9/23.  Ultimately, echo was done.  Echocardiogram in January 2024 showed: "Left ventricular ejection fraction, by estimation, is 60 to 65%. The  left ventricle has normal function. The left ventricle has no regional  wall motion abnormalities. There is mild concentric left ventricular  hypertrophy. Left ventricular diastolic  parameters are consistent with Grade I diastolic dysfunction (impaired  relaxation).   2. Right ventricular systolic function is normal. The right ventricular  size is normal.   3. Left atrial size was moderately dilated.   4. Right atrial size was mildly dilated.   5. The mitral valve is normal in structure. Mild to moderate mitral valve  regurgitation. No evidence of mitral stenosis.   6. The aortic valve is normal in structure. Aortic valve regurgitation is  not visualized. Aortic valve sclerosis/calcification is present, without  any evidence of aortic stenosis.   7. The inferior vena cava is normal in size with greater than 50%  respiratory variability, suggesting right atrial pressure of 3 mmHg. "  Does spin classes and walking several times.  No symptoms that limit exertion.     Past Medical History:  Diagnosis Date   Allergic rhinitis    Chronic obstructive asthma    Colon polyp    Esophageal stricture    Exercise-induced  bronchoconstriction    GERD (gastroesophageal reflux disease)    Hypothyroidism    Osteoporosis    Pneumonia     Past Surgical History:  Procedure Laterality Date   ESOPHAGEAL DILATION     TUBAL LIGATION       Current Outpatient Medications  Medication Sig Dispense Refill   albuterol (ACCUNEB) 0.63 MG/3ML nebulizer solution Take 3 mLs (0.63 mg total) by nebulization every 6 (six) hours as needed for wheezing. 75 mL 12   albuterol (PROAIR HFA) 108 (90 Base) MCG/ACT inhaler Inhale 2 puffs into the lungs every 6 (six) hours as needed. 18 g 3   Benralizumab (FASENRA) 30 MG/ML SOSY Inject 30 mg into the skin every 8 (eight) weeks.     buPROPion (WELLBUTRIN) 75 MG tablet Take 75 mg by mouth 2 (two) times daily.     cholecalciferol 25 MCG (1000 UT) tablet Take 1 tablet by mouth daily.     Cyanocobalamin 1000 MCG TBCR Take 1 tablet by mouth daily.     fluticasone (FLONASE) 50 MCG/ACT nasal spray Place 2 sprays into both nostrils daily. 18.2 mL 3   Fluticasone-Umeclidin-Vilant (TRELEGY ELLIPTA) 200-62.5-25 MCG/ACT AEPB Inhale 1 puff into the lungs daily in the afternoon. 60 each 5   guaiFENesin (MUCINEX) 600 MG 12 hr tablet Take 1,200 mg by mouth daily.      levothyroxine (SYNTHROID, LEVOTHROID) 75 MCG tablet Take 1 tablet by mouth daily.  3   montelukast (SINGULAIR) 10  MG tablet Take 1 tablet (10 mg total) by mouth at bedtime. 90 tablet 3   Semaglutide-Weight Management 0.25 MG/0.5ML SOAJ Inject 0.25 mg into the skin once a week. 2 mL 1   simvastatin (ZOCOR) 40 MG tablet Take 40 mg by mouth daily at 6 PM.     theophylline (THEO-24) 100 MG 24 hr capsule Take by mouth.     levocetirizine (XYZAL) 5 MG tablet Take 1 tablet (5 mg total) by mouth every evening. (Patient not taking: Reported on 10/25/2022) 30 tablet 5   No current facility-administered medications for this visit.    Allergies:   Zoledronic acid    Social History:  The patient  reports that she quit smoking about 43 years ago.  Her smoking use included cigarettes. She has a 10.00 pack-year smoking history. She has never used smokeless tobacco. She reports current alcohol use of about 1.0 standard drink of alcohol per week. She reports that she does not use drugs.   Family History:  The patient's family history includes Cancer in her mother.    ROS:  Please see the history of present illness.   Otherwise, review of systems are positive for DOE.   All other systems are reviewed and negative.    PHYSICAL EXAM: VS:  BP 134/80   Pulse 73   Ht 5\' 4"  (1.626 m)   Wt 179 lb 3.2 oz (81.3 kg)   SpO2 96%   BMI 30.76 kg/m  , BMI Body mass index is 30.76 kg/m. GEN: Well nourished, well developed, in no acute distress HEENT: normal Neck: no JVD, carotid bruits, or masses Cardiac: RRR; no murmurs, rubs, or gallops,no edema  Respiratory:  clear to auscultation bilaterally, normal work of breathing GI: soft, nontender, nondistended, + BS MS: no deformity or atrophy Skin: warm and dry, no rash Neuro:  Strength and sensation are intact Psych: euthymic mood, full affect   EKG:   The ekg ordered today demonstrates NSR, with PVC, no ST changes   Recent Labs: 04/13/2022: Hemoglobin 10.7; Platelets 404.0   Lipid Panel No results found for: "CHOL", "TRIG", "HDL", "CHOLHDL", "VLDL", "LDLCALC", "LDLDIRECT"   Other studies Reviewed: Additional studies/ records that were reviewed today with results demonstrating: echo results reviewed.   ASSESSMENT AND PLAN:  Mitral regurgitation: Mild to moderate by recent echocardiogram in January 2024.  This can be followed with serial ultrasounds every few years.  If there is a change of symptoms, will do ultrasound sooner.   No need for SBE prophylaxis.  Anemia: Colonoscopy every 5 years.  Hemoccult stool to check for blood with her primary care doctor, and check iron panel. Dr. Caleb Popp with [facility].   Hyperlipidemia: Has been on low-dose Zocor.  Needs fasting lipids.  LFTs controlled.   Emphysema/COPD/asthma: Noted on her chest CT from as far back as 2015.  Managed by pulmonary. Former smoker: She quit several years ago   Current medicines are reviewed at length with the patient today.  The patient concerns regarding her medicines were addressed.  The following changes have been made:  No change  Labs/ tests ordered today include:   Orders Placed This Encounter  Procedures   CT CARDIAC SCORING (SELF PAY ONLY)   EKG 12-Lead    Recommend 150 minutes/week of aerobic exercise Low fat, low carb, high fiber diet recommended  Disposition:   FU in 1 year   Signed, Larae Grooms, MD  10/25/2022 3:03 PM    [facility]  [address] [place], [place]  [address] Phone: [phone]; Fax: [phone]

## 2022-10-25 NOTE — Patient Instructions (Signed)
Medication Instructions:  Your physician recommends that you continue on your current medications as directed. Please refer to the Current Medication list given to you today.  *If you need a refill on your cardiac medications before your next appointment, please call your pharmacy*   Lab Work: none If you have labs (blood work) drawn today and your tests are completely normal, you will receive your results only by: [place] (if you have MyChart) OR A paper copy in the mail If you have any lab test that is abnormal or we need to change your treatment, we will call you to review the results.   Testing/Procedures: Dr Irish Lack recommends you have a Calcium Score CT scan   Follow-Up: At [facility], you and your health needs are our priority.  As part of our continuing mission to provide you with exceptional heart care, we have created designated Provider Care Teams.  These Care Teams include your primary Cardiologist (physician) and Advanced Practice Providers (APPs -  Physician Assistants and Nurse Practitioners) who all work together to provide you with the care you need, when you need it.  We recommend signing up for the patient portal called "MyChart".  Sign up information is provided on this After Visit Summary.  MyChart is used to connect with patients for Virtual Visits (Telemedicine).  Patients are able to view lab/test results, encounter notes, upcoming appointments, etc.  Non-urgent messages can be sent to your provider as well.   To learn more about what you can do with MyChart, go to [email or web address].    Your next appointment:   12 month(s)  Provider:   Larae Grooms, MD     Other Instructions

## 2022-10-28 ENCOUNTER — Other Ambulatory Visit (HOSPITAL_COMMUNITY): Payer: Self-pay

## 2022-10-31 ENCOUNTER — Ambulatory Visit (INDEPENDENT_AMBULATORY_CARE_PROVIDER_SITE_OTHER): Payer: Medicare PPO | Admitting: *Deleted

## 2022-10-31 VITALS — BP 129/71 | HR 68 | Temp 97.7°F | Resp 16 | Ht 64.0 in | Wt 183.4 lb

## 2022-10-31 DIAGNOSIS — J4550 Severe persistent asthma, uncomplicated: Secondary | ICD-10-CM | POA: Diagnosis not present

## 2022-10-31 MED ORDER — BENRALIZUMAB 30 MG/ML ~~LOC~~ SOSY
30.0000 mg | PREFILLED_SYRINGE | Freq: Once | SUBCUTANEOUS | Status: AC
  Administered 2022-10-31: 30 mg via SUBCUTANEOUS
  Filled 2022-10-31: qty 1

## 2022-10-31 NOTE — Progress Notes (Signed)
Diagnosis: Asthma  Provider:  Praveen Mannam MD  Procedure: Injection  Fasenra (Benralizumab), Dose: 30 mg, Site: subcutaneous, Number of injections: 1  Post Care: Observation period completed  Discharge: Condition: Good, Destination: Home . AVS Provided and AVS Declined  Performed by:  Suhaila Troiano A, RN       

## 2022-11-23 ENCOUNTER — Other Ambulatory Visit (HOSPITAL_COMMUNITY): Payer: Self-pay

## 2022-11-23 MED ORDER — WEGOVY 0.5 MG/0.5ML ~~LOC~~ SOAJ
0.5000 mg | SUBCUTANEOUS | 1 refills | Status: DC
  Filled 2022-11-23 (×2): qty 2, 28d supply, fill #0
  Filled 2022-12-23: qty 2, 28d supply, fill #1

## 2022-11-29 ENCOUNTER — Encounter (HOSPITAL_BASED_OUTPATIENT_CLINIC_OR_DEPARTMENT_OTHER): Payer: Self-pay

## 2022-11-29 ENCOUNTER — Ambulatory Visit (HOSPITAL_BASED_OUTPATIENT_CLINIC_OR_DEPARTMENT_OTHER)
Admission: RE | Admit: 2022-11-29 | Discharge: 2022-11-29 | Disposition: A | Discharge status: Home / Self Care | Payer: Medicare PPO | Source: Ambulatory Visit | Attending: Interventional Cardiology | Admitting: Interventional Cardiology

## 2022-11-29 DIAGNOSIS — I340 Nonrheumatic mitral (valve) insufficiency: Secondary | ICD-10-CM | POA: Insufficient documentation

## 2022-11-29 DIAGNOSIS — E782 Mixed hyperlipidemia: Secondary | ICD-10-CM | POA: Insufficient documentation

## 2022-11-30 DIAGNOSIS — M65341 Trigger finger, right ring finger: Secondary | ICD-10-CM | POA: Insufficient documentation

## 2022-12-12 ENCOUNTER — Other Ambulatory Visit (HOSPITAL_COMMUNITY): Payer: Self-pay

## 2022-12-12 ENCOUNTER — Telehealth: Payer: Self-pay | Admitting: Pulmonary Disease

## 2022-12-14 ENCOUNTER — Other Ambulatory Visit: Payer: Self-pay

## 2022-12-14 MED ORDER — TRELEGY ELLIPTA 200-62.5-25 MCG/ACT IN AEPB: 1.0000 | INHALATION_SPRAY | Freq: Every day | RESPIRATORY_TRACT | 5 refills | Status: DC

## 2022-12-14 NOTE — Telephone Encounter (Signed)
Spoke with patient. Confirmed pharmacy. Refill for Trelegy has been sent. NFN

## 2022-12-14 NOTE — Telephone Encounter (Signed)
Pt needs a refill for her Trelegy

## 2022-12-27 ENCOUNTER — Ambulatory Visit (INDEPENDENT_AMBULATORY_CARE_PROVIDER_SITE_OTHER): Payer: Medicare PPO

## 2022-12-27 VITALS — BP 117/71 | HR 75 | Temp 97.6°F | Resp 16 | Ht 64.0 in | Wt 176.0 lb

## 2022-12-27 DIAGNOSIS — J4550 Severe persistent asthma, uncomplicated: Secondary | ICD-10-CM

## 2022-12-27 MED ORDER — BENRALIZUMAB 30 MG/ML ~~LOC~~ SOSY
30.0000 mg | PREFILLED_SYRINGE | Freq: Once | SUBCUTANEOUS | Status: AC
  Administered 2022-12-27: 30 mg via SUBCUTANEOUS
  Filled 2022-12-27: qty 1

## 2022-12-27 NOTE — Progress Notes (Signed)
Diagnosis: Asthma  Provider:  Chilton Greathouse MD  Procedure: Injection  Fasenra (Benralizumab), Dose: 30 mg, Site: subcutaneous, Number of injections: 1  Post Care:  Patient Discharged  Discharge: Condition: Good, Destination: Home . AVS Declined  Performed by:  Marilynn Rail, RN

## 2022-12-30 ENCOUNTER — Telehealth: Payer: Self-pay | Admitting: Podiatry

## 2022-12-30 NOTE — Telephone Encounter (Signed)
Patient called with a couple questions:  1)  All the toe socks she is finding online are too thick for her.  What can we recommend?               ---- I pulled up [employer] with her on the phone and advised her to get the Injinji womens toe sock LINER version, as they are thin and soft.  She'll do that.  2)  Due to her pain in the 2nd toe, she was originally thinking she wants to push out surgery until after football season, but may need to do it sooner.  She mentioned her bunion may need fixed to.  Very briefly discussed 4-6 week recovery with minimal WB p/o, and she'll consider it.  Just an FYI for her chart.

## 2023-01-11 DIAGNOSIS — M79673 Pain in unspecified foot: Secondary | ICD-10-CM | POA: Insufficient documentation

## 2023-01-17 ENCOUNTER — Other Ambulatory Visit (HOSPITAL_COMMUNITY): Payer: Self-pay

## 2023-01-17 MED ORDER — WEGOVY 1 MG/0.5ML ~~LOC~~ SOAJ
1.0000 mg | SUBCUTANEOUS | 1 refills | Status: DC
  Filled 2023-01-17: qty 2, 28d supply, fill #0
  Filled 2023-02-20: qty 2, 28d supply, fill #1

## 2023-02-15 DIAGNOSIS — M65341 Trigger finger, right ring finger: Secondary | ICD-10-CM | POA: Insufficient documentation

## 2023-02-15 DIAGNOSIS — M79645 Pain in left finger(s): Secondary | ICD-10-CM | POA: Insufficient documentation

## 2023-02-15 DIAGNOSIS — M79641 Pain in right hand: Secondary | ICD-10-CM | POA: Insufficient documentation

## 2023-02-20 ENCOUNTER — Other Ambulatory Visit (HOSPITAL_COMMUNITY): Payer: Self-pay

## 2023-02-21 ENCOUNTER — Other Ambulatory Visit: Payer: Self-pay | Admitting: Geriatric Medicine

## 2023-02-21 DIAGNOSIS — Z1231 Encounter for screening mammogram for malignant neoplasm of breast: Secondary | ICD-10-CM

## 2023-02-22 ENCOUNTER — Ambulatory Visit (INDEPENDENT_AMBULATORY_CARE_PROVIDER_SITE_OTHER): Payer: Medicare PPO

## 2023-02-22 VITALS — BP 117/72 | HR 71 | Temp 97.9°F | Resp 16 | Ht 64.0 in | Wt 173.0 lb

## 2023-02-22 DIAGNOSIS — J4550 Severe persistent asthma, uncomplicated: Secondary | ICD-10-CM

## 2023-02-22 MED ORDER — BENRALIZUMAB 30 MG/ML ~~LOC~~ SOSY
30.0000 mg | PREFILLED_SYRINGE | Freq: Once | SUBCUTANEOUS | Status: AC
  Administered 2023-02-22: 30 mg via SUBCUTANEOUS
  Filled 2023-02-22: qty 1

## 2023-02-22 NOTE — Progress Notes (Signed)
Diagnosis: Asthma  Provider:  Chilton Greathouse MD  Procedure: Injection  Fasenra (Benralizumab), Dose: 30 mg, Site: subcutaneous, Number of injections: 1  Administered in left arm.  Post Care: Patient declined observation  Discharge: Condition: Stable, Destination: Home . AVS Declined  Performed by:  Wyvonne Lenz, RN

## 2023-03-14 ENCOUNTER — Other Ambulatory Visit (HOSPITAL_COMMUNITY): Payer: Self-pay

## 2023-03-14 MED ORDER — WEGOVY 1.7 MG/0.75ML ~~LOC~~ SOAJ
1.7000 mg | SUBCUTANEOUS | 1 refills | Status: DC
  Filled 2023-03-14: qty 3, 28d supply, fill #0
  Filled 2023-04-18: qty 3, 28d supply, fill #1

## 2023-03-28 ENCOUNTER — Ambulatory Visit
Admission: RE | Admit: 2023-03-28 | Discharge: 2023-03-28 | Disposition: A | Discharge status: Home / Self Care | Payer: Medicare PPO | Source: Ambulatory Visit | Attending: Geriatric Medicine | Admitting: Geriatric Medicine

## 2023-03-28 DIAGNOSIS — Z1231 Encounter for screening mammogram for malignant neoplasm of breast: Secondary | ICD-10-CM

## 2023-04-04 ENCOUNTER — Ambulatory Visit (HOSPITAL_BASED_OUTPATIENT_CLINIC_OR_DEPARTMENT_OTHER): Payer: Medicare PPO | Admitting: Pulmonary Disease

## 2023-04-04 ENCOUNTER — Encounter (HOSPITAL_BASED_OUTPATIENT_CLINIC_OR_DEPARTMENT_OTHER): Payer: Self-pay | Admitting: Pulmonary Disease

## 2023-04-04 VITALS — BP 108/68 | HR 78 | Resp 16 | Ht 64.0 in | Wt 169.4 lb

## 2023-04-04 DIAGNOSIS — J3089 Other allergic rhinitis: Secondary | ICD-10-CM

## 2023-04-04 DIAGNOSIS — J4489 Other specified chronic obstructive pulmonary disease: Secondary | ICD-10-CM

## 2023-04-04 DIAGNOSIS — J8283 Eosinophilic asthma: Secondary | ICD-10-CM | POA: Diagnosis not present

## 2023-04-04 NOTE — Patient Instructions (Signed)
Follow up in 6 months 

## 2023-04-04 NOTE — Progress Notes (Signed)
[facility]  Chief Complaint  Patient presents with   Follow-up    Doing ok. Occasionally has some difficulty breathing. Started using her rescue inhaler more often. Thinks it the phlegm from COPD causing her to be hoarse.     Constitutional:  BP 108/68   Pulse 78   Resp 16   Ht 5\' 4"  (1.626 m)   Wt 169 lb 6.4 oz (76.8 kg)   SpO2 98%   BMI 29.08 kg/m   Past Medical History:  PNA, Osteoporosis, Hypothyroidism, GERD, Esophageal stricture, Colon polyp, Nephrolithiasis, b/l hydronephrosis  Past Surgical History:  She  has a past surgical history that includes Esophageal dilation and Tubal ligation.  Brief Summary:  Mary [name] is a 72 y.o. female with former smoker with COPD/asthma with eosinophilic phenotype, allergic rhinitis, and exercise induced bronchoconstriction.      Subjective:   Breathing okay.  Not too much trouble during the Summer.  Uses albuterol intermittently and this helps.  Gets more hoarse if she doesn't use mucinex.  This helps with sinus congestion.  Physical Exam:   Appearance - well kempt   ENMT - no sinus tenderness, no oral exudate, no LAN, Mallampati 2 airway, no stridor  Respiratory - equal breath sounds bilaterally, no wheezing or rales  CV - s1s2 regular rate and rhythm, no murmurs  Ext - no clubbing, no edema  Skin - no rashes  Psych - normal mood and affect      Pulmonary testing:  RAST 02/28/08 >> IgE 327.6, multiple allergens PFT 02/28/08 >> FEV1 1.44(58%), FEV1% 48, TLC 5.25(99%), DLCO 76%   PFT 02/05/10 >> FEV1 1.44(60%), FEV1% 51, TLC 5.22(99%), DLCO 78%, +BD   Started xolair 08/14/08 >> stopped 09/10 (pt intolerant of medication) Spirometry 09/12/11 >> FEV1 1.28(60%), FEV1% 55 Spirometry 12/04/14 >> FEV1 1.22 (62%), FEV1% 52 RAST 02/03/17 >> dog, cat, dust mite, mold, tree bark, IgE 333 Spirometry 02/03/17 >> FEV1 1.2 (61%), FEV1% 54 IgE 02/04/19 >> 344 IgE 10/18/21 >>  320  Chest Imaging:  CT chest 04/18/14 >> centrilobular emphysema  Cardiac testing:  Echo 08/15/22 >> EF 60 to 65%, grade 1 DD, mod LA dilation, mild/mod MR   Social History:  She  reports that she quit smoking about 43 years ago. Her smoking use included cigarettes. She started smoking about 53 years ago. She has a 10 pack-year smoking history. She has never used smokeless tobacco. She reports current alcohol use of about 1.0 standard drink of alcohol per week. She reports that she does not use drugs.  Family History:  Her family history includes Cancer in her mother.     Assessment/Plan:   COPD with asthma and eosinophilic phenotype, and chronic bronchitis. - intolerant of xolair in 2010 - started fasenra in September 2019; this is working well in controlling exacerbations and she will continue this - continue trelegy 200 one puff daily, singulair 10 mg nightly - prn albuterol; she has a nebulizer machine - prn mucinex, flutter valve  Allergic rhinitis. - continue fasenra, singulair, xyzal, flonase  Time Spent Involved in Patient Care on Day of Examination:  25 minutes  Follow up:   Patient Instructions  Follow up in 6 months  Medication List:   Allergies as of 04/04/2023       Reactions   Zoledronic Acid Other (See Comments)   Other reaction(s): Tachycardia, patient provider will decide if she had a true allergy.  Medication List        Accurate as of April 04, 2023 10:54 AM. If you have any questions, ask your nurse or doctor.          STOP taking these medications    Theo-24 100 MG 24 hr capsule Generic drug: theophylline Stopped by: Coralyn Helling       TAKE these medications    albuterol 0.63 MG/3ML nebulizer solution Commonly known as: ACCUNEB Take 3 mLs (0.63 mg total) by nebulization every 6 (six) hours as needed for wheezing.   albuterol 108 (90 Base) MCG/ACT inhaler Commonly known as: ProAir HFA Inhale 2 puffs into the lungs  every 6 (six) hours as needed.   buPROPion 75 MG tablet Commonly known as: WELLBUTRIN Take 75 mg by mouth 2 (two) times daily.   cholecalciferol 25 MCG (1000 UNIT) tablet Commonly known as: VITAMIN D3 Take 1 tablet by mouth daily.   Cyanocobalamin 1000 MCG Tbcr Take 1 tablet by mouth daily.   Fasenra 30 MG/ML prefilled syringe Generic drug: benralizumab Inject 30 mg into the skin every 8 (eight) weeks.   fluticasone 50 MCG/ACT nasal spray Commonly known as: FLONASE Place 2 sprays into both nostrils daily.   guaiFENesin 600 MG 12 hr tablet Commonly known as: MUCINEX Take 1,200 mg by mouth daily.   levocetirizine 5 MG tablet Commonly known as: XYZAL Take 1 tablet (5 mg total) by mouth every evening.   levothyroxine 75 MCG tablet Commonly known as: SYNTHROID Take 1 tablet by mouth daily.   montelukast 10 MG tablet Commonly known as: SINGULAIR Take 1 tablet (10 mg total) by mouth at bedtime.   simvastatin 40 MG tablet Commonly known as: ZOCOR Take 40 mg by mouth daily at 6 PM.   Trelegy Ellipta 200-62.5-25 MCG/ACT Aepb Generic drug: Fluticasone-Umeclidin-Vilant Inhale 1 puff into the lungs daily in the afternoon.   Wegovy 0.25 MG/0.5ML Soaj Generic drug: Semaglutide-Weight Management Inject 0.25 mg into the skin once a week.   Wegovy 0.5 MG/0.5ML Soaj Generic drug: Semaglutide-Weight Management Inject 0.5 mg into the skin once a week.   Wegovy 1 MG/0.5ML Soaj Generic drug: Semaglutide-Weight Management Inject 1 mg into the skin once a week.   Wegovy 1.7 MG/0.75ML Soaj Generic drug: Semaglutide-Weight Management Inject 1.7 mg into the skin once a week.        Signature:  Coralyn Helling, MD [facility] Pulmonary/Critical Care Pager - [phone] 04/04/2023, 10:54 AM

## 2023-04-10 ENCOUNTER — Encounter: Payer: Self-pay | Admitting: Podiatry

## 2023-04-10 ENCOUNTER — Telehealth: Payer: Self-pay | Admitting: Podiatry

## 2023-04-10 ENCOUNTER — Ambulatory Visit: Payer: Medicare PPO | Admitting: Podiatry

## 2023-04-10 DIAGNOSIS — M21611 Bunion of right foot: Secondary | ICD-10-CM

## 2023-04-10 DIAGNOSIS — M2041 Other hammer toe(s) (acquired), right foot: Secondary | ICD-10-CM | POA: Diagnosis not present

## 2023-04-10 NOTE — Progress Notes (Signed)
  Subjective:  Patient ID: Mary [name], female    DOB: [birth year],   MRN: [id number]  No chief complaint on file.   72 y.o. female presents for follow-up of right second digit hammertoe. Relates she is ready to discuss surgery as the pain has worsened and can no longer wait to fix it. . Denies any other pedal complaints. Denies n/v/f/c.   Past Medical History:  Diagnosis Date   Allergic rhinitis    Chronic obstructive asthma    Colon polyp    Esophageal stricture    Exercise-induced bronchoconstriction    GERD (gastroesophageal reflux disease)    Hypothyroidism    Osteoporosis    Pneumonia     Objective:  Physical Exam: Vascular: DP/PT pulses 2/4 bilateral. CFT <3 seconds. Normal hair growth on digits. No edema.  Skin. No lacerations or abrasions bilateral feet.  Musculoskeletal: MMT 5/5 bilateral lower extremities in DF, PF, Inversion and Eversion. Deceased ROM in DF of ankle joint. Moderate bunion deformity with hammered digits 2-4 and rubbing noted to the dorsum of the right second digit.  Neurological: Sensation intact to light touch.   Assessment:   1. Hammertoe of right foot   2. Bunion, right foot       Plan:  Patient was evaluated and treated and all questions answered. X-rays reviewed and discussed with patient. No acute fractures or dislocations noted. Hammered digits 2-4 with moderate bunion deformity noted.  -X-rays reviewed. -Educated on hammertoes and treatment options  -Discussed padding including toe caps and crest pads.  -Patient has exhausted conservative treatments and now ready to discuss surgery. Discussed options of repairing hammertoe vs bunion as well. Patient would like to proceed with second hammertoe repair.  -Informed surgical risk consent was reviewed and read aloud to the patient.  I reviewed the films.  I have discussed my findings with the patient in great detail.  I have discussed all risks including but not limited to infection,  stiffness, scarring, limp, disability, deformity, damage to blood vessels and nerves, numbness, poor healing, need for braces, arthritis, chronic pain, amputation, death.  All benefits and realistic expectations discussed in great detail.  I have made no promises as to the outcome.  I have provided realistic expectations.  I have offered the patient a 2nd opinion, which they have declined and assured me they preferred to proceed despite the risks. -Plan for surgery 9/24.  -Post-op pain meds and antibiotics will be provided.    Louann Sjogren, DPM

## 2023-04-10 NOTE — Telephone Encounter (Signed)
Approved Tracking #[id number]   Primary diagnosis M20.41 - Other hammer toe(s) (acquired), right foot  Care setting Outpatient  Place of service [facility]  Ordering provider Donnie Mesa [facility] / NPI - [id number]  Performing or attending provider Donnie Mesa DPM / NPI - [id number]  Performing facility or agency [facility] / NPI - [id number]  Dates of service 04/25/2023 - 06/25/2023  Hammertoe, Elmon Else Toe, or Mallet Toe Surgical Treatment Code Status Description [phone] 1 unit approved Correction, hammertoe (eg, interphalangeal fusion, partial or total phalangectomy)

## 2023-04-18 ENCOUNTER — Other Ambulatory Visit (HOSPITAL_COMMUNITY): Payer: Self-pay

## 2023-04-18 ENCOUNTER — Encounter: Payer: Self-pay | Admitting: Pulmonary Disease

## 2023-04-19 DIAGNOSIS — R1013 Epigastric pain: Secondary | ICD-10-CM | POA: Insufficient documentation

## 2023-04-19 MED ORDER — BENRALIZUMAB 30 MG/ML ~~LOC~~ SOSY: 30.0000 mg | PREFILLED_SYRINGE | Freq: Once | SUBCUTANEOUS | Status: DC

## 2023-05-02 ENCOUNTER — Encounter: Payer: Medicare PPO | Admitting: Podiatry

## 2023-05-09 ENCOUNTER — Other Ambulatory Visit: Payer: Self-pay | Admitting: Podiatry

## 2023-05-09 DIAGNOSIS — M2041 Other hammer toe(s) (acquired), right foot: Secondary | ICD-10-CM

## 2023-05-09 MED ORDER — ONDANSETRON HCL 4 MG PO TABS: 4.0000 mg | ORAL_TABLET | Freq: Three times a day (TID) | ORAL | 0 refills | Status: DC | PRN

## 2023-05-09 MED ORDER — CEPHALEXIN 500 MG PO CAPS: 500.0000 mg | ORAL_CAPSULE | Freq: Four times a day (QID) | ORAL | 0 refills | Status: DC

## 2023-05-09 MED ORDER — OXYCODONE-ACETAMINOPHEN 5-325 MG PO TABS
1.0000 | ORAL_TABLET | ORAL | 0 refills | Status: AC | PRN
Start: 2023-05-09 — End: 2023-05-16

## 2023-05-16 ENCOUNTER — Ambulatory Visit (INDEPENDENT_AMBULATORY_CARE_PROVIDER_SITE_OTHER): Payer: Medicare PPO | Admitting: Podiatry

## 2023-05-16 ENCOUNTER — Ambulatory Visit (INDEPENDENT_AMBULATORY_CARE_PROVIDER_SITE_OTHER): Payer: Medicare PPO

## 2023-05-16 ENCOUNTER — Encounter: Payer: Self-pay | Admitting: Podiatry

## 2023-05-16 ENCOUNTER — Ambulatory Visit: Payer: Medicare PPO | Admitting: *Deleted

## 2023-05-16 ENCOUNTER — Encounter: Payer: Medicare PPO | Admitting: Podiatry

## 2023-05-16 VITALS — BP 124/75 | HR 85 | Temp 97.9°F | Resp 16 | Ht 64.0 in | Wt 167.2 lb

## 2023-05-16 DIAGNOSIS — Z9889 Other specified postprocedural states: Secondary | ICD-10-CM

## 2023-05-16 DIAGNOSIS — M2041 Other hammer toe(s) (acquired), right foot: Secondary | ICD-10-CM

## 2023-05-16 DIAGNOSIS — J4550 Severe persistent asthma, uncomplicated: Secondary | ICD-10-CM

## 2023-05-16 MED ORDER — BENRALIZUMAB 30 MG/ML ~~LOC~~ SOSY
30.0000 mg | PREFILLED_SYRINGE | Freq: Once | SUBCUTANEOUS | Status: AC
  Administered 2023-05-16: 30 mg via SUBCUTANEOUS
  Filled 2023-05-16: qty 1

## 2023-05-16 NOTE — Progress Notes (Signed)
Diagnosis: Asthma  Provider:  Chilton Greathouse MD  Procedure: Injection  Fasenra (Benralizumab), Dose: 30 mg, Site: subcutaneous, Number of injections: 1  Post Care: Observation period completed  Discharge: Condition: Good, Destination: Home . AVS Declined  Performed by:  Forrest Moron, RN

## 2023-05-16 NOTE — Progress Notes (Signed)
Subjective:  Patient ID: Mary [name], female    DOB: 05/23/1951,  MRN: [id number]  No chief complaint on file.   DOS: 05/09/23 Procedure: Right second digit hammertoe repair  72 y.o. female returns for POV#1. Relates doing well and managing pain   Review of Systems: Negative except as noted in the HPI. Denies N/V/F/Ch.  Past Medical History:  Diagnosis Date   Allergic rhinitis    Chronic obstructive asthma    Colon polyp    Esophageal stricture    Exercise-induced bronchoconstriction    GERD (gastroesophageal reflux disease)    Hypothyroidism    Osteoporosis    Pneumonia     Current Outpatient Medications:    cephALEXin (KEFLEX) 500 MG capsule, Take 1 capsule (500 mg total) by mouth 4 (four) times daily., Disp: 28 capsule, Rfl: 0   ondansetron (ZOFRAN) 4 MG tablet, Take 1 tablet (4 mg total) by mouth every 8 (eight) hours as needed for nausea or vomiting., Disp: 20 tablet, Rfl: 0   oxyCODONE-acetaminophen (PERCOCET/ROXICET) 5-325 MG tablet, Take 1 tablet by mouth every 4 (four) hours as needed for up to 7 days for severe pain., Disp: 30 tablet, Rfl: 0   albuterol (ACCUNEB) 0.63 MG/3ML nebulizer solution, Take 3 mLs (0.63 mg total) by nebulization every 6 (six) hours as needed for wheezing., Disp: 75 mL, Rfl: 12   albuterol (PROAIR HFA) 108 (90 Base) MCG/ACT inhaler, Inhale 2 puffs into the lungs every 6 (six) hours as needed., Disp: 18 g, Rfl: 3   Benralizumab (FASENRA) 30 MG/ML SOSY, Inject 30 mg into the skin every 8 (eight) weeks., Disp: , Rfl:    buPROPion (WELLBUTRIN) 75 MG tablet, Take 75 mg by mouth 2 (two) times daily., Disp: , Rfl:    cholecalciferol 25 MCG (1000 UT) tablet, Take 1 tablet by mouth daily., Disp: , Rfl:    Cyanocobalamin 1000 MCG TBCR, Take 1 tablet by mouth daily., Disp: , Rfl:    fluticasone (FLONASE) 50 MCG/ACT nasal spray, Place 2 sprays into both nostrils daily., Disp: 18.2 mL, Rfl: 3   Fluticasone-Umeclidin-Vilant (TRELEGY ELLIPTA)  200-62.5-25 MCG/ACT AEPB, Inhale 1 puff into the lungs daily in the afternoon., Disp: 60 each, Rfl: 5   guaiFENesin (MUCINEX) 600 MG 12 hr tablet, Take 1,200 mg by mouth daily. , Disp: , Rfl:    levocetirizine (XYZAL) 5 MG tablet, Take 1 tablet (5 mg total) by mouth every evening., Disp: 30 tablet, Rfl: 5   levothyroxine (SYNTHROID, LEVOTHROID) 75 MCG tablet, Take 1 tablet by mouth daily., Disp: , Rfl: 3   montelukast (SINGULAIR) 10 MG tablet, Take 1 tablet (10 mg total) by mouth at bedtime., Disp: 90 tablet, Rfl: 3   Semaglutide-Weight Management (WEGOVY) 0.5 MG/0.5ML SOAJ, Inject 0.5 mg into the skin once a week., Disp: 2 mL, Rfl: 1   Semaglutide-Weight Management (WEGOVY) 1 MG/0.5ML SOAJ, Inject 1 mg into the skin once a week., Disp: 2 mL, Rfl: 1   Semaglutide-Weight Management (WEGOVY) 1.7 MG/0.75ML SOAJ, Inject 1.7 mg into the skin once a week., Disp: 3 mL, Rfl: 1   Semaglutide-Weight Management 0.25 MG/0.5ML SOAJ, Inject 0.25 mg into the skin once a week., Disp: 2 mL, Rfl: 1   simvastatin (ZOCOR) 40 MG tablet, Take 40 mg by mouth daily at 6 PM., Disp: , Rfl:  No current facility-administered medications for this visit.  Facility-Administered Medications Ordered in Other Visits:    benralizumab (FASENRA) prefilled syringe 30 mg, 30 mg, Subcutaneous, Once, Desma Mcgregor, [facility]  Social History  Tobacco Use  Smoking Status Former   Current packs/day: 0.00   Average packs/day: 1 pack/day for 10.0 years (10.0 ttl pk-yrs)   Types: Cigarettes   Start date: 08/01/1969   Quit date: 08/02/1979   Years since quitting: 43.8  Smokeless Tobacco Never    Allergies  Allergen Reactions   Zoledronic Acid Other (See Comments)    Other reaction(s): Tachycardia, patient provider will decide if she had a true allergy.    Objective:  There were no vitals filed for this visit. There is no height or weight on file to calculate BMI. Constitutional Well developed. Well nourished.  Vascular Foot warm and  well perfused. Capillary refill normal to all digits.   Neurologic Normal speech. Oriented to person, place, and time. Epicritic sensation to light touch grossly present bilaterally.  Dermatologic Skin healing well without signs of infection. Skin edges well coapted without signs of infection.  Orthopedic: Tenderness to palpation noted about the surgical site.   Radiographs: Hardware intact and toe well aligned Assessment:   1. Post-operative state   2. Hammertoe of right foot    Plan:  Patient was evaluated and treated and all questions answered.  S/p foot surgery right -Progressing as expected post-operatively. -WB Status: WBAT in sugical shoe -Sutures: intact. -Medications: n/a -Foot redressed.  Return in 2 weeks for suture removal.   No follow-ups on file.

## 2023-05-17 DIAGNOSIS — Z9889 Other specified postprocedural states: Secondary | ICD-10-CM | POA: Insufficient documentation

## 2023-05-23 ENCOUNTER — Encounter: Payer: Self-pay | Admitting: Podiatry

## 2023-05-23 ENCOUNTER — Ambulatory Visit (INDEPENDENT_AMBULATORY_CARE_PROVIDER_SITE_OTHER): Payer: Medicare PPO | Admitting: Podiatry

## 2023-05-23 DIAGNOSIS — Z9889 Other specified postprocedural states: Secondary | ICD-10-CM

## 2023-05-23 DIAGNOSIS — M2041 Other hammer toe(s) (acquired), right foot: Secondary | ICD-10-CM

## 2023-05-23 NOTE — Progress Notes (Signed)
Subjective:  Patient ID: Mary [name], female    DOB: Jul 31, 1951,  MRN: [id number]  Chief Complaint  Patient presents with   Hammer Toe    Rm#23 POV#2 Patient states doing well no concerns at this time occasional pain at times but overall doing well.    DOS: 05/09/23 Procedure: Right second digit hammertoe repair  72 y.o. female returns for POV#2. Relates doing well and managing pain   Review of Systems: Negative except as noted in the HPI. Denies N/V/F/Ch.  Past Medical History:  Diagnosis Date   Allergic rhinitis    Chronic obstructive asthma    Colon polyp    Esophageal stricture    Exercise-induced bronchoconstriction    GERD (gastroesophageal reflux disease)    Hypothyroidism    Osteoporosis    Pneumonia     Current Outpatient Medications:    cephALEXin (KEFLEX) 500 MG capsule, Take 1 capsule (500 mg total) by mouth 4 (four) times daily., Disp: 28 capsule, Rfl: 0   ondansetron (ZOFRAN) 4 MG tablet, Take 1 tablet (4 mg total) by mouth every 8 (eight) hours as needed for nausea or vomiting., Disp: 20 tablet, Rfl: 0   albuterol (ACCUNEB) 0.63 MG/3ML nebulizer solution, Take 3 mLs (0.63 mg total) by nebulization every 6 (six) hours as needed for wheezing., Disp: 75 mL, Rfl: 12   albuterol (PROAIR HFA) 108 (90 Base) MCG/ACT inhaler, Inhale 2 puffs into the lungs every 6 (six) hours as needed., Disp: 18 g, Rfl: 3   Benralizumab (FASENRA) 30 MG/ML SOSY, Inject 30 mg into the skin every 8 (eight) weeks., Disp: , Rfl:    buPROPion (WELLBUTRIN) 75 MG tablet, Take 75 mg by mouth 2 (two) times daily., Disp: , Rfl:    cholecalciferol 25 MCG (1000 UT) tablet, Take 1 tablet by mouth daily., Disp: , Rfl:    Cyanocobalamin 1000 MCG TBCR, Take 1 tablet by mouth daily., Disp: , Rfl:    fluticasone (FLONASE) 50 MCG/ACT nasal spray, Place 2 sprays into both nostrils daily., Disp: 18.2 mL, Rfl: 3   Fluticasone-Umeclidin-Vilant (TRELEGY ELLIPTA) 200-62.5-25 MCG/ACT AEPB, Inhale 1 puff  into the lungs daily in the afternoon., Disp: 60 each, Rfl: 5   guaiFENesin (MUCINEX) 600 MG 12 hr tablet, Take 1,200 mg by mouth daily. , Disp: , Rfl:    levocetirizine (XYZAL) 5 MG tablet, Take 1 tablet (5 mg total) by mouth every evening., Disp: 30 tablet, Rfl: 5   levothyroxine (SYNTHROID, LEVOTHROID) 75 MCG tablet, Take 1 tablet by mouth daily., Disp: , Rfl: 3   montelukast (SINGULAIR) 10 MG tablet, Take 1 tablet (10 mg total) by mouth at bedtime., Disp: 90 tablet, Rfl: 3   Semaglutide-Weight Management (WEGOVY) 0.5 MG/0.5ML SOAJ, Inject 0.5 mg into the skin once a week., Disp: 2 mL, Rfl: 1   Semaglutide-Weight Management (WEGOVY) 1 MG/0.5ML SOAJ, Inject 1 mg into the skin once a week., Disp: 2 mL, Rfl: 1   Semaglutide-Weight Management (WEGOVY) 1.7 MG/0.75ML SOAJ, Inject 1.7 mg into the skin once a week., Disp: 3 mL, Rfl: 1   Semaglutide-Weight Management 0.25 MG/0.5ML SOAJ, Inject 0.25 mg into the skin once a week., Disp: 2 mL, Rfl: 1   simvastatin (ZOCOR) 40 MG tablet, Take 40 mg by mouth daily at 6 PM., Disp: , Rfl:  No current facility-administered medications for this visit.  Facility-Administered Medications Ordered in Other Visits:    benralizumab (FASENRA) prefilled syringe 30 mg, 30 mg, Subcutaneous, Once, Desma Mcgregor, [facility]  Social History   Tobacco  Use  Smoking Status Former   Current packs/day: 0.00   Average packs/day: 1 pack/day for 10.0 years (10.0 ttl pk-yrs)   Types: Cigarettes   Start date: 08/01/1969   Quit date: 08/02/1979   Years since quitting: 43.8  Smokeless Tobacco Never    Allergies  Allergen Reactions   Zoledronic Acid Other (See Comments)    Other reaction(s): Tachycardia, patient provider will decide if she had a true allergy.    Objective:  There were no vitals filed for this visit. There is no height or weight on file to calculate BMI. Constitutional Well developed. Well nourished.  Vascular Foot warm and well perfused. Capillary refill normal to  all digits.   Neurologic Normal speech. Oriented to person, place, and time. Epicritic sensation to light touch grossly present bilaterally.  Dermatologic Skin healing well without signs of infection. Skin edges well coapted without signs of infection.  Orthopedic: Tenderness to palpation noted about the surgical site.   Radiographs: Hardware intact and toe well aligned Assessment:   1. Post-operative state   2. Hammertoe of right foot    Plan:  Patient was evaluated and treated and all questions answered.  S/p foot surgery right -Progressing as expected post-operatively. -WB Status: WBAT in sugical shoe -Sutures: removed today without incident. b -Medications: n/a -Foot redressed.  Return in 2 weeks for pin removal.   No follow-ups on file.

## 2023-05-24 DIAGNOSIS — R4189 Other symptoms and signs involving cognitive functions and awareness: Secondary | ICD-10-CM | POA: Insufficient documentation

## 2023-05-25 ENCOUNTER — Other Ambulatory Visit (HOSPITAL_COMMUNITY): Payer: Self-pay

## 2023-05-25 MED ORDER — WEGOVY 1.7 MG/0.75ML ~~LOC~~ SOAJ
1.7000 mg | SUBCUTANEOUS | 1 refills | Status: DC
  Filled 2023-05-25 – 2023-06-16 (×2): qty 3, 28d supply, fill #0

## 2023-06-06 ENCOUNTER — Ambulatory Visit: Payer: Medicare PPO | Admitting: Speech Pathology

## 2023-06-06 ENCOUNTER — Encounter: Payer: Medicare PPO | Admitting: Podiatry

## 2023-06-06 ENCOUNTER — Telehealth: Payer: Self-pay

## 2023-06-06 NOTE — Telephone Encounter (Signed)
Pharmacy is calling on behalf of patient. Patient is requesting the her prescription of trelegy be refilled 3 inhalers at a time. A new prescription is needed for this.

## 2023-06-07 ENCOUNTER — Encounter: Payer: Self-pay | Admitting: Speech Pathology

## 2023-06-07 ENCOUNTER — Ambulatory Visit: Payer: Medicare PPO | Attending: Geriatric Medicine | Admitting: Speech Pathology

## 2023-06-07 DIAGNOSIS — R41841 Cognitive communication deficit: Secondary | ICD-10-CM | POA: Diagnosis present

## 2023-06-07 DIAGNOSIS — R4782 Fluency disorder in conditions classified elsewhere: Secondary | ICD-10-CM | POA: Insufficient documentation

## 2023-06-07 NOTE — Telephone Encounter (Signed)
Patient is calling. She needs three of the Trelegy inhalers to be written as her prescription.   Pharmacy [facility]

## 2023-06-07 NOTE — Therapy (Signed)
OUTPATIENT SPEECH LANGUAGE PATHOLOGY EVALUATION   Patient Name: Mary [name] MRN: [id number] DOB:February 22, 1951, 72 y.o., female Today's Date: 06/07/2023  PCP: Theodis Shove, DO  REFERRING PROVIDER: Theodis Shove, DO  END OF SESSION:  End of Session - 06/07/23 0937     Visit Number 1    Number of Visits 6    Date for SLP Re-Evaluation 07/12/23    SLP Start Time 0930    SLP Stop Time  1015    SLP Time Calculation (min) 45 min    Activity Tolerance Patient tolerated treatment well             Past Medical History:  Diagnosis Date   Allergic rhinitis    Chronic obstructive asthma    Colon polyp    Esophageal stricture    Exercise-induced bronchoconstriction    GERD (gastroesophageal reflux disease)    Hypothyroidism    Osteoporosis    Pneumonia    Past Surgical History:  Procedure Laterality Date   ESOPHAGEAL DILATION     TUBAL LIGATION     Patient Active Problem List   Diagnosis Date Noted   Thrush 10/18/2021   Severe persistent asthma 09/27/2020   Upper airway cough syndrome 04/16/2014   Cancer screening 04/16/2014   Exercise-induced bronchoconstriction    Esophageal stricture    Colon polyp    Hypothyroidism    Atypical chest pain 04/10/2013   COPD with asthma (HCC) 01/30/2008   Allergic rhinitis 05/04/2007    ONSET DATE: Referred on 06/01/23   REFERRING DIAG:  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 Did not know she had been diagnosed with mild cognitive impairment.   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 Deals with chronic fatigue.   Pt accompanied by: self  PERTINENT HISTORY: Recent anesthesia for foot surgery, no other significant events per chart review  PAIN:  Are you having pain? No  FALLS: Has patient fallen in last 6 months?  No  LIVING ENVIRONMENT: Lives with: lives alone Lives in: House/apartment  PLOF:  Level of assistance: Independent with ADLs, Independent with IADLs Employment: Retired; Programmer, systems - [employer]   PATIENT GOALS: Speech   OBJECTIVE:  Note: Objective measures were completed at Evaluation unless otherwise noted.  DIAGNOSTIC FINDINGS: None completed  COGNITION: Overall cognitive status: Impaired   Per Chart review: 23/30 on MOCA in Sept 2024 (Prior to Surgery) - Denied repeat MOCA.    MOTOR SPEECH: Overall motor speech: Appears intact Level of impairment: Conversation; reportedly Respiration: thoracic breathing Phonation: hoarse; mild (surgery ~3 weeks ago) Resonance: WFL Articulation: Appears intact Intelligibility: Intelligible Motor planning: Appears intact Effective technique: slow rate (reportedly)  ORAL MOTOR EXAMINATION: Overall status: WFL   Swallowing Reports no difficulty with swallowing  STANDARDIZED ASSESSMENTS: SLUMS: 20/30   PATIENT REPORTED OUTCOME MEASURES (PROM): Communication Effectiveness Survey: 24   TODAY'S TREATMENT:  DATE:       PATIENT EDUCATION: Education details: Cognitive impairment and it's impact on speech and language Person educated: Patient Education method: Explanation Education comprehension: needs further education   GOALS: Goals reviewed with patient? No; after further assessment SLP to update goals.   SHORT TERM GOALS: Target date: 07/12/23  Patient will increase score on communication effectiveness survey.  Baseline: Goal status: INITIAL  2.  Patient will complete more  informal speech assessment for SLP to identify breakdown.  Baseline:  Goal status: INITIAL   ASSESSMENT:  CLINICAL IMPRESSION: Pt is a 72yo female who presents to ST OP for evaluation due to self perceived difficulty with stuttering.  Per chart review, no stuttering was noted by doctor.  Patient reported "stuttering" began after surgery.  Patient reports she leads discussions and has difficulty expressing her words re: what she describes as "word hesitation (word is in there and will not come out)", but when asked, also difficulty finding words. This started around a month ago after surgery, where she was required to be under anesthesia.  Patient reports she has no difficulty with cognition at baseline; however, her physician completed a MoCA which indicated mild cognitive impairment prior to surgery.  SLP completed informal assessment using SLUMS.  Patient scored a 20/30 indicating "dementia".  Patient does not endorse any changes in cognition; however, she does endorse chronic fatigue for several years with no known cause.  SLP explained how cognition/speech/language can be impacted by fatigue, anxiety, depression, anesthesia.  Also explained the importance of following up with PCP and neurologist for imaging to determine possible cause for MCI.  At this time, patient reported she would like to focus on speech strategies vs cognition, as she is not noticing any difficulty at home.  She reports to speech, SLP observed 1 instance of patient "getting stuck", where she was observed to revise a word when describing something to therapist.  No other stuttering was noted.  SLP to complete more informal assessment to determine how best to describe speech characteristics that patient is experiencing .  SLP suspects cognition may be impacting language and thought organization which patient may be observing as "stuttering" on her words. SLP rec skilled ST services to address strategies for speech to increase  confidence in communication interactions.     OBJECTIVE IMPAIRMENTS: include attention, memory, and speech . These impairments are limiting patient from effectively communicating at home and in community. Factors affecting potential to achieve goals and functional outcome are  unknown etiology . Patient will benefit from skilled SLP services to address above impairments and improve overall function.  REHAB POTENTIAL: Fair unsure of etiology  PLAN:  SLP FREQUENCY: 1-2x/week  SLP DURATION: 4 weeks  PLANNED INTERVENTIONS: [phone]- Speech Eval Sound Prod, Artic, Phon, Eval Compre, Express, Environmental controls, Economist, Internal/external aids, Functional tasks, SLP instruction and feedback, Compensatory strategies, and Patient/family education    [employer], CCC-SLP 06/07/2023, 2:06 PM

## 2023-06-07 NOTE — Telephone Encounter (Signed)
Patient is calling back for an update on her medication. Please call when medication is filled. [phone]

## 2023-06-08 ENCOUNTER — Ambulatory Visit: Payer: Medicare PPO | Admitting: Speech Pathology

## 2023-06-08 MED ORDER — TRELEGY ELLIPTA 200-62.5-25 MCG/ACT IN AEPB: 1.0000 | INHALATION_SPRAY | Freq: Every day | RESPIRATORY_TRACT | 1 refills | Status: AC

## 2023-06-08 NOTE — Telephone Encounter (Signed)
Patient is calling again. She is completely out of Trelegy and needs a refill.

## 2023-06-08 NOTE — Telephone Encounter (Signed)
17mo supply of trelegy has been sent to preferred pharmacy. Left message for patient.

## 2023-06-12 ENCOUNTER — Encounter: Payer: Self-pay | Admitting: Podiatry

## 2023-06-12 ENCOUNTER — Ambulatory Visit: Payer: Medicare PPO | Admitting: Speech Pathology

## 2023-06-12 ENCOUNTER — Ambulatory Visit (INDEPENDENT_AMBULATORY_CARE_PROVIDER_SITE_OTHER): Payer: Medicare PPO | Admitting: Podiatry

## 2023-06-12 ENCOUNTER — Ambulatory Visit (INDEPENDENT_AMBULATORY_CARE_PROVIDER_SITE_OTHER): Payer: Medicare PPO

## 2023-06-12 DIAGNOSIS — M2041 Other hammer toe(s) (acquired), right foot: Secondary | ICD-10-CM

## 2023-06-12 DIAGNOSIS — R87619 Unspecified abnormal cytological findings in specimens from cervix uteri: Secondary | ICD-10-CM | POA: Insufficient documentation

## 2023-06-12 DIAGNOSIS — Z9889 Other specified postprocedural states: Secondary | ICD-10-CM

## 2023-06-12 NOTE — Progress Notes (Signed)
Subjective:  Patient ID: Mary [name], female    DOB: 14-Apr-1951,  MRN: [id number]  Chief Complaint  Patient presents with   Routine Post Op    POV # 3 DOS 05/09/23 ---RIGHT SECOND DIGIT HAMMER TOE REPAIR    DOS: 05/09/23 Procedure: Right second digit hammertoe repair  72 y.o. female returns for POV#3. Relates doing well and managing pain   Review of Systems: Negative except as noted in the HPI. Denies N/V/F/Ch.  Past Medical History:  Diagnosis Date   Allergic rhinitis    Chronic obstructive asthma    Colon polyp    Esophageal stricture    Exercise-induced bronchoconstriction    GERD (gastroesophageal reflux disease)    Hypothyroidism    Osteoporosis    Pneumonia     Current Outpatient Medications:    cephALEXin (KEFLEX) 500 MG capsule, Take 1 capsule (500 mg total) by mouth 4 (four) times daily., Disp: 28 capsule, Rfl: 0   ondansetron (ZOFRAN) 4 MG tablet, Take 1 tablet (4 mg total) by mouth every 8 (eight) hours as needed for nausea or vomiting., Disp: 20 tablet, Rfl: 0   albuterol (ACCUNEB) 0.63 MG/3ML nebulizer solution, Take 3 mLs (0.63 mg total) by nebulization every 6 (six) hours as needed for wheezing., Disp: 75 mL, Rfl: 12   albuterol (PROAIR HFA) 108 (90 Base) MCG/ACT inhaler, Inhale 2 puffs into the lungs every 6 (six) hours as needed., Disp: 18 g, Rfl: 3   Benralizumab (FASENRA) 30 MG/ML SOSY, Inject 30 mg into the skin every 8 (eight) weeks., Disp: , Rfl:    buPROPion (WELLBUTRIN) 75 MG tablet, Take 75 mg by mouth 2 (two) times daily., Disp: , Rfl:    cholecalciferol 25 MCG (1000 UT) tablet, Take 1 tablet by mouth daily., Disp: , Rfl:    Cyanocobalamin 1000 MCG TBCR, Take 1 tablet by mouth daily., Disp: , Rfl:    fluticasone (FLONASE) 50 MCG/ACT nasal spray, Place 2 sprays into both nostrils daily., Disp: 18.2 mL, Rfl: 3   Fluticasone-Umeclidin-Vilant (TRELEGY ELLIPTA) 200-62.5-25 MCG/ACT AEPB, Inhale 1 puff into the lungs daily in the afternoon., Disp:  180 each, Rfl: 1   guaiFENesin (MUCINEX) 600 MG 12 hr tablet, Take 1,200 mg by mouth daily. , Disp: , Rfl:    levocetirizine (XYZAL) 5 MG tablet, Take 1 tablet (5 mg total) by mouth every evening., Disp: 30 tablet, Rfl: 5   levothyroxine (SYNTHROID, LEVOTHROID) 75 MCG tablet, Take 1 tablet by mouth daily., Disp: , Rfl: 3   montelukast (SINGULAIR) 10 MG tablet, Take 1 tablet (10 mg total) by mouth at bedtime., Disp: 90 tablet, Rfl: 3   Semaglutide-Weight Management (WEGOVY) 0.5 MG/0.5ML SOAJ, Inject 0.5 mg into the skin once a week., Disp: 2 mL, Rfl: 1   Semaglutide-Weight Management (WEGOVY) 1 MG/0.5ML SOAJ, Inject 1 mg into the skin once a week., Disp: 2 mL, Rfl: 1   Semaglutide-Weight Management (WEGOVY) 1.7 MG/0.75ML SOAJ, Inject 1.7 mg into the skin once a week., Disp: 3 mL, Rfl: 1   Semaglutide-Weight Management 0.25 MG/0.5ML SOAJ, Inject 0.25 mg into the skin once a week., Disp: 2 mL, Rfl: 1   simvastatin (ZOCOR) 40 MG tablet, Take 40 mg by mouth daily at 6 PM., Disp: , Rfl:  No current facility-administered medications for this visit.  Facility-Administered Medications Ordered in Other Visits:    benralizumab (FASENRA) prefilled syringe 30 mg, 30 mg, Subcutaneous, Once, Desma Mcgregor, [facility]  Social History   Tobacco Use  Smoking Status Former  Current packs/day: 0.00   Average packs/day: 1 pack/day for 10.0 years (10.0 ttl pk-yrs)   Types: Cigarettes   Start date: 08/01/1969   Quit date: 08/02/1979   Years since quitting: 43.8  Smokeless Tobacco Never    Allergies  Allergen Reactions   Zoledronic Acid Other (See Comments)    Other reaction(s): Tachycardia, patient provider will decide if she had a true allergy.    Objective:  There were no vitals filed for this visit. There is no height or weight on file to calculate BMI. Constitutional Well developed. Well nourished.  Vascular Foot warm and well perfused. Capillary refill normal to all digits.   Neurologic Normal  speech. Oriented to person, place, and time. Epicritic sensation to light touch grossly present bilaterally.  Dermatologic Skin healing well without signs of infection. Skin edges well coapted without signs of infection.  Orthopedic: Tenderness to palpation noted about the surgical site.   Radiographs: Hardware intact and toe well aligned Assessment:   1. Hammertoe of right foot   2. Status post right foot surgery    Plan:  Patient was evaluated and treated and all questions answered.  S/p foot surgery right -Progressing as expected post-operatively. -WB Status: WBAT in sugical shoe and may transition to regular shoes.  -Sutures: pin removed today without incident.  -Medications: n/a -Foot redressed.  Return in 3 weeks for recheck.    No follow-ups on file.

## 2023-06-16 ENCOUNTER — Other Ambulatory Visit (HOSPITAL_BASED_OUTPATIENT_CLINIC_OR_DEPARTMENT_OTHER): Payer: Self-pay

## 2023-06-16 ENCOUNTER — Other Ambulatory Visit (HOSPITAL_COMMUNITY): Payer: Self-pay

## 2023-06-19 ENCOUNTER — Encounter: Payer: Self-pay | Admitting: Speech Pathology

## 2023-06-19 ENCOUNTER — Ambulatory Visit: Payer: Medicare PPO | Admitting: Speech Pathology

## 2023-06-19 DIAGNOSIS — R4782 Fluency disorder in conditions classified elsewhere: Secondary | ICD-10-CM | POA: Diagnosis not present

## 2023-06-19 NOTE — Therapy (Signed)
OUTPATIENT SPEECH LANGUAGE PATHOLOGY TREATMENT   Patient Name: Mary [name] MRN: [id number] DOB:1951-01-24, 72 y.o., female Today's Date: 06/19/2023  PCP: Theodis Shove, DO  REFERRING PROVIDER: Theodis Shove, DO  END OF SESSION:  End of Session - 06/19/23 0935     Visit Number 2    Number of Visits 6    Date for SLP Re-Evaluation 07/12/23    SLP Start Time 0933    SLP Stop Time  1011    SLP Time Calculation (min) 38 min    Activity Tolerance Patient tolerated treatment well             Past Medical History:  Diagnosis Date   Allergic rhinitis    Chronic obstructive asthma    Colon polyp    Esophageal stricture    Exercise-induced bronchoconstriction    GERD (gastroesophageal reflux disease)    Hypothyroidism    Osteoporosis    Pneumonia    Past Surgical History:  Procedure Laterality Date   ESOPHAGEAL DILATION     TUBAL LIGATION     Patient Active Problem List   Diagnosis Date Noted   Abnormal cervical Papanicolaou smear 06/12/2023   Impaired cognition 05/24/2023   History of surgery 05/17/2023   Epigastric pain 04/19/2023   Acquired trigger finger of right ring finger 02/15/2023   Pain in finger of left hand 02/15/2023   Pain in right hand 02/15/2023   Foot pain 01/11/2023   Trigger finger, right ring finger 11/30/2022   Hypersomnia 07/22/2022   Bilateral hydronephrosis 07/18/2022   Obesity, unspecified 07/18/2022   Cardiomegaly 07/06/2022   Heart failure, unspecified (HCC) 07/06/2022   History of lumbar laminectomy for spinal cord decompression 02/24/2022   S/P lumbar discectomy 01/28/2022   Anemia 11/16/2021   Stuttering 11/16/2021   Thrush 10/18/2021   Moderate major depression, single episode (HCC) 03/25/2021   Recurrent major depressive episodes, mild (HCC) 03/25/2021   Acute right-sided low back pain with right-sided sciatica 03/12/2021   Arthritis 03/10/2021   DDD (degenerative disc disease), lumbar 03/10/2021    Lumbar radiculopathy, right 03/10/2021   Arteriosclerosis of carotid artery 11/18/2020   Peripheral vascular disease (HCC) 11/18/2020   Tricuspid valve regurgitation 11/18/2020   Severe persistent asthma 09/27/2020   Chronic fatigue syndrome 07/09/2020   Cobalamin deficiency 07/09/2020   Prediabetes 07/09/2020   Hardening of the aorta (main artery of the heart) (HCC) 05/01/2020   Hyperlipidemia 05/01/2020   Recurrent major depression (HCC) 05/01/2020   Tremor, unspecified 05/01/2020   Vitamin D deficiency 05/01/2020   History of adenomatous polyp of colon 12/21/2017   Synovitis of left ankle 05/20/2016   Upper airway cough syndrome 04/16/2014   Cancer screening 04/16/2014   Esophageal stricture    Colon polyp    Hypothyroidism    Atypical chest pain 04/10/2013   COPD with asthma (HCC) 01/30/2008   Allergic rhinitis 05/04/2007    ONSET DATE: Referred on 06/01/23   REFERRING DIAG:  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   Pt accompanied by: self  PERTINENT HISTORY: Recent anesthesia for foot surgery, no other significant events per chart review  PAIN:  Are you having pain? No    OBJECTIVE:  Note: Objective  measures were completed at Evaluation unless otherwise noted.  DIAGNOSTIC FINDINGS: None completed  COGNITION: Overall cognitive status: Impaired   Per Chart review: 23/30 on MOCA in Sept 2024 (Prior to Surgery) - Denied repeat MOCA.    MOTOR SPEECH: Overall motor speech: Appears intact Level of impairment: Conversation; reportedly Respiration: thoracic breathing Phonation: hoarse; mild (surgery ~3 weeks ago) Resonance:  WFL Articulation: Appears intact Intelligibility: Intelligible Motor planning: Appears intact Effective technique: slow rate (reportedly)  ORAL MOTOR EXAMINATION: Overall status: WFL   Swallowing Reports no difficulty with swallowing  STANDARDIZED ASSESSMENTS: SLUMS: 20/30   PATIENT REPORTED OUTCOME MEASURES (PROM): Communication Effectiveness Survey: 24   TODAY'S TREATMENT:                                                                                                                                         DATE:   06/19/23: Pt reports she has not noticed any changes in cognition or speech since she was last here on 06/07/23. SLP completed informal speech assessment to identify any notable differences is fluency. No impairment was observed in re: rote speech tasks, conversational speech tasks, or story retell task. Pt demonstrated appropriate, occasional interjections (I.e. um, uh). Pt is unable to identify if it is her getting stuck on a single word, or if it is related to getting stuck on the "idea". Pt to reflect after Monday and Friday meetings. SLP asked prompting questions regarding any anxiety/nervousness/stress associated with speaking in front of a group. Pt reports she is more concerned and hyperfocused on speech in front of others because she doesn't want to come across like "she is losing it". Based off pt report, SLP suspects her reported stuttering" is related to difficulty with thought organization. SLP suggested the following strategies to trial at home re:   Doing the prep work Listing ideas/sentences   Pausing with a purpose   Throw it out to the group to reduce pressure on speech  If STUCK/ in a disfluent moment -  STOP, BREATH, RESTART  SLP provided the following HEP re:  Write down your thoughts after each meeting about:  How your speech sounded How many times you got stuck Was it thoughts or on an actual word? What strategies you used What strategies  worked    PATIENT EDUCATION: Education details: Cognitive impairment and it's impact on speech and language Person educated: Patient Education method: Explanation Education comprehension: needs further education   GOALS: Goals reviewed with patient? No  SHORT TERM GOALS: Target date: 07/12/23  Patient will increase score on communication effectiveness survey.  Baseline: Goal status: INITIAL  2.  Patient will complete more informal speech assessment for SLP to identify breakdown.  Baseline:  Goal status: MET 3. Patient will report successful use of thought organization strategies to reduce disfluencies during conversation.  Baseline:  Goal status: INITIAL   ASSESSMENT:  CLINICAL IMPRESSION: Pt is a 72yo  female who presents to ST OP for evaluation due to self perceived difficulty with stuttering.  See tx note. SLP rec skilled ST services to address strategies for speech to increase confidence in communication interactions.     OBJECTIVE IMPAIRMENTS: include attention, memory, and speech . These impairments are limiting patient from effectively communicating at home and in community. Factors affecting potential to achieve goals and functional outcome are  unknown etiology . Patient will benefit from skilled SLP services to address above impairments and improve overall function.  REHAB POTENTIAL: Fair unsure of etiology  PLAN:  SLP FREQUENCY: 1-2x/week  SLP DURATION: 4 weeks  PLANNED INTERVENTIONS: [phone]- Speech Eval Sound Prod, Artic, Phon, Eval Compre, Express, Environmental controls, Economist, Internal/external aids, Functional tasks, SLP instruction and feedback, Compensatory strategies, and Patient/family education    [employer], CCC-SLP 06/19/2023, 9:35 AM

## 2023-06-26 ENCOUNTER — Ambulatory Visit: Payer: Medicare PPO | Admitting: Speech Pathology

## 2023-06-26 ENCOUNTER — Encounter: Payer: Self-pay | Admitting: Speech Pathology

## 2023-06-26 DIAGNOSIS — R4782 Fluency disorder in conditions classified elsewhere: Secondary | ICD-10-CM | POA: Diagnosis not present

## 2023-06-26 NOTE — Therapy (Unsigned)
OUTPATIENT SPEECH LANGUAGE PATHOLOGY TREATMENT   Patient Name: Mary [name] MRN: [id number] DOB:1951/07/29, 72 y.o., female Today's Date: 06/26/2023  PCP: Theodis Shove, DO  REFERRING PROVIDER: Theodis Shove, DO  END OF SESSION:  End of Session - 06/26/23 1107     Visit Number 3    Number of Visits 6    Date for SLP Re-Evaluation 07/12/23    SLP Start Time 1105    SLP Stop Time  1145    SLP Time Calculation (min) 40 min    Activity Tolerance Patient tolerated treatment well             Past Medical History:  Diagnosis Date   Allergic rhinitis    Chronic obstructive asthma    Colon polyp    Esophageal stricture    Exercise-induced bronchoconstriction    GERD (gastroesophageal reflux disease)    Hypothyroidism    Osteoporosis    Pneumonia    Past Surgical History:  Procedure Laterality Date   ESOPHAGEAL DILATION     TUBAL LIGATION     Patient Active Problem List   Diagnosis Date Noted   Abnormal cervical Papanicolaou smear 06/12/2023   Impaired cognition 05/24/2023   History of surgery 05/17/2023   Epigastric pain 04/19/2023   Acquired trigger finger of right ring finger 02/15/2023   Pain in finger of left hand 02/15/2023   Pain in right hand 02/15/2023   Foot pain 01/11/2023   Trigger finger, right ring finger 11/30/2022   Hypersomnia 07/22/2022   Bilateral hydronephrosis 07/18/2022   Obesity, unspecified 07/18/2022   Cardiomegaly 07/06/2022   Heart failure, unspecified (HCC) 07/06/2022   History of lumbar laminectomy for spinal cord decompression 02/24/2022   S/P lumbar discectomy 01/28/2022   Anemia 11/16/2021   Stuttering 11/16/2021   Thrush 10/18/2021   Moderate major depression, single episode (HCC) 03/25/2021   Recurrent major depressive episodes, mild (HCC) 03/25/2021   Acute right-sided low back pain with right-sided sciatica 03/12/2021   Arthritis 03/10/2021   DDD (degenerative disc disease), lumbar 03/10/2021    Lumbar radiculopathy, right 03/10/2021   Arteriosclerosis of carotid artery 11/18/2020   Peripheral vascular disease (HCC) 11/18/2020   Tricuspid valve regurgitation 11/18/2020   Severe persistent asthma 09/27/2020   Chronic fatigue syndrome 07/09/2020   Cobalamin deficiency 07/09/2020   Prediabetes 07/09/2020   Hardening of the aorta (main artery of the heart) (HCC) 05/01/2020   Hyperlipidemia 05/01/2020   Recurrent major depression (HCC) 05/01/2020   Tremor, unspecified 05/01/2020   Vitamin D deficiency 05/01/2020   History of adenomatous polyp of colon 12/21/2017   Synovitis of left ankle 05/20/2016   Upper airway cough syndrome 04/16/2014   Cancer screening 04/16/2014   Esophageal stricture    Colon polyp    Hypothyroidism    Atypical chest pain 04/10/2013   COPD with asthma (HCC) 01/30/2008   Allergic rhinitis 05/04/2007    ONSET DATE: Referred on 06/01/23   REFERRING DIAG:  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   Pt accompanied by: self  PERTINENT HISTORY: Recent anesthesia for foot surgery, no other significant events per chart review  PAIN:  Are you having pain? No    OBJECTIVE:  Note: Objective  measures were completed at Evaluation unless otherwise noted.  DIAGNOSTIC FINDINGS: None completed  COGNITION: Overall cognitive status: Impaired   Per Chart review: 23/30 on MOCA in Sept 2024 (Prior to Surgery) - Denied repeat MOCA.    MOTOR SPEECH: Overall motor speech: Appears intact Level of impairment: Conversation; reportedly Respiration: thoracic breathing Phonation: hoarse; mild (surgery ~3 weeks ago) Resonance:  WFL Articulation: Appears intact Intelligibility: Intelligible Motor planning: Appears intact Effective technique: slow rate (reportedly)  ORAL MOTOR EXAMINATION: Overall status: WFL   Swallowing Reports no difficulty with swallowing  STANDARDIZED ASSESSMENTS: SLUMS: 20/30   PATIENT REPORTED OUTCOME MEASURES (PROM): Communication Effectiveness Survey: 24   TODAY'S TREATMENT:                                                                                                                                         DATE:   06/26/23:   Difficulty with general explanation during a meeting, where she lost her thought, but the group helped fill it in after she prompted "What was I saying?" Pt reports she did not have any disfluencies this week.   06/19/23: Pt reports she has not noticed any changes in cognition or speech since she was last here on 06/07/23. SLP completed informal speech assessment to identify any notable differences is fluency. No impairment was observed in re: rote speech tasks, conversational speech tasks, or story retell task. Pt demonstrated appropriate, occasional interjections (I.e. um, uh). Pt is unable to identify if it is her getting stuck on a single word, or if it is related to getting stuck on the "idea". Pt to reflect after Monday and Friday meetings. SLP asked prompting questions regarding any anxiety/nervousness/stress associated with speaking in front of a group. Pt reports she is more concerned and hyperfocused on speech in front of others because she doesn't want to come across like "she is losing it". Based off pt report, SLP suspects her reported stuttering" is related to difficulty with thought organization. SLP suggested the following strategies to trial at home re:   Doing the prep work Listing ideas/sentences   Pausing with a purpose   Throw it out to the group to reduce pressure on speech  If STUCK/ in a disfluent moment -  STOP, BREATH,  RESTART  SLP provided the following HEP re:  Write down your thoughts after each meeting about:  How your speech sounded How many times you got stuck Was it thoughts or on an actual word? What strategies you used What strategies worked    PATIENT EDUCATION: Education details: Cognitive impairment and it's impact on speech and language Person educated: Patient Education method: Explanation Education comprehension: needs further education   GOALS: Goals reviewed with patient? No  SHORT TERM GOALS: Target date: 07/12/23  Patient will increase score on communication effectiveness survey.  Baseline: Goal status: INITIAL  2.  Patient will complete more informal speech assessment for  SLP to identify breakdown.  Baseline:  Goal status: MET 3. Patient will report successful use of thought organization strategies to reduce disfluencies during conversation.  Baseline:  Goal status: INITIAL   ASSESSMENT:  CLINICAL IMPRESSION: Pt is a 72yo female who presents to ST OP for evaluation due to self perceived difficulty with stuttering.  See tx note. SLP rec skilled ST services to address strategies for speech to increase confidence in communication interactions.     OBJECTIVE IMPAIRMENTS: include attention, memory, and speech . These impairments are limiting patient from effectively communicating at home and in community. Factors affecting potential to achieve goals and functional outcome are  unknown etiology . Patient will benefit from skilled SLP services to address above impairments and improve overall function.  REHAB POTENTIAL: Fair unsure of etiology  PLAN:  SLP FREQUENCY: 1-2x/week  SLP DURATION: 4 weeks  PLANNED INTERVENTIONS: [phone]- Speech Eval Sound Prod, Artic, Phon, Eval Compre, Express, Environmental controls, Economist, Internal/external aids, Functional tasks, SLP instruction and feedback, Compensatory strategies, and Patient/family education    [employer], CCC-SLP 06/26/2023, 11:08 AM

## 2023-07-03 ENCOUNTER — Ambulatory Visit: Payer: Medicare PPO | Admitting: Speech Pathology

## 2023-07-04 ENCOUNTER — Encounter: Payer: Self-pay | Admitting: Podiatry

## 2023-07-04 ENCOUNTER — Ambulatory Visit (INDEPENDENT_AMBULATORY_CARE_PROVIDER_SITE_OTHER): Payer: Medicare PPO

## 2023-07-04 ENCOUNTER — Ambulatory Visit (INDEPENDENT_AMBULATORY_CARE_PROVIDER_SITE_OTHER): Payer: Medicare PPO | Admitting: Podiatry

## 2023-07-04 DIAGNOSIS — M2041 Other hammer toe(s) (acquired), right foot: Secondary | ICD-10-CM

## 2023-07-04 DIAGNOSIS — Z9889 Other specified postprocedural states: Secondary | ICD-10-CM | POA: Diagnosis not present

## 2023-07-04 NOTE — Progress Notes (Signed)
Subjective:  Patient ID: Mary [name], female    DOB: 29-Sep-1950,  MRN: [id number]  No chief complaint on file.   DOS: 05/09/23 Procedure: Right second digit hammertoe repair  72 y.o. female returns for POV#4. Relates doing well and managing pain. Is getting some sorenss still but better. Has not been able to go back into regular shoes.   Review of Systems: Negative except as noted in the HPI. Denies N/V/F/Ch.  Past Medical History:  Diagnosis Date   Allergic rhinitis    Chronic obstructive asthma    Colon polyp    Esophageal stricture    Exercise-induced bronchoconstriction    GERD (gastroesophageal reflux disease)    Hypothyroidism    Osteoporosis    Pneumonia     Current Outpatient Medications:    ondansetron (ZOFRAN) 4 MG tablet, Take 1 tablet (4 mg total) by mouth every 8 (eight) hours as needed for nausea or vomiting., Disp: 20 tablet, Rfl: 0   albuterol (ACCUNEB) 0.63 MG/3ML nebulizer solution, Take 3 mLs (0.63 mg total) by nebulization every 6 (six) hours as needed for wheezing., Disp: 75 mL, Rfl: 12   albuterol (PROAIR HFA) 108 (90 Base) MCG/ACT inhaler, Inhale 2 puffs into the lungs every 6 (six) hours as needed., Disp: 18 g, Rfl: 3   Benralizumab (FASENRA) 30 MG/ML SOSY, Inject 30 mg into the skin every 8 (eight) weeks., Disp: , Rfl:    buPROPion (WELLBUTRIN) 75 MG tablet, Take 75 mg by mouth 2 (two) times daily., Disp: , Rfl:    cholecalciferol 25 MCG (1000 UT) tablet, Take 1 tablet by mouth daily., Disp: , Rfl:    Cyanocobalamin 1000 MCG TBCR, Take 1 tablet by mouth daily., Disp: , Rfl:    fluticasone (FLONASE) 50 MCG/ACT nasal spray, Place 2 sprays into both nostrils daily., Disp: 18.2 mL, Rfl: 3   Fluticasone-Umeclidin-Vilant (TRELEGY ELLIPTA) 200-62.5-25 MCG/ACT AEPB, Inhale 1 puff into the lungs daily in the afternoon., Disp: 180 each, Rfl: 1   guaiFENesin (MUCINEX) 600 MG 12 hr tablet, Take 1,200 mg by mouth daily. , Disp: , Rfl:    levocetirizine  (XYZAL) 5 MG tablet, Take 1 tablet (5 mg total) by mouth every evening., Disp: 30 tablet, Rfl: 5   levothyroxine (SYNTHROID, LEVOTHROID) 75 MCG tablet, Take 1 tablet by mouth daily., Disp: , Rfl: 3   montelukast (SINGULAIR) 10 MG tablet, Take 1 tablet (10 mg total) by mouth at bedtime., Disp: 90 tablet, Rfl: 3   omeprazole (PRILOSEC) 20 MG capsule, Take 20 mg by mouth 2 (two) times daily., Disp: , Rfl:    Semaglutide-Weight Management (WEGOVY) 0.5 MG/0.5ML SOAJ, Inject 0.5 mg into the skin once a week., Disp: 2 mL, Rfl: 1   Semaglutide-Weight Management (WEGOVY) 1 MG/0.5ML SOAJ, Inject 1 mg into the skin once a week., Disp: 2 mL, Rfl: 1   Semaglutide-Weight Management (WEGOVY) 1.7 MG/0.75ML SOAJ, Inject 1.7 mg into the skin once a week., Disp: 3 mL, Rfl: 1   Semaglutide-Weight Management 0.25 MG/0.5ML SOAJ, Inject 0.25 mg into the skin once a week., Disp: 2 mL, Rfl: 1   simvastatin (ZOCOR) 40 MG tablet, Take 40 mg by mouth daily at 6 PM., Disp: , Rfl:  No current facility-administered medications for this visit.  Facility-Administered Medications Ordered in Other Visits:    benralizumab (FASENRA) prefilled syringe 30 mg, 30 mg, Subcutaneous, Once, Desma Mcgregor, RPH  Social History   Tobacco Use  Smoking Status Former   Current packs/day: 0.00   Average packs/day: 1  pack/day for 10.0 years (10.0 ttl pk-yrs)   Types: Cigarettes   Start date: 08/01/1969   Quit date: 08/02/1979   Years since quitting: 43.9  Smokeless Tobacco Never    Allergies  Allergen Reactions   Zoledronic Acid Other (See Comments)    Other reaction(s): Tachycardia, patient provider will decide if she had a true allergy.    Duloxetine Hcl    Objective:  There were no vitals filed for this visit. There is no height or weight on file to calculate BMI. Constitutional Well developed. Well nourished.  Vascular Foot warm and well perfused. Capillary refill normal to all digits.   Neurologic Normal speech. Oriented to  person, place, and time. Epicritic sensation to light touch grossly present bilaterally.  Dermatologic Skin healing well without signs of infection. Skin edges well coapted without signs of infection.  Orthopedic: Tenderness to palpation noted about the surgical site.   Radiographs: Interval removal of hardware. There is noted elevation of the second digit and reoccurance of the hammered PIPJ.  Assessment:   1. Post-operative state   2. Hammertoe of right foot    Plan:  Patient was evaluated and treated and all questions answered.  S/p foot surgery right -Progressing well as far as pain but toe is starting to return to origianl position.  -WB Status: WBAT in regular shoes -Discussed trying taping for a period of time and see if this helps splint the toe as it continues to heel.   -Medications: n/a  Patient  return in 4 weeks for recheck.   No follow-ups on file.

## 2023-07-10 ENCOUNTER — Encounter: Payer: Medicare PPO | Admitting: Speech Pathology

## 2023-07-11 ENCOUNTER — Ambulatory Visit: Payer: Medicare PPO

## 2023-07-11 VITALS — BP 116/72 | HR 81 | Temp 98.0°F | Resp 18 | Ht 67.0 in | Wt 165.4 lb

## 2023-07-11 DIAGNOSIS — J4550 Severe persistent asthma, uncomplicated: Secondary | ICD-10-CM

## 2023-07-11 MED ORDER — BENRALIZUMAB 30 MG/ML ~~LOC~~ SOSY
30.0000 mg | PREFILLED_SYRINGE | Freq: Once | SUBCUTANEOUS | Status: AC
  Administered 2023-07-11: 30 mg via SUBCUTANEOUS
  Filled 2023-07-11: qty 1

## 2023-07-11 NOTE — Progress Notes (Signed)
Diagnosis: Asthma  Provider:  Chilton Greathouse MD  Procedure: Injection  Fasenra (Benralizumab), Dose: 30 mg, Site: subcutaneous, Number of injections: 1  Injection Site(s): Left arm  Post Care:  left arm injection  Discharge: Condition: Good, Destination: Home . AVS Declined  Performed by:  Rico Ala, LPN

## 2023-07-18 ENCOUNTER — Other Ambulatory Visit (HOSPITAL_BASED_OUTPATIENT_CLINIC_OR_DEPARTMENT_OTHER): Payer: Self-pay

## 2023-07-18 MED ORDER — WEGOVY 2.4 MG/0.75ML ~~LOC~~ SOAJ
2.4000 mg | SUBCUTANEOUS | 1 refills | Status: DC
  Filled 2023-07-18: qty 3, 28d supply, fill #0
  Filled 2023-08-17 (×2): qty 3, 28d supply, fill #1

## 2023-07-31 ENCOUNTER — Ambulatory Visit (INDEPENDENT_AMBULATORY_CARE_PROVIDER_SITE_OTHER): Payer: Medicare PPO | Admitting: Podiatry

## 2023-07-31 ENCOUNTER — Encounter: Payer: Self-pay | Admitting: Podiatry

## 2023-07-31 DIAGNOSIS — Z9889 Other specified postprocedural states: Secondary | ICD-10-CM

## 2023-07-31 DIAGNOSIS — M2041 Other hammer toe(s) (acquired), right foot: Secondary | ICD-10-CM

## 2023-07-31 NOTE — Progress Notes (Signed)
Subjective:  Patient ID: Mary [name], female    DOB: June 30, 1951,  MRN: [id number]  No chief complaint on file.   DOS: 05/09/23 Procedure: Right second digit hammertoe repair  72 y.o. female returns for POV#5. Relates doing well and managing pain. Is getting some sorenss still but better. Has not been able to go back into regular shoes.   Review of Systems: Negative except as noted in the HPI. Denies N/V/F/Ch.  Past Medical History:  Diagnosis Date   Allergic rhinitis    Chronic obstructive asthma    Colon polyp    Esophageal stricture    Exercise-induced bronchoconstriction    GERD (gastroesophageal reflux disease)    Hypothyroidism    Osteoporosis    Pneumonia     Current Outpatient Medications:    ondansetron (ZOFRAN) 4 MG tablet, Take 1 tablet (4 mg total) by mouth every 8 (eight) hours as needed for nausea or vomiting., Disp: 20 tablet, Rfl: 0   albuterol (ACCUNEB) 0.63 MG/3ML nebulizer solution, Take 3 mLs (0.63 mg total) by nebulization every 6 (six) hours as needed for wheezing., Disp: 75 mL, Rfl: 12   albuterol (PROAIR HFA) 108 (90 Base) MCG/ACT inhaler, Inhale 2 puffs into the lungs every 6 (six) hours as needed., Disp: 18 g, Rfl: 3   Benralizumab (FASENRA) 30 MG/ML SOSY, Inject 30 mg into the skin every 8 (eight) weeks., Disp: , Rfl:    buPROPion (WELLBUTRIN) 75 MG tablet, Take 75 mg by mouth 2 (two) times daily., Disp: , Rfl:    cholecalciferol 25 MCG (1000 UT) tablet, Take 1 tablet by mouth daily., Disp: , Rfl:    Cyanocobalamin 1000 MCG TBCR, Take 1 tablet by mouth daily., Disp: , Rfl:    fluticasone (FLONASE) 50 MCG/ACT nasal spray, Place 2 sprays into both nostrils daily., Disp: 18.2 mL, Rfl: 3   Fluticasone-Umeclidin-Vilant (TRELEGY ELLIPTA) 200-62.5-25 MCG/ACT AEPB, Inhale 1 puff into the lungs daily in the afternoon., Disp: 180 each, Rfl: 1   guaiFENesin (MUCINEX) 600 MG 12 hr tablet, Take 1,200 mg by mouth daily. , Disp: , Rfl:    levocetirizine  (XYZAL) 5 MG tablet, Take 1 tablet (5 mg total) by mouth every evening., Disp: 30 tablet, Rfl: 5   levothyroxine (SYNTHROID, LEVOTHROID) 75 MCG tablet, Take 1 tablet by mouth daily., Disp: , Rfl: 3   montelukast (SINGULAIR) 10 MG tablet, Take 1 tablet (10 mg total) by mouth at bedtime., Disp: 90 tablet, Rfl: 3   omeprazole (PRILOSEC) 20 MG capsule, Take 20 mg by mouth 2 (two) times daily., Disp: , Rfl:    Semaglutide-Weight Management (WEGOVY) 0.5 MG/0.5ML SOAJ, Inject 0.5 mg into the skin once a week., Disp: 2 mL, Rfl: 1   Semaglutide-Weight Management (WEGOVY) 1 MG/0.5ML SOAJ, Inject 1 mg into the skin once a week., Disp: 2 mL, Rfl: 1   Semaglutide-Weight Management (WEGOVY) 1.7 MG/0.75ML SOAJ, Inject 1.7 mg into the skin once a week., Disp: 3 mL, Rfl: 1   Semaglutide-Weight Management (WEGOVY) 2.4 MG/0.75ML SOAJ, Inject 2.4 mg into the skin once a week., Disp: 3 mL, Rfl: 1   Semaglutide-Weight Management 0.25 MG/0.5ML SOAJ, Inject 0.25 mg into the skin once a week., Disp: 2 mL, Rfl: 1   simvastatin (ZOCOR) 40 MG tablet, Take 40 mg by mouth daily at 6 PM., Disp: , Rfl:  No current facility-administered medications for this visit.  Facility-Administered Medications Ordered in Other Visits:    benralizumab (FASENRA) prefilled syringe 30 mg, 30 mg, Subcutaneous, Once, Desma Mcgregor,  RPH  Social History   Tobacco Use  Smoking Status Former   Current packs/day: 0.00   Average packs/day: 1 pack/day for 10.0 years (10.0 ttl pk-yrs)   Types: Cigarettes   Start date: 08/01/1969   Quit date: 08/02/1979   Years since quitting: 44.0  Smokeless Tobacco Never    Allergies  Allergen Reactions   Zoledronic Acid Other (See Comments)    Other reaction(s): Tachycardia, patient provider will decide if she had a true allergy.    Duloxetine Hcl    Objective:  There were no vitals filed for this visit. There is no height or weight on file to calculate BMI. Constitutional Well developed. Well nourished.   Vascular Foot warm and well perfused. Capillary refill normal to all digits.   Neurologic Normal speech. Oriented to person, place, and time. Epicritic sensation to light touch grossly present bilaterally.  Dermatologic Skin healing well without signs of infection. Skin edges well coapted without signs of infection.  Orthopedic: Tenderness to palpation noted about the surgical site.   Radiographs: Interval removal of hardware. There is noted elevation of the second digit and reoccurance of the hammered PIPJ.  Assessment:   No diagnosis found.  Plan:  Patient was evaluated and treated and all questions answered.  S/p foot surgery right -Progressing well as far as pain but toe is starting to return to origianl position.  -WB Status: WBAT in regular shoes -Taping has been helping.  Dispensed toe splint to help -Did relate options for recorrection including additional procedure, fixing bunion vs amputation of the toe and patient does not want to undergo anymore.  -Medications: n/a  Patient  return  as needed in future.   No follow-ups on file.

## 2023-08-17 ENCOUNTER — Other Ambulatory Visit (HOSPITAL_BASED_OUTPATIENT_CLINIC_OR_DEPARTMENT_OTHER): Payer: Self-pay

## 2023-08-31 ENCOUNTER — Other Ambulatory Visit (HOSPITAL_BASED_OUTPATIENT_CLINIC_OR_DEPARTMENT_OTHER): Payer: Self-pay

## 2023-08-31 MED ORDER — WEGOVY 2.4 MG/0.75ML ~~LOC~~ SOAJ
2.4000 mg | SUBCUTANEOUS | 2 refills | Status: DC
  Filled 2023-09-11: qty 3, 28d supply, fill #0
  Filled 2023-10-12: qty 3, 28d supply, fill #1
  Filled 2023-11-19: qty 3, 28d supply, fill #2

## 2023-09-05 ENCOUNTER — Telehealth: Payer: Self-pay | Admitting: Pharmacy Technician

## 2023-09-05 ENCOUNTER — Ambulatory Visit: Payer: Medicare PPO

## 2023-09-05 NOTE — Telephone Encounter (Addendum)
 Auth Submission: APPROVED Site of care: Site of care: [facility] Payer: [employer] medicare Medication & CPT/J Code(s) submitted: Fasenra  (Benralizumab ) [id number] Route of submission (phone, fax, portal): portal Phone # Fax # Auth type: Buy/Bill PB Units/visits requested: 30mg  q8wks Reference wlfazm:[id number] Approval from:  09/30/20 - 07/31/24

## 2023-09-11 ENCOUNTER — Other Ambulatory Visit (HOSPITAL_BASED_OUTPATIENT_CLINIC_OR_DEPARTMENT_OTHER): Payer: Self-pay

## 2023-09-11 ENCOUNTER — Other Ambulatory Visit: Payer: Self-pay

## 2023-09-15 ENCOUNTER — Ambulatory Visit: Payer: Medicare PPO

## 2023-09-15 VITALS — BP 136/78 | Temp 98.1°F | Resp 18 | Ht 67.0 in | Wt 166.4 lb

## 2023-09-15 DIAGNOSIS — J4550 Severe persistent asthma, uncomplicated: Secondary | ICD-10-CM

## 2023-09-15 MED ORDER — BENRALIZUMAB 30 MG/ML ~~LOC~~ SOSY
30.0000 mg | PREFILLED_SYRINGE | Freq: Once | SUBCUTANEOUS | Status: AC
  Administered 2023-09-15: 30 mg via SUBCUTANEOUS
  Filled 2023-09-15: qty 1

## 2023-09-15 NOTE — Progress Notes (Signed)
Diagnosis: Asthma  Provider:  Chilton Greathouse MD  Procedure: Injection  Fasenra (Benralizumab), Dose: 30 mg, Site: subcutaneous, Number of injections: 1  Injection Site(s): Right arm  Post Care: Patient declined observation  Discharge: Condition: Good, Destination: Home . AVS Declined  Performed by:  Adriana Mccallum, RN

## 2023-10-12 ENCOUNTER — Other Ambulatory Visit (HOSPITAL_BASED_OUTPATIENT_CLINIC_OR_DEPARTMENT_OTHER): Payer: Self-pay

## 2023-10-24 ENCOUNTER — Encounter: Payer: Self-pay | Admitting: Pulmonary Disease

## 2023-11-10 ENCOUNTER — Ambulatory Visit: Payer: Medicare PPO

## 2023-11-20 ENCOUNTER — Other Ambulatory Visit (HOSPITAL_BASED_OUTPATIENT_CLINIC_OR_DEPARTMENT_OTHER): Payer: Self-pay

## 2023-11-24 ENCOUNTER — Other Ambulatory Visit (HOSPITAL_BASED_OUTPATIENT_CLINIC_OR_DEPARTMENT_OTHER): Payer: Self-pay

## 2023-11-28 ENCOUNTER — Ambulatory Visit (INDEPENDENT_AMBULATORY_CARE_PROVIDER_SITE_OTHER)

## 2023-11-28 VITALS — BP 110/70 | HR 94 | Temp 99.0°F | Resp 20 | Ht 64.0 in | Wt 164.6 lb

## 2023-11-28 DIAGNOSIS — J4550 Severe persistent asthma, uncomplicated: Secondary | ICD-10-CM | POA: Diagnosis not present

## 2023-11-28 MED ORDER — BENRALIZUMAB 30 MG/ML ~~LOC~~ SOSY
30.0000 mg | PREFILLED_SYRINGE | Freq: Once | SUBCUTANEOUS | Status: AC
  Administered 2023-11-28: 30 mg via SUBCUTANEOUS

## 2023-11-28 NOTE — Progress Notes (Signed)
 Diagnosis: Asthma  Provider:  Mannam, Praveen MD  Procedure: Injection  Fasenra  (Benralizumab ), Dose: 30 mg, Site: subcutaneous, Number of injections: 1  Injection Site(s): Left arm  Post Care: Patient declined observation  Discharge: Condition: Stable, Destination: Home . AVS Declined  Performed by:  Lendel Quant, RN

## 2024-01-23 ENCOUNTER — Ambulatory Visit

## 2024-01-26 ENCOUNTER — Ambulatory Visit (INDEPENDENT_AMBULATORY_CARE_PROVIDER_SITE_OTHER): Admitting: *Deleted

## 2024-01-26 ENCOUNTER — Encounter: Payer: Self-pay | Admitting: Pulmonary Disease

## 2024-01-26 VITALS — BP 110/68 | HR 69 | Temp 97.9°F | Resp 16 | Ht 64.0 in | Wt 168.8 lb

## 2024-01-26 DIAGNOSIS — J4550 Severe persistent asthma, uncomplicated: Secondary | ICD-10-CM | POA: Diagnosis not present

## 2024-01-26 MED ORDER — BENRALIZUMAB 30 MG/ML ~~LOC~~ SOSY
30.0000 mg | PREFILLED_SYRINGE | Freq: Once | SUBCUTANEOUS | Status: AC
  Administered 2024-01-26: 30 mg via SUBCUTANEOUS
  Filled 2024-01-26: qty 1

## 2024-01-26 NOTE — Progress Notes (Signed)
 Diagnosis: Asthma  Provider:  Chilton Greathouse MD  Procedure: Injection  Fasenra (Benralizumab), Dose: 30 mg, Site: subcutaneous, Number of injections: 1  Injection Site(s): Right arm  Post Care: Observation period completed  Discharge: Condition: Good, Destination: Home . AVS Declined  Performed by:  Forrest Moron, RN

## 2024-02-12 ENCOUNTER — Other Ambulatory Visit: Payer: Self-pay | Admitting: Obstetrics and Gynecology

## 2024-02-12 DIAGNOSIS — Z1231 Encounter for screening mammogram for malignant neoplasm of breast: Secondary | ICD-10-CM

## 2024-03-22 ENCOUNTER — Ambulatory Visit (INDEPENDENT_AMBULATORY_CARE_PROVIDER_SITE_OTHER): Admitting: *Deleted

## 2024-03-22 ENCOUNTER — Ambulatory Visit

## 2024-03-22 VITALS — BP 114/72 | HR 71 | Temp 97.7°F | Resp 16 | Ht 64.0 in | Wt 168.4 lb

## 2024-03-22 DIAGNOSIS — J4550 Severe persistent asthma, uncomplicated: Secondary | ICD-10-CM | POA: Diagnosis not present

## 2024-03-22 MED ORDER — BENRALIZUMAB 30 MG/ML ~~LOC~~ SOSY
30.0000 mg | PREFILLED_SYRINGE | Freq: Once | SUBCUTANEOUS | Status: AC
  Administered 2024-03-22: 30 mg via SUBCUTANEOUS
  Filled 2024-03-22: qty 1

## 2024-03-22 NOTE — Progress Notes (Signed)
 Diagnosis: Asthma  Provider:  Chilton Greathouse MD  Procedure: Injection  Fasenra (Benralizumab), Dose: 30 mg, Site: subcutaneous, Number of injections: 1  Injection Site(s): Left arm  Post Care: Observation period completed  Discharge: Condition: Good, Destination: Home . AVS Declined  Performed by:  Forrest Moron, RN

## 2024-03-28 ENCOUNTER — Ambulatory Visit
Admission: RE | Admit: 2024-03-28 | Discharge: 2024-03-28 | Disposition: A | Discharge status: Home / Self Care | Source: Ambulatory Visit | Attending: Obstetrics and Gynecology | Admitting: Obstetrics and Gynecology

## 2024-03-28 DIAGNOSIS — Z1231 Encounter for screening mammogram for malignant neoplasm of breast: Secondary | ICD-10-CM

## 2024-05-16 ENCOUNTER — Ambulatory Visit
Admission: RE | Admit: 2024-05-16 | Discharge: 2024-05-16 | Disposition: A | Discharge status: Home / Self Care | Source: Ambulatory Visit | Attending: Family Medicine | Admitting: Family Medicine

## 2024-05-16 ENCOUNTER — Encounter: Payer: Self-pay | Admitting: Family Medicine

## 2024-05-16 ENCOUNTER — Other Ambulatory Visit: Payer: Self-pay | Admitting: Family Medicine

## 2024-05-16 DIAGNOSIS — S065XAA Traumatic subdural hemorrhage with loss of consciousness status unknown, initial encounter: Secondary | ICD-10-CM

## 2024-05-17 ENCOUNTER — Ambulatory Visit

## 2024-05-17 MED ORDER — BENRALIZUMAB 30 MG/ML ~~LOC~~ SOSY: 30.0000 mg | PREFILLED_SYRINGE | Freq: Once | SUBCUTANEOUS | Status: DC

## 2024-05-27 ENCOUNTER — Ambulatory Visit (INDEPENDENT_AMBULATORY_CARE_PROVIDER_SITE_OTHER)

## 2024-05-27 VITALS — BP 135/83 | HR 92 | Temp 98.1°F | Resp 18 | Ht 64.0 in | Wt 173.8 lb

## 2024-05-27 DIAGNOSIS — J4550 Severe persistent asthma, uncomplicated: Secondary | ICD-10-CM

## 2024-05-27 MED ORDER — BENRALIZUMAB 30 MG/ML ~~LOC~~ SOSY
30.0000 mg | PREFILLED_SYRINGE | Freq: Once | SUBCUTANEOUS | Status: AC
  Administered 2024-05-27: 30 mg via SUBCUTANEOUS

## 2024-05-27 NOTE — Progress Notes (Signed)
 Diagnosis: Asthma  Provider:  Mannam, Praveen MD  Procedure: Injection  Fasenra  (Benralizumab ), Dose: 30 mg, Site: subcutaneous, Number of injections: 1  Injection Site(s): Right arm  Post Care: Patient declined observation  Discharge: Condition: Good, Destination: Home . AVS Declined  Performed by:  Rachelle Bue, RN

## 2024-07-19 ENCOUNTER — Other Ambulatory Visit (HOSPITAL_COMMUNITY): Payer: Self-pay | Admitting: Pulmonary Disease

## 2024-07-22 ENCOUNTER — Telehealth: Payer: Self-pay | Admitting: Pharmacy Technician

## 2024-07-22 NOTE — Telephone Encounter (Signed)
 Auth Submission: APPROVED Site of care: Site of care: [facility] Payer: [employer] MED[employer]ARE Medication & CPT/J Code(s) submitted: Fasenra  (Benralizumab ) G9482 Diagnosis Code:  Route of submission (phone, fax, portal): LATENT Phone # Fax # Auth type: Buy/Bill PB Units/visits requested: 30MG  Q8WKS Reference number: [id number] Approval from: 08/01/24 to 07/31/25

## 2024-07-29 ENCOUNTER — Ambulatory Visit

## 2024-07-29 VITALS — BP 127/83 | HR 106 | Temp 97.4°F | Resp 20 | Ht 67.0 in | Wt 183.0 lb

## 2024-07-29 DIAGNOSIS — J4550 Severe persistent asthma, uncomplicated: Secondary | ICD-10-CM

## 2024-07-29 MED ORDER — BENRALIZUMAB 30 MG/ML ~~LOC~~ SOSY
30.0000 mg | PREFILLED_SYRINGE | Freq: Once | SUBCUTANEOUS | Status: AC
  Administered 2024-07-29: 30 mg via SUBCUTANEOUS
  Filled 2024-07-29: qty 1

## 2024-07-29 NOTE — Progress Notes (Signed)
 Diagnosis: , Asthma  Provider:  Mannam, Praveen MD  Procedure: Injection  Fasenra  (Benralizumab ), Dose: 30 mg, Site: subcutaneous, Number of injections: 1  Injection Site(s): Left arm  Post Care:    Discharge: Condition: Good, Destination: Home . AVS Declined  Performed by:  Mysha Peeler, RN

## 2024-08-27 NOTE — Progress Notes (Unsigned)
"   °  °  Cardiology Office Note Date:  08/27/2024  ID:  Margi, Edmundson 03-27-1951, MRN [id number] PCP:  Judyth Isaiah Bottcher, DO  Cardiologist:  Joelle VEAR Ren Donley, MD  No chief complaint on file.    Problems Mild to moderate MR TTE 1/24: 60-65%, mod LAE, mild-mod MR CAC 4/24: 35 (58th) Former tobacco use 10 pack-year SN40  Visits  2/26: SN to RN10, TTE    Discussed the use of AI scribe software for clinical note transcription with the patient, who gave verbal consent to proceed.  History of Present Illness     ROS: Please see the history of present illness. All other systems are reviewed and negative.   PHYSICAL EXAM: VS:  There were no vitals taken for this visit. , BMI There is no height or weight on file to calculate BMI. GEN: Well nourished, well developed, in no acute distress HEENT: normal Neck: no JVD, carotid bruits, or masses Cardiac: ***RRR; no murmurs, rubs, or gallops,no edema  Respiratory:  CTAB bilaterally, normal work of breathing GI: soft, nontender, nondistended, + BS Extremities: No LE edema Skin: warm and dry, no rash Neuro:  Strength and sensation are intact  EKG: ***  Recent Labs: Reviewed  Studies: Reviewed  Assessment and Plan Assessment & Plan      Signed, Joelle VEAR Ren Donley, MD  08/27/2024 4:06 PM    [facility] "

## 2024-09-05 ENCOUNTER — Ambulatory Visit

## 2024-09-05 DIAGNOSIS — I340 Nonrheumatic mitral (valve) insufficiency: Secondary | ICD-10-CM

## 2024-09-05 DIAGNOSIS — Z87891 Personal history of nicotine dependence: Secondary | ICD-10-CM

## 2024-09-05 DIAGNOSIS — I2510 Atherosclerotic heart disease of native coronary artery without angina pectoris: Secondary | ICD-10-CM

## 2024-09-05 DIAGNOSIS — E782 Mixed hyperlipidemia: Secondary | ICD-10-CM

## 2024-09-23 ENCOUNTER — Ambulatory Visit

## 2024-10-04 ENCOUNTER — Ambulatory Visit
